# Patient Record
Sex: Female | Born: 1945
Health system: Southern US, Community
[De-identification: ages and names within clinical notes are randomized; demographics above are authoritative.]

## PROBLEM LIST (undated history)

## (undated) ENCOUNTER — Inpatient Hospital Stay: Discharge status: Absent without leave | Payer: Self-pay

## (undated) DIAGNOSIS — I10 Essential (primary) hypertension: Secondary | ICD-10-CM

## (undated) DIAGNOSIS — E7800 Pure hypercholesterolemia, unspecified: Secondary | ICD-10-CM

## (undated) DIAGNOSIS — N189 Chronic kidney disease, unspecified: Secondary | ICD-10-CM

## (undated) DIAGNOSIS — M810 Age-related osteoporosis without current pathological fracture: Secondary | ICD-10-CM

## (undated) DIAGNOSIS — J449 Chronic obstructive pulmonary disease, unspecified: Secondary | ICD-10-CM

## (undated) HISTORY — PX: ABDOMINAL HYSTERECTOMY: SHX81

## (undated) HISTORY — PX: BREAST BIOPSY: SHX20

## (undated) HISTORY — PX: BUNIONECTOMY: SHX129

## (undated) HISTORY — PX: OTHER SURGICAL HISTORY: SHX169

## (undated) HISTORY — PX: MULTIPLE TOOTH EXTRACTIONS: SHX2053

---

## 1998-09-14 ENCOUNTER — Ambulatory Visit (HOSPITAL_COMMUNITY): Admission: RE | Admit: 1998-09-14 | Discharge: 1998-09-14 | Payer: Self-pay | Admitting: Family Medicine

## 1998-09-17 ENCOUNTER — Ambulatory Visit (HOSPITAL_COMMUNITY): Admission: RE | Admit: 1998-09-17 | Discharge: 1998-09-17 | Payer: Self-pay | Admitting: Family Medicine

## 2000-10-02 ENCOUNTER — Other Ambulatory Visit: Admission: RE | Admit: 2000-10-02 | Discharge: 2000-10-02 | Payer: Self-pay | Admitting: Obstetrics and Gynecology

## 2002-03-25 ENCOUNTER — Other Ambulatory Visit: Admission: RE | Admit: 2002-03-25 | Discharge: 2002-03-25 | Payer: Self-pay | Admitting: Obstetrics and Gynecology

## 2004-06-28 ENCOUNTER — Other Ambulatory Visit: Admission: RE | Admit: 2004-06-28 | Discharge: 2004-06-28 | Payer: Self-pay | Admitting: Obstetrics and Gynecology

## 2004-07-05 ENCOUNTER — Emergency Department (HOSPITAL_COMMUNITY): Admission: EM | Admit: 2004-07-05 | Discharge: 2004-07-05 | Payer: Self-pay | Admitting: Emergency Medicine

## 2005-01-11 ENCOUNTER — Ambulatory Visit (HOSPITAL_COMMUNITY): Admission: RE | Admit: 2005-01-11 | Discharge: 2005-01-11 | Payer: Self-pay | Admitting: Gastroenterology

## 2005-08-31 ENCOUNTER — Other Ambulatory Visit: Admission: RE | Admit: 2005-08-31 | Discharge: 2005-08-31 | Payer: Self-pay | Admitting: Obstetrics and Gynecology

## 2008-09-18 ENCOUNTER — Encounter: Admission: RE | Admit: 2008-09-18 | Discharge: 2008-09-18 | Payer: Self-pay | Admitting: Internal Medicine

## 2011-05-06 NOTE — Op Note (Signed)
NAME:  Katie Jackson, Katie Jackson NO.:  [id number]   MEDICAL RECORD NO.:  [id number]          PATIENT TYPE:  AMB   LOCATION:  ENDO                         FACILITY:  [facility]   PHYSICIAN:  Danise Edge, M.D.   DATE OF BIRTH:  1946-09-19   DATE OF PROCEDURE:  01/11/2005  DATE OF DISCHARGE:                                 OPERATIVE REPORT   PROCEDURE INDICATION:  Ms. Nikaela Coyne is a 65 year old female born 10-11-1946. Ms. Kopecky is scheduled to undergo her first screening colonoscopy  with polypectomy to prevent colon cancer.   MEDICATIONS ALLERGIES:  None.   CHRONIC MEDICATIONS:  Calcium with vitamin D.   PAST MEDICAL HISTORY:  1.  Hypercholesterolemia.  2.  Hypertension.  3.  Osteopenia.  4.  Hysterectomy with bilateral salpingo-oophorectomy.  5.  Appendectomy   FAMILY HISTORY:  Father died prostate cancer age 67. Mother died pancreatic  cancer age 41. Three sisters have thyroid problems. One sister has  hypertension.   HABITS:  Ms. Hantz does not smoke cigarettes and consumes alcohol in  moderation   ENDOSCOPIST:  Danise Edge, M.D.   PREMEDICATION:  Versed 5 mg, Demerol 50 mg.   PROCEDURE:  After obtaining informed consent, Ms. Rebert was placed in the  left lateral decubitus position. I administered intravenous Demerol and  intravenous Versed to achieve conscious sedation for the procedure. The  patient's blood pressure, oxygen saturation and cardiac rhythm were  monitored throughout the procedure and documented in the medical record.   Anal inspection and digital rectal exam were normal. The Olympus adjustable  pediatric colonoscope was introduced into the rectum and advanced to the  cecum. Colonic preparation exam today was excellent.   Rectum:  Normal.   Sigmoid colon and descending colon:  Normal.   Splenic flexure:  Normal.   Transverse colon: Normal.   Hepatic flexure: Normal.   Descending colon:  Normal.   Cecum and ileocecal valve:   Normal.   ASSESSMENT:  Normal screening proctocolonoscopy to the cecum. No endoscopic  evidence for the presence of colorectal neoplasia.      MJ/MEDQ  D:  01/11/2005  T:  01/11/2005  Job:  [id number]   cc:   Georgann Housekeeper, MD  [address]  [address]  [place] [address]  Fax: [phone]

## 2015-06-25 ENCOUNTER — Other Ambulatory Visit: Payer: Self-pay | Admitting: Internal Medicine

## 2015-06-25 DIAGNOSIS — Z1231 Encounter for screening mammogram for malignant neoplasm of breast: Secondary | ICD-10-CM

## 2015-07-27 ENCOUNTER — Ambulatory Visit
Admission: RE | Admit: 2015-07-27 | Discharge: 2015-07-27 | Disposition: A | Discharge status: Home / Self Care | Payer: Medicare Other | Source: Ambulatory Visit | Attending: Internal Medicine | Admitting: Internal Medicine

## 2015-07-27 DIAGNOSIS — Z1231 Encounter for screening mammogram for malignant neoplasm of breast: Secondary | ICD-10-CM

## 2015-07-29 ENCOUNTER — Other Ambulatory Visit: Payer: Self-pay | Admitting: Internal Medicine

## 2015-07-29 DIAGNOSIS — R928 Other abnormal and inconclusive findings on diagnostic imaging of breast: Secondary | ICD-10-CM

## 2015-08-12 ENCOUNTER — Other Ambulatory Visit: Payer: Self-pay | Admitting: Gastroenterology

## 2015-08-14 ENCOUNTER — Ambulatory Visit
Admission: RE | Admit: 2015-08-14 | Discharge: 2015-08-14 | Disposition: A | Discharge status: Home / Self Care | Payer: Medicare Other | Source: Ambulatory Visit | Attending: Internal Medicine | Admitting: Internal Medicine

## 2015-08-14 ENCOUNTER — Other Ambulatory Visit: Payer: Self-pay | Admitting: Internal Medicine

## 2015-08-14 DIAGNOSIS — R928 Other abnormal and inconclusive findings on diagnostic imaging of breast: Secondary | ICD-10-CM

## 2015-08-19 ENCOUNTER — Other Ambulatory Visit: Payer: Self-pay | Admitting: Internal Medicine

## 2015-08-19 DIAGNOSIS — R928 Other abnormal and inconclusive findings on diagnostic imaging of breast: Secondary | ICD-10-CM

## 2015-08-20 ENCOUNTER — Ambulatory Visit
Admission: RE | Admit: 2015-08-20 | Discharge: 2015-08-20 | Disposition: A | Discharge status: Home / Self Care | Payer: Medicare Other | Source: Ambulatory Visit | Attending: Internal Medicine | Admitting: Internal Medicine

## 2015-08-20 DIAGNOSIS — R928 Other abnormal and inconclusive findings on diagnostic imaging of breast: Secondary | ICD-10-CM

## 2015-10-13 ENCOUNTER — Ambulatory Visit (HOSPITAL_COMMUNITY): Admission: RE | Admit: 2015-10-13 | Payer: Medicare Other | Source: Ambulatory Visit | Admitting: Gastroenterology

## 2015-10-13 ENCOUNTER — Encounter (HOSPITAL_COMMUNITY): Admission: RE | Payer: Self-pay | Source: Ambulatory Visit

## 2015-10-13 SURGERY — COLONOSCOPY WITH PROPOFOL
Anesthesia: Monitor Anesthesia Care

## 2016-03-08 ENCOUNTER — Other Ambulatory Visit: Payer: Self-pay | Admitting: Internal Medicine

## 2016-03-08 DIAGNOSIS — N63 Unspecified lump in unspecified breast: Secondary | ICD-10-CM

## 2016-03-14 ENCOUNTER — Ambulatory Visit
Admission: RE | Admit: 2016-03-14 | Discharge: 2016-03-14 | Disposition: A | Discharge status: Home / Self Care | Payer: Medicare Other | Source: Ambulatory Visit | Attending: Internal Medicine | Admitting: Internal Medicine

## 2016-03-14 DIAGNOSIS — N63 Unspecified lump in unspecified breast: Secondary | ICD-10-CM

## 2016-05-18 ENCOUNTER — Other Ambulatory Visit: Payer: Self-pay | Admitting: Gastroenterology

## 2016-06-23 ENCOUNTER — Other Ambulatory Visit: Payer: Self-pay | Admitting: Nurse Practitioner

## 2016-06-23 ENCOUNTER — Ambulatory Visit
Admission: RE | Admit: 2016-06-23 | Discharge: 2016-06-23 | Disposition: A | Discharge status: Home / Self Care | Payer: Medicare Other | Source: Ambulatory Visit | Attending: Nurse Practitioner | Admitting: Nurse Practitioner

## 2016-06-23 DIAGNOSIS — M25561 Pain in right knee: Secondary | ICD-10-CM

## 2016-08-02 ENCOUNTER — Encounter (HOSPITAL_COMMUNITY): Admission: RE | Payer: Self-pay | Source: Ambulatory Visit

## 2016-08-02 ENCOUNTER — Ambulatory Visit (HOSPITAL_COMMUNITY): Admission: RE | Admit: 2016-08-02 | Payer: Medicare Other | Source: Ambulatory Visit | Admitting: Gastroenterology

## 2016-08-02 SURGERY — COLONOSCOPY WITH PROPOFOL
Anesthesia: Monitor Anesthesia Care

## 2016-08-30 ENCOUNTER — Other Ambulatory Visit: Payer: Self-pay | Admitting: Internal Medicine

## 2016-08-30 ENCOUNTER — Ambulatory Visit
Admission: RE | Admit: 2016-08-30 | Discharge: 2016-08-30 | Disposition: A | Discharge status: Home / Self Care | Payer: Medicare Other | Source: Ambulatory Visit | Attending: Internal Medicine | Admitting: Internal Medicine

## 2016-08-30 DIAGNOSIS — M545 Low back pain: Secondary | ICD-10-CM

## 2017-11-20 DIAGNOSIS — N390 Urinary tract infection, site not specified: Secondary | ICD-10-CM | POA: Diagnosis not present

## 2018-03-14 DIAGNOSIS — H2513 Age-related nuclear cataract, bilateral: Secondary | ICD-10-CM | POA: Diagnosis not present

## 2018-03-14 DIAGNOSIS — B029 Zoster without complications: Secondary | ICD-10-CM | POA: Diagnosis not present

## 2018-03-26 DIAGNOSIS — H2513 Age-related nuclear cataract, bilateral: Secondary | ICD-10-CM | POA: Diagnosis not present

## 2018-03-26 DIAGNOSIS — H5703 Miosis: Secondary | ICD-10-CM | POA: Diagnosis not present

## 2018-04-02 DIAGNOSIS — H2511 Age-related nuclear cataract, right eye: Secondary | ICD-10-CM | POA: Diagnosis not present

## 2018-04-12 DIAGNOSIS — H2512 Age-related nuclear cataract, left eye: Secondary | ICD-10-CM | POA: Diagnosis not present

## 2018-04-16 DIAGNOSIS — H2512 Age-related nuclear cataract, left eye: Secondary | ICD-10-CM | POA: Diagnosis not present

## 2018-05-07 DIAGNOSIS — F0390 Unspecified dementia without behavioral disturbance: Secondary | ICD-10-CM | POA: Diagnosis not present

## 2018-05-07 DIAGNOSIS — I10 Essential (primary) hypertension: Secondary | ICD-10-CM | POA: Diagnosis not present

## 2018-05-07 DIAGNOSIS — E782 Mixed hyperlipidemia: Secondary | ICD-10-CM | POA: Diagnosis not present

## 2018-05-07 DIAGNOSIS — N183 Chronic kidney disease, stage 3 (moderate): Secondary | ICD-10-CM | POA: Diagnosis not present

## 2018-05-08 ENCOUNTER — Other Ambulatory Visit: Payer: Self-pay | Admitting: Internal Medicine

## 2018-05-08 DIAGNOSIS — F0390 Unspecified dementia without behavioral disturbance: Secondary | ICD-10-CM

## 2018-05-10 ENCOUNTER — Ambulatory Visit
Admission: RE | Admit: 2018-05-10 | Discharge: 2018-05-10 | Disposition: A | Discharge status: Home / Self Care | Payer: Medicare Other | Source: Ambulatory Visit | Attending: Internal Medicine | Admitting: Internal Medicine

## 2018-05-10 DIAGNOSIS — F0390 Unspecified dementia without behavioral disturbance: Secondary | ICD-10-CM

## 2018-05-10 DIAGNOSIS — R413 Other amnesia: Secondary | ICD-10-CM | POA: Diagnosis not present

## 2018-05-22 DIAGNOSIS — N390 Urinary tract infection, site not specified: Secondary | ICD-10-CM | POA: Diagnosis not present

## 2018-05-22 DIAGNOSIS — R413 Other amnesia: Secondary | ICD-10-CM | POA: Diagnosis not present

## 2018-05-23 ENCOUNTER — Encounter: Payer: Self-pay | Admitting: Neurology

## 2018-06-10 ENCOUNTER — Other Ambulatory Visit: Payer: Self-pay

## 2018-06-10 ENCOUNTER — Encounter (HOSPITAL_COMMUNITY): Payer: Self-pay | Admitting: Emergency Medicine

## 2018-06-10 ENCOUNTER — Emergency Department (HOSPITAL_COMMUNITY)
Admission: EM | Admit: 2018-06-10 | Discharge: 2018-06-10 | Disposition: A | Discharge status: Home / Self Care | Payer: Medicare Other | Attending: Emergency Medicine | Admitting: Emergency Medicine

## 2018-06-10 DIAGNOSIS — Z7982 Long term (current) use of aspirin: Secondary | ICD-10-CM | POA: Insufficient documentation

## 2018-06-10 DIAGNOSIS — I10 Essential (primary) hypertension: Secondary | ICD-10-CM | POA: Diagnosis not present

## 2018-06-10 DIAGNOSIS — R21 Rash and other nonspecific skin eruption: Secondary | ICD-10-CM | POA: Diagnosis not present

## 2018-06-10 DIAGNOSIS — Z87891 Personal history of nicotine dependence: Secondary | ICD-10-CM | POA: Insufficient documentation

## 2018-06-10 DIAGNOSIS — Z79899 Other long term (current) drug therapy: Secondary | ICD-10-CM | POA: Diagnosis not present

## 2018-06-10 HISTORY — DX: Essential (primary) hypertension: I10

## 2018-06-10 HISTORY — DX: Pure hypercholesterolemia, unspecified: E78.00

## 2018-06-10 MED ORDER — PREDNISONE 10 MG (21) PO TBPK: ORAL_TABLET | Freq: Every day | ORAL | 0 refills | Status: DC

## 2018-06-10 NOTE — ED Provider Notes (Signed)
[facility] EMERGENCY DEPARTMENT Provider Note   CSN: [id number] Arrival date & time: 06/10/18  1510     History   Chief Complaint Chief Complaint  Patient presents with  . Rash    HPI Katie Jackson is a 72 y.o. female.  72yo female presents with rash to the right side face, trunk, bilateral arms and 3rd finger, onset yesterday. Patient states the areas appear as small fluid filled bumps that open/drain and dry up. Reports pain and itching associated with the rash.  Patient has not tried putting anything on her rash or taking anything for it.  Denies any changes to her medications or detergents.  Patient lives in an apartment and does not have pets, denies exposure to plants.  No other complaints or concerns.  Patient's family member is here to assist her with her history, reports patient has dementia and may not be certain on all the details of her presentation today.     Past Medical History:  Diagnosis Date  . High cholesterol   . Hypertension     There are no active problems to display for this patient.   Past Surgical History:  Procedure Laterality Date  . cataract Bilateral      OB History   None      Home Medications    Prior to Admission medications   Medication Sig Start Date End Date Taking? Authorizing Provider  aspirin 325 MG tablet Take 325 mg by mouth daily as needed for headache.    [provider]  Calcium Carb-Cholecalciferol (CALCIUM 600/VITAMIN D3 PO) Take 1 tablet by mouth daily.    [provider]  lisinopril (PRINIVIL,ZESTRIL) 20 MG tablet Take 20 mg by mouth every morning.    [provider]  lovastatin (MEVACOR) 20 MG tablet Take 20 mg by mouth at bedtime.    [provider]  predniSONE (STERAPRED UNI-PAK 21 TAB) 10 MG (21) TBPK tablet Take by mouth daily. Take 6 tabs by mouth daily  for 2 days, then 5 tabs for 2 days, then 4 tabs for 2 days, then 3 tabs for 2 days, 2 tabs for 2 days,  then 1 tab by mouth daily for 2 days 06/10/18   Tacy Learn, PA-C    Family History No family history on file.  Social History Social History   Tobacco Use  . Smoking status: Former Research scientist (life sciences)  . Smokeless tobacco: Never Used  Substance Use Topics  . Alcohol use: Not Currently  . Drug use: Never     Allergies   Patient has no known allergies.   Review of Systems Review of Systems  Constitutional: Negative for fever.  Eyes: Negative for pain.  Respiratory: Negative for shortness of breath.   Cardiovascular: Negative for chest pain.  Gastrointestinal: Negative for abdominal pain and vomiting.  Musculoskeletal: Negative for arthralgias, joint swelling and myalgias.  Skin: Positive for rash. Negative for wound.  Allergic/Immunologic: Negative for immunocompromised state.  Neurological: Negative for dizziness and weakness.  Hematological: Negative for adenopathy. Does not bruise/bleed easily.  All other systems reviewed and are negative.    Physical Exam Updated Vital Signs BP (!) 144/69 (BP Location: Right Arm)   Pulse 63   Temp 98.3 F (36.8 C) (Oral)   Resp 16   Ht 5\' 3"  (1.6 m)   Wt 54.9 kg (121 lb)   SpO2 98%   BMI 21.43 kg/m   Physical Exam  Constitutional: She is oriented to person, place, and  time. She appears well-developed and well-nourished. No distress.  HENT:  Head: Normocephalic and atraumatic.  Right Ear: External ear normal.  Left Ear: External ear normal.  Mouth/Throat: Oropharynx is clear and moist.  Eyes: Conjunctivae are normal.  Neck: Neck supple.  Cardiovascular: Normal rate, regular rhythm, normal heart sounds and intact distal pulses.  No murmur heard. Pulmonary/Chest: Effort normal and breath sounds normal. No respiratory distress.  Musculoskeletal: She exhibits no edema or tenderness.  Neurological: She is alert and oriented to person, place, and time.  Skin: Skin is warm and dry. Rash noted. She is not diaphoretic.  Maculopapular  rash to right side of face.  There are a few papules noted to her trunk.  Vesicles to right forearm and a cluster.  Vesicles noted to left third finger.  Excoriated lesions noted to left forearm.  Psychiatric: She has a normal mood and affect. Her behavior is normal.  Nursing note and vitals reviewed.    ED Treatments / Results  Labs (all labs ordered are listed, but only abnormal results are displayed) Labs Reviewed - No data to display  EKG None  Radiology No results found.  Procedures Procedures (including critical care time)  Medications Ordered in ED Medications - No data to display   Initial Impression / Assessment and Plan / ED Course  I have reviewed the triage vital signs and the nursing notes.  Pertinent labs & imaging results that were available during my care of the patient were reviewed by me and considered in my medical decision making (see chart for details).  Clinical Course as of Jun 10 1644  Sun Jun 11, 6143  5744 72 year old female presents with complaint of rash onset yesterday.  Patient associates itching and pain with the rash.  No identifiable causes to her rash.  On exam she has a maculopapular rash with occasional vesicles.  Rash does not appear consistent with shingles due to distribution.  Consider contact dermatitis.  Patient will be discharged home with prednisone Dosepak.  Recommend that she recheck with her PCP or dermatology this week.  She is Artie planning to see dermatology for a concerning skin lesion on her chest.   [LM]    Clinical Course User Index [LM] Tacy Learn, PA-C    Final Clinical Impressions(s) / ED Diagnoses   Final diagnoses:  Rash    ED Discharge Orders        Ordered    predniSONE (STERAPRED UNI-PAK 21 TAB) 10 MG (21) TBPK tablet  Daily     06/10/18 1615       Tacy Learn, PA-C 06/10/18 1645    Davonna Belling, MD 06/12/18 1450

## 2018-06-10 NOTE — ED Triage Notes (Signed)
C/o rash to R side of face, neck, and bilateral arms since waking up around 2pm.  Swelling to R side of face.  States she had a rash 1 month ago that looked different and took antibiotic and it resolved.

## 2018-06-10 NOTE — Discharge Instructions (Addendum)
Take prednisone as prescribed and complete the full course.  Follow-up with your primary care doctor this week for recheck.  Schedule an appointment with your dermatologist as discussed.  Return to the ER if symptoms worsen or progress.

## 2018-06-13 DIAGNOSIS — R238 Other skin changes: Secondary | ICD-10-CM | POA: Diagnosis not present

## 2018-06-13 DIAGNOSIS — L989 Disorder of the skin and subcutaneous tissue, unspecified: Secondary | ICD-10-CM | POA: Diagnosis not present

## 2018-06-13 DIAGNOSIS — R21 Rash and other nonspecific skin eruption: Secondary | ICD-10-CM | POA: Diagnosis not present

## 2018-06-18 DIAGNOSIS — D225 Melanocytic nevi of trunk: Secondary | ICD-10-CM | POA: Diagnosis not present

## 2018-06-18 DIAGNOSIS — L818 Other specified disorders of pigmentation: Secondary | ICD-10-CM | POA: Diagnosis not present

## 2018-06-18 DIAGNOSIS — B0089 Other herpesviral infection: Secondary | ICD-10-CM | POA: Diagnosis not present

## 2018-06-18 DIAGNOSIS — C44529 Squamous cell carcinoma of skin of other part of trunk: Secondary | ICD-10-CM | POA: Diagnosis not present

## 2018-06-18 DIAGNOSIS — L820 Inflamed seborrheic keratosis: Secondary | ICD-10-CM | POA: Diagnosis not present

## 2018-07-16 DIAGNOSIS — Z08 Encounter for follow-up examination after completed treatment for malignant neoplasm: Secondary | ICD-10-CM | POA: Diagnosis not present

## 2018-07-16 DIAGNOSIS — Z85828 Personal history of other malignant neoplasm of skin: Secondary | ICD-10-CM | POA: Diagnosis not present

## 2018-07-16 DIAGNOSIS — L821 Other seborrheic keratosis: Secondary | ICD-10-CM | POA: Diagnosis not present

## 2018-08-02 ENCOUNTER — Encounter: Payer: Self-pay | Admitting: Neurology

## 2018-08-02 ENCOUNTER — Ambulatory Visit (INDEPENDENT_AMBULATORY_CARE_PROVIDER_SITE_OTHER): Payer: Medicare HMO | Admitting: Neurology

## 2018-08-02 ENCOUNTER — Other Ambulatory Visit: Payer: Self-pay

## 2018-08-02 VITALS — BP 98/56 | HR 79 | Ht 62.0 in | Wt 114.0 lb

## 2018-08-02 DIAGNOSIS — G301 Alzheimer's disease with late onset: Secondary | ICD-10-CM | POA: Diagnosis not present

## 2018-08-02 DIAGNOSIS — F0281 Dementia in other diseases classified elsewhere with behavioral disturbance: Secondary | ICD-10-CM | POA: Diagnosis not present

## 2018-08-02 DIAGNOSIS — F0280 Dementia in other diseases classified elsewhere without behavioral disturbance: Secondary | ICD-10-CM

## 2018-08-02 DIAGNOSIS — F02818 Dementia in other diseases classified elsewhere, unspecified severity, with other behavioral disturbance: Secondary | ICD-10-CM

## 2018-08-02 HISTORY — DX: Dementia in other diseases classified elsewhere, unspecified severity, without behavioral disturbance, psychotic disturbance, mood disturbance, and anxiety: F02.80

## 2018-08-02 MED ORDER — ESCITALOPRAM OXALATE 10 MG PO TABS: 10.0000 mg | ORAL_TABLET | Freq: Every day | ORAL | 11 refills | Status: DC

## 2018-08-02 MED ORDER — DONEPEZIL HCL 10 MG PO TABS: 10.0000 mg | ORAL_TABLET | Freq: Every day | ORAL | 11 refills | Status: DC

## 2018-08-02 NOTE — Progress Notes (Signed)
NEUROLOGY CONSULTATION NOTE  Katie Jackson MRN: [id number] DOB: 1946-09-25  Referring provider: Dr. Wenda Jackson Primary care provider: Dr. Wenda Jackson  Reason for consult:  dementia  Dear Dr Katie Jackson:  Thank you for your kind referral of Katie Jackson for consultation of the above symptoms. Although her history is well known to you, please allow me to reiterate it for the purpose of our medical record. The patient was accompanied to the clinic by her daughter and sister who also provide collateral information. Records and images were personally reviewed where available.  HISTORY OF PRESENT ILLNESS: This is a 72 year old right-handed woman with a history of hypertension, hyperlipidemia, presenting for evaluation of dementia. She thinks her memory is "pretty well." Her daughter started noticing memory changes a couple of years ago, worse the past year. She lives alone in a senior citizen apartment complex. Her daughter reports difficulties with medications. She was having recurrent UTIs, she said she was taking them, but when she had another UTI in June 2019, her daughter found a full bottle of antibiotic from December 2018. Her daughter started fixing her pillbox but she still does not take it right. Her daughter reports that she has been a victim of several scams, she has joined [facility] and does not know what the policy is for. She joined [employer], her daughter discontinued the membership, then she joined again. She was forgetting to pay her bills and then overpaying 2 months ago. She has mail everywhere. Her daughter has been trying to help her for the past 2-3 months but she would fight her daughter. Her daughter is now POA. She does not cook and goes out twice a day to buy food. She continues to drive and denies getting lost driving. She drives for her neighbors and denies any car accidents. 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 She states she is not doing this anymore. She is independent with dressing and bathing but she kept having a rash on her face, which they think is due to not bathing and washing her hair. She hangs dirty clothes back in the closet. She has been putting on her eyeliner thickly on her lower lid for the past 2 years, which her family reports is a change. Her daughter has also noticed increased irritability, she can get agitated/aggressive quickly, which is not typical for her. No paranoia or hallucinations. No family history of dementia. She denies any history of significant head injuries or alcohol use. She is taking Donepezil 5mg  daily without side effects. She denies any headaches, dizziness, diplopia, dysarthria, dysphagia, neck/back pain, focal numbness/tingling/weakness, bowel/bladder dysfunction. No anosmia, tremors, no falls.   She had a head CT without contrast done 04/2018 which did not show any acute changes. There was mild diffuse atrophy and bilateral hippocampal atrophy noted.   PAST MEDICAL HISTORY: Past Medical History:  Diagnosis Date  . High cholesterol   . Hypertension     PAST SURGICAL HISTORY: Past Surgical History:  Procedure Laterality Date  . cataract Bilateral     MEDICATIONS: Current Outpatient Medications on File Prior to Visit  Medication Sig Dispense Refill  . Calcium Carb-Cholecalciferol (CALCIUM 600/VITAMIN D3 PO) Take 1 tablet by mouth daily.    [name] donepezil (ARICEPT) 5 MG tablet Take 5 mg by mouth daily.    [name] lisinopril (PRINIVIL,ZESTRIL) 20 MG tablet  Take 20 mg by mouth every morning.    . lovastatin (MEVACOR) 20 MG tablet Take 20 mg by mouth at bedtime.    . valACYclovir (VALTREX) 1000 MG tablet Take 1,000 mg by mouth as needed (as needed for facial breakout).     No current facility-administered medications on file prior to visit.     ALLERGIES: No Known Allergies  FAMILY HISTORY: No family history on  file.  SOCIAL HISTORY: Social History   Socioeconomic History  . Marital status: Single    Spouse name: Not on file  . Number of children: Not on file  . Years of education: Not on file  . Highest education level: Not on file  Occupational History  . Not on file  Social Needs  . Financial resource strain: Not on file  . Food insecurity:    Worry: Not on file    Inability: Not on file  . Transportation needs:    Medical: Not on file    Non-medical: Not on file  Tobacco Use  . Smoking status: Former Research scientist (life sciences)  . Smokeless tobacco: Never Used  Substance and Sexual Activity  . Alcohol use: Not Currently  . Drug use: Never  . Sexual activity: Not on file  Lifestyle  . Physical activity:    Days per week: Not on file    Minutes per session: Not on file  . Stress: Not on file  Relationships  . Social connections:    Talks on phone: Not on file    Gets together: Not on file    Attends religious service: Not on file    Active member of club or organization: Not on file    Attends meetings of clubs or organizations: Not on file    Relationship status: Not on file  . Intimate partner violence:    Fear of current or ex partner: Not on file    Emotionally abused: Not on file    Physically abused: Not on file    Forced sexual activity: Not on file  Other Topics Concern  . 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     REVIEW OF SYSTEMS: Constitutional: No fevers, chills, or sweats, no generalized fatigue, change in appetite Eyes: No visual changes, double vision, eye pain Ear, nose and throat: No hearing loss, ear pain, nasal congestion, sore throat Cardiovascular: No chest pain, palpitations Respiratory:  No shortness of breath at rest or with exertion, wheezes GastrointestinaI: No nausea, vomiting, diarrhea, abdominal pain, fecal  incontinence Genitourinary:  No dysuria, urinary retention or frequency Musculoskeletal:  No neck pain, back pain Integumentary: No rash, pruritus, skin lesions Neurological: as above Psychiatric: No depression, insomnia, anxiety Endocrine: No palpitations, fatigue, diaphoresis, mood swings, change in appetite, change in weight, increased thirst Hematologic/Lymphatic:  No anemia, purpura, petechiae. Allergic/Immunologic: no itchy/runny eyes, nasal congestion, recent allergic reactions, rashes  PHYSICAL EXAM: Vitals:   08/02/18 1038  BP: (!) 98/56  Pulse: 79  SpO2: 98%   General: No acute distress Head:  Normocephalic/atraumatic Eyes: Fundoscopic exam shows bilateral sharp discs, no vessel changes, exudates, or hemorrhages Neck: supple, no paraspinal tenderness, full range of motion Back: No paraspinal tenderness Heart: regular rate and rhythm Lungs: Clear to auscultation bilaterally. Vascular: No carotid bruits. Skin/Extremities: No rash, no edema Neurological Exam: Mental status: alert and oriented to person, place, and  time, no dysarthria or aphasia, Fund of knowledge is appropriate.  Recent and remote memory are impaired.  Attention and concentration are normal.    Able to name objects and repeat phrases.  Montreal Cognitive Assessment  08/02/2018  Visuospatial/ Executive (0/5) 5  Naming (0/3) 3  Attention: Read list of digits (0/2) 2  Attention: Read list of letters (0/1) 1  Attention: Serial 7 subtraction starting at 100 (0/3) 3  Language: Repeat phrase (0/2) 2  Language : Fluency (0/1) 0  Abstraction (0/2) 2  Delayed Recall (0/5) 3  Orientation (0/6) 6  Total 27   Cranial nerves: CN I: not tested CN II: pupils equal, round and reactive to light, visual fields intact, fundi unremarkable. CN III, IV, VI:  full range of motion, no nystagmus, no ptosis CN V: facial sensation intact CN VII: upper and lower face symmetric CN VIII: hearing intact to finger rub CN IX, X:  gag intact, uvula midline CN XI: sternocleidomastoid and trapezius muscles intact CN XII: tongue midline Bulk & Tone: normal, no fasciculations. Motor: 5/5 throughout with no pronator drift. Sensation: intact to light touch, cold, pin, vibration and joint position sense.  No extinction to double simultaneous stimulation.  Romberg test negative Deep Tendon Reflexes: +2 throughout, no ankle clonus Plantar responses: downgoing bilaterally Cerebellar: no incoordination on finger to nose, heel to shin. No dysdiadochokinesia Gait: narrow-based and steady, able to tandem walk adequately. Tremor: none  IMPRESSION: This is a 72 year old right-handed woman with a history of hypertension, hyperlipidemia, presenting for evaluation of worsening memory. She did quite well with MOCA testing, 27/30, however history suggestive of mild Alzheimer's disease. Brain imaging shows bilateral hippocampal atrophy. We discussed the diagnosis and increasing Donepezil to 10mg  daily. We also discussed behavioral changes that occur with dementia, she is agreeable to starting Lexapro 10mg  daily, side effects discussed. We had an extensive discussion about no further driving, which she was understandable unhappy about. She was encouraged to start looking into day programs and having a home aide to help with medications and hygiene/meals. We discussed the importance of control of vascular risk factors, physical exercise, and brain stimulation exercises for brain health. Follow-up in 6 months, they know to call for any changes.   Thank you for allowing me to participate in the care of this patient. Please do not hesitate to call for any questions or concerns.   Ellouise Newer, M.D.  CC: Dr. Lysle Jackson

## 2018-08-02 NOTE — Patient Instructions (Addendum)
1. Increase Donepezil to 10mg  daily 2. Start Lexapro 10mg  daily 3. We will get ou connected with [employer] through the [employer] to help with local resources for home aide and adult day programs 4. No further driving 5. Follow-up in 6 months, call for any changes  FALL PRECAUTIONS: Be cautious when walking. Scan the area for obstacles that may increase the risk of trips and falls. When getting up in the mornings, sit up at the edge of the bed for a few minutes before getting out of bed. Consider elevating the bed at the head end to avoid drop of blood pressure when getting up. Walk always in a well-lit room (use night lights in the walls). Avoid area rugs or power cords from appliances in the middle of the walkways. Use a walker or a cane if necessary and consider physical therapy for balance exercise. Get your eyesight checked regularly.  FINANCIAL OVERSIGHT: Supervision, especially oversight when making financial decisions or transactions is also recommended.  HOME SAFETY: Consider the safety of the kitchen when operating appliances like stoves, microwave oven, and blender. Consider having supervision and share cooking responsibilities until no longer able to participate in those. Accidents with firearms and other hazards in the house should be identified and addressed as well.  ABILITY TO BE LEFT ALONE: If patient is unable to contact 911 operator, consider using [employer], or when the need is there, arrange for someone to stay with patients. Smoking is a fire hazard, consider supervision or cessation. Risk of wandering should be assessed by caregiver and if detected at any point, supervision and safe proof recommendations should be instituted.  MEDICATION SUPERVISION: Inability to self-administer medication needs to be constantly addressed. Implement a mechanism to ensure safe administration of the medications.  RECOMMENDATIONS FOR ALL PATIENTS WITH MEMORY PROBLEMS: 1. Continue  to exercise (Recommend 30 minutes of walking everyday, or 3 hours every week) 2. Increase social interactions - continue going to [place] and enjoy social gatherings with friends and family 3. Eat healthy, avoid fried foods and eat more fruits and vegetables 4. Maintain adequate blood pressure, blood sugar, and blood cholesterol level. Reducing the risk of stroke and cardiovascular disease also helps promoting better memory. 5. Avoid stressful situations. Live a simple life and avoid aggravations. Organize your time and prepare for the next day in anticipation. 6. Sleep well, avoid any interruptions of sleep and avoid any distractions in the bedroom that may interfere with adequate sleep quality 7. Avoid sugar, avoid sweets as there is a strong link between excessive sugar intake, diabetes, and cognitive impairment The Mediterranean diet has been shown to help patients reduce the risk of progressive memory disorders and reduces cardiovascular risk. This includes eating fish, eat fruits and green leafy vegetables, nuts like almonds and hazelnuts, walnuts, and also use olive oil. Avoid fast foods and fried foods as much as possible. Avoid sweets and sugar as sugar use has been linked to worsening of memory function.  There is always a concern of gradual progression of memory problems. If this is the case, then we may need to adjust level of care according to patient needs. Support, both to the patient and caregiver, should then be put into place.

## 2018-08-09 ENCOUNTER — Encounter: Payer: Self-pay | Admitting: Neurology

## 2018-11-05 ENCOUNTER — Emergency Department (HOSPITAL_COMMUNITY): Payer: Medicare Other

## 2018-11-05 ENCOUNTER — Other Ambulatory Visit: Payer: Self-pay

## 2018-11-05 ENCOUNTER — Encounter (HOSPITAL_COMMUNITY): Payer: Self-pay | Admitting: *Deleted

## 2018-11-05 ENCOUNTER — Emergency Department (HOSPITAL_COMMUNITY)
Admission: EM | Admit: 2018-11-05 | Discharge: 2018-11-05 | Disposition: A | Discharge status: Home / Self Care | Payer: Medicare Other | Attending: Emergency Medicine | Admitting: Emergency Medicine

## 2018-11-05 DIAGNOSIS — Z79899 Other long term (current) drug therapy: Secondary | ICD-10-CM | POA: Diagnosis not present

## 2018-11-05 DIAGNOSIS — Y998 Other external cause status: Secondary | ICD-10-CM | POA: Diagnosis not present

## 2018-11-05 DIAGNOSIS — Y92009 Unspecified place in unspecified non-institutional (private) residence as the place of occurrence of the external cause: Secondary | ICD-10-CM

## 2018-11-05 DIAGNOSIS — F0390 Unspecified dementia without behavioral disturbance: Secondary | ICD-10-CM | POA: Diagnosis not present

## 2018-11-05 DIAGNOSIS — Z87891 Personal history of nicotine dependence: Secondary | ICD-10-CM | POA: Insufficient documentation

## 2018-11-05 DIAGNOSIS — W19XXXA Unspecified fall, initial encounter: Secondary | ICD-10-CM | POA: Insufficient documentation

## 2018-11-05 DIAGNOSIS — Y92019 Unspecified place in single-family (private) house as the place of occurrence of the external cause: Secondary | ICD-10-CM | POA: Insufficient documentation

## 2018-11-05 DIAGNOSIS — R42 Dizziness and giddiness: Secondary | ICD-10-CM | POA: Insufficient documentation

## 2018-11-05 DIAGNOSIS — Y9389 Activity, other specified: Secondary | ICD-10-CM | POA: Diagnosis not present

## 2018-11-05 DIAGNOSIS — M25561 Pain in right knee: Secondary | ICD-10-CM | POA: Insufficient documentation

## 2018-11-05 DIAGNOSIS — S0990XA Unspecified injury of head, initial encounter: Secondary | ICD-10-CM | POA: Diagnosis present

## 2018-11-05 DIAGNOSIS — I10 Essential (primary) hypertension: Secondary | ICD-10-CM | POA: Diagnosis not present

## 2018-11-05 DIAGNOSIS — N390 Urinary tract infection, site not specified: Secondary | ICD-10-CM | POA: Diagnosis not present

## 2018-11-05 LAB — CBC
HEMATOCRIT: 42.9 % (ref 36.0–46.0)
Hemoglobin: 13.4 g/dL (ref 12.0–15.0)
MCH: 27.9 pg (ref 26.0–34.0)
MCHC: 31.2 g/dL (ref 30.0–36.0)
MCV: 89.4 fL (ref 80.0–100.0)
NRBC: 0 % (ref 0.0–0.2)
Platelets: 184 10*3/uL (ref 150–400)
RBC: 4.8 MIL/uL (ref 3.87–5.11)
RDW: 12.1 % (ref 11.5–15.5)
WBC: 5.2 10*3/uL (ref 4.0–10.5)

## 2018-11-05 LAB — DIFFERENTIAL
Abs Immature Granulocytes: 0.01 10*3/uL (ref 0.00–0.07)
Basophils Absolute: 0.1 10*3/uL (ref 0.0–0.1)
Basophils Relative: 1 %
Eosinophils Absolute: 0 10*3/uL (ref 0.0–0.5)
Eosinophils Relative: 1 %
Immature Granulocytes: 0 %
Lymphocytes Relative: 32 %
Lymphs Abs: 1.7 10*3/uL (ref 0.7–4.0)
Monocytes Absolute: 0.4 10*3/uL (ref 0.1–1.0)
Monocytes Relative: 7 %
Neutro Abs: 3.1 10*3/uL (ref 1.7–7.7)
Neutrophils Relative %: 59 %

## 2018-11-05 LAB — URINALYSIS, ROUTINE W REFLEX MICROSCOPIC
Bilirubin Urine: NEGATIVE
Glucose, UA: NEGATIVE mg/dL
Ketones, ur: 20 mg/dL — AB
Nitrite: POSITIVE — AB
Protein, ur: NEGATIVE mg/dL
Specific Gravity, Urine: 1.014 (ref 1.005–1.030)
pH: 6 (ref 5.0–8.0)

## 2018-11-05 LAB — COMPREHENSIVE METABOLIC PANEL
ALT: 11 U/L (ref 0–44)
AST: 21 U/L (ref 15–41)
Albumin: 4.2 g/dL (ref 3.5–5.0)
Alkaline Phosphatase: 43 U/L (ref 38–126)
Anion gap: 8 (ref 5–15)
BUN: 11 mg/dL (ref 8–23)
CO2: 27 mmol/L (ref 22–32)
Calcium: 9.6 mg/dL (ref 8.9–10.3)
Chloride: 103 mmol/L (ref 98–111)
Creatinine, Ser: 1.07 mg/dL — ABNORMAL HIGH (ref 0.44–1.00)
GFR calc Af Amer: 58 mL/min — ABNORMAL LOW (ref >60)
GFR calc non Af Amer: 50 mL/min — ABNORMAL LOW (ref >60)
Glucose, Bld: 93 mg/dL (ref 70–99)
Potassium: 3.8 mmol/L (ref 3.5–5.1)
Sodium: 138 mmol/L (ref 135–145)
Total Bilirubin: 0.8 mg/dL (ref 0.3–1.2)
Total Protein: 6.9 g/dL (ref 6.5–8.1)

## 2018-11-05 LAB — I-STAT TROPONIN, ED: Troponin i, poc: 0.01 ng/mL (ref 0.00–0.08)

## 2018-11-05 MED ORDER — CEPHALEXIN 250 MG PO CAPS: 250.0000 mg | ORAL_CAPSULE | Freq: Four times a day (QID) | ORAL | 0 refills | Status: DC

## 2018-11-05 MED ORDER — CEPHALEXIN 250 MG PO CAPS
500.0000 mg | ORAL_CAPSULE | Freq: Once | ORAL | Status: AC
  Administered 2018-11-05: 500 mg via ORAL
  Filled 2018-11-05: qty 2

## 2018-11-05 NOTE — ED Notes (Signed)
Pt given turkey sandwich and water

## 2018-11-05 NOTE — ED Notes (Signed)
Pt transported to CT ?

## 2018-11-05 NOTE — ED Notes (Signed)
Pt attempting to void on bedpan.

## 2018-11-05 NOTE — Discharge Instructions (Signed)
Your fall was possibly caused by urinary tract infection.  We have begun treatment for urinary tract infection that should.  Pick up the prescription tomorrow that was sent to your pharmacy.  Start taking it in the morning.  Make sure that you are eating 3 meals a day and drinking plenty of fluids to help make you stronger.  Return here if needed for problems.

## 2018-11-05 NOTE — ED Notes (Signed)
Discharge instructions and prescription discussed with Pt. Pt verbalized understanding. Pt stable and ambulatory.    

## 2018-11-05 NOTE — ED Notes (Signed)
While ambulating Pt from wheelchair to bed (distance of 2.43ft) Pt experienced weakness and needed to be ambulated with assistance and support to bed.

## 2018-11-05 NOTE — ED Notes (Signed)
Pt was able to use bedpan to void; based on smell and appearance of urine, a urine culture was also collected and sent down to Main Lab.

## 2018-11-05 NOTE — ED Provider Notes (Signed)
[facility] EMERGENCY DEPARTMENT Provider Note   CSN: [id number] Arrival date & time: 11/05/18  1441     History   Chief Complaint Chief Complaint  Patient presents with  . Dizziness  . Fall    HPI Katie Jackson is a 72 y.o. female.  HPI   Elderly patient with severe dementia presents for evaluation of injuries from fall.  She reportedly hit her head.  She also injured her right knee.  Patient describes a dizzy feeling which preceded the fall and continued afterwards.  She denies neck pain or back pain.  There was no associated diaphoresis, nausea, vomiting, focal weakness or paresthesia.  She has had some mild dysuria recently, but no urinary frequency, or hematuria.  She is here with family members to assist with the history.  There are no other known modifying factors.  Past Medical History:  Diagnosis Date  . High cholesterol   . Hypertension     There are no active problems to display for this patient.   Past Surgical History:  Procedure Laterality Date  . cataract Bilateral      OB History   None      Home Medications    Prior to Admission medications   Medication Sig Start Date End Date Taking? Authorizing Provider  Calcium Carb-Cholecalciferol (CALCIUM 600/VITAMIN D3 PO) Take 1 tablet by mouth 2 (two) times daily with a meal.    Yes [provider]  donepezil (ARICEPT) 10 MG tablet Take 1 tablet (10 mg total) by mouth at bedtime. 08/02/18  Yes Cameron Sprang, MD  escitalopram (LEXAPRO) 10 MG tablet Take 1 tablet (10 mg total) by mouth at bedtime. 08/02/18  Yes Cameron Sprang, MD  ibuprofen (ADVIL,MOTRIN) 200 MG tablet Take 200 mg by mouth every 6 (six) hours as needed (for pain or headaches).   Yes [provider]  lisinopril (PRINIVIL,ZESTRIL) 20 MG tablet Take 20 mg by mouth every morning.   Yes [provider]  lovastatin (MEVACOR) 20 MG tablet Take 20 mg by mouth at bedtime.   Yes [provider]    valACYclovir (VALTREX) 1000 MG tablet Take 1,000 mg by mouth as needed (as needed for facial breakouts).    Yes [provider]    Family History History reviewed. No pertinent family history.  Social History Social History   Tobacco Use  . Smoking status: Former Research scientist (life sciences)  . Smokeless tobacco: Never Used  Substance Use Topics  . Alcohol use: Not Currently  . Drug use: Never     Allergies   Penicillins   Review of Systems Review of Systems  All other systems reviewed and are negative.    Physical Exam Updated Vital Signs BP (!) 166/75   Pulse 69   Temp 97.8 F (36.6 C) (Oral)   Resp (!) 21   SpO2 98%   Physical Exam  Constitutional: She appears well-developed. No distress.  Frail, elderly  HENT:  Head: Normocephalic and atraumatic.  No contusions, abrasions or lacerations of the face or scalp.  Eyes: Pupils are equal, round, and reactive to light. Conjunctivae and EOM are normal.  Neck: Normal range of motion and phonation normal. Neck supple.  Cardiovascular: Normal rate and regular rhythm.  Pulmonary/Chest: Effort normal and breath sounds normal. She exhibits no tenderness.  Abdominal: Soft. She exhibits no distension. There is no tenderness. There is no guarding.  Musculoskeletal: Normal range of motion. She exhibits no edema.  Neurological: She is alert. No  cranial nerve deficit. She exhibits normal muscle tone. Coordination normal.  No dysarthria or aphasia.  Skin: Skin is warm and dry.  Psychiatric: She has a normal mood and affect. Her behavior is normal.  Nursing note and vitals reviewed.    ED Treatments / Results  Labs (all labs ordered are listed, but only abnormal results are displayed) Labs Reviewed  COMPREHENSIVE METABOLIC PANEL - Abnormal; Notable for the following components:      Result Value   Creatinine, Ser 1.07 (*)    GFR calc non Af Amer 50 (*)    GFR calc Af Amer 58 (*)    All other components within normal limits   URINALYSIS, ROUTINE W REFLEX MICROSCOPIC - Abnormal; Notable for the following components:   APPearance CLOUDY (*)    Hgb urine dipstick MODERATE (*)    Ketones, ur 20 (*)    Nitrite POSITIVE (*)    Leukocytes, UA MODERATE (*)    Bacteria, UA MANY (*)    All other components within normal limits  URINE CULTURE  CBC  DIFFERENTIAL  PROTIME-INR  APTT  I-STAT TROPONIN, ED    EKG EKG Interpretation  Date/Time:  Monday November 05 2018 15:15:13 EST Ventricular Rate:  68 PR Interval:  138 QRS Duration: 72 QT Interval:  408 QTC Calculation: 433 R Axis:   80 Text Interpretation:  Normal sinus rhythm Nonspecific ST abnormality Abnormal ECG since last tracing no significant change Confirmed by Daleen Bo [phone]) on 11/05/2018 4:17:25 PM   Radiology Ct Head Wo Contrast  Result Date: 11/05/2018 CLINICAL DATA:  Dizziness and unsteady gait. EXAM: CT HEAD WITHOUT CONTRAST TECHNIQUE: Contiguous axial images were obtained from the base of the skull through the vertex without intravenous contrast. COMPARISON:  05/10/2018 FINDINGS: Brain: No evidence of acute infarction, hemorrhage, hydrocephalus, extra-axial collection or mass lesion/mass effect. Prominence of the sulci and ventricles compatible with brain atrophy. Vascular: No hyperdense vessel or unexpected calcification. Skull: Normal. Negative for fracture or focal lesion. Sinuses/Orbits: No acute finding. Other: None IMPRESSION: 1. No acute findings. 2. Similar appearance of brain atrophy compared with 05/10/2018. Electronically Signed   By: Kerby Moors M.D.   On: 11/05/2018 18:23    Procedures Procedures (including critical care time)  Medications Ordered in ED Medications  cephALEXin (KEFLEX) capsule 500 mg (has no administration in time range)     Initial Impression / Assessment and Plan / ED Course  I have reviewed the triage vital signs and the nursing notes.  Pertinent labs & imaging results that were available during  my care of the patient were reviewed by me and considered in my medical decision making (see chart for details).  Clinical Course as of Nov 05 2020  Mon Nov 05, 2018  1619 Evaluation begun, patient not yet in room.   [EW]  1726 Normal  Comprehensive metabolic panel(!) [EW]  3244 Normal  Differential [EW]  1726 Normal  I-stat troponin, ED [EW]  1727 Normal  CBC [EW]  2014 Normal except the urine with blood and ketones present.  Also nitrite positive.  On microscopic elevated WBCs and bacteria.  Urine culture ordered.  Urinalysis, Routine w reflex microscopic(!) [EW]    Clinical Course User Index [EW] Daleen Bo, MD     Patient Vitals for the past 24 hrs:  BP Temp Temp src Pulse Resp SpO2  11/05/18 2000 (!) 166/75 - - 69 (!) 21 98 %  11/05/18 1945 (!) 171/71 - - 71 18 97 %  11/05/18 1930 (!)  164/75 - - 64 19 98 %  11/05/18 1915 (!) 161/79 - - 71 17 98 %  11/05/18 1844 (!) 166/70 - - 65 18 98 %  11/05/18 1800 (!) 173/72 - - 66 16 98 %  11/05/18 1715 (!) 198/89 - - 71 15 100 %  11/05/18 1645 (!) 196/72 - - (!) 57 15 98 %  11/05/18 1507 (!) 160/71 97.8 F (36.6 C) Oral 70 18 100 %    8:15 PM Reevaluation with update and discussion. After initial assessment and treatment, an updated evaluation reveals patient has requested food, and it was offered.  He tolerated her food.  She has no further complaints.  Findings discussed and questions answered. Daleen Bo   Medical Decision Making: Fall without serious injury.  Patient with urinary tract infection likely contributing to the fall.  Doubt serious bacterial infection, metabolic instability, severe sepsis or impending vascular collapse.  CRITICAL CARE-no Performed by: Daleen Bo   Nursing Notes Reviewed/ Care Coordinated Applicable Imaging Reviewed Interpretation of Laboratory Data incorporated into ED treatment  The patient appears reasonably screened and/or stabilized for discharge and I doubt any other medical  condition or other [facility] requiring further screening, evaluation, or treatment in the ED at this time prior to discharge.  Plan: Home Medications-routine medications; Home Treatments-rest, fluids; return here if the recommended treatment, does not improve the symptoms; Recommended follow up-to be checkup 1 week and as needed.    Final Clinical Impressions(s) / ED Diagnoses   Final diagnoses:  Lower urinary tract infectious disease  Fall in home, initial encounter  Injury of head, initial encounter    ED Discharge Orders    None       Daleen Bo, MD 11/05/18 2024

## 2018-11-05 NOTE — ED Triage Notes (Addendum)
Pt reports having dizziness and unsteady gait today, is unsure of exact time of onset. Reports falling today around 2:00, hit her head and right knee. No loc, no thinners. Grips are equal, no arm drift, no facial droop noted. Family reports pt being altered, has hx of UTI, pt reports lower back pain.

## 2018-11-08 LAB — URINE CULTURE

## 2018-11-09 ENCOUNTER — Telehealth: Payer: Self-pay | Admitting: Emergency Medicine

## 2018-11-09 NOTE — Telephone Encounter (Signed)
Post ED Visit - Positive Culture Follow-up  Culture report reviewed by antimicrobial stewardship pharmacist:  []  Elenor Quinones, Pharm.D. []  Heide Guile, Pharm.D., BCPS AQ-ID []  Parks Neptune, Pharm.D., BCPS []  Alycia Rossetti, Pharm.D., BCPS []  [place], Pharm.D., BCPS, AAHIVP []  Legrand Como, Pharm.D., BCPS, AAHIVP [x]  Salome Arnt, PharmD, BCPS []  Johnnette Gourd, PharmD, BCPS []  Hughes Better, PharmD, BCPS []  Leeroy Cha, PharmD  Positive urine culture Treated with cephalexin, organism sensitive to the same and no further patient follow-up is required at this time.  Hazle Nordmann 11/09/2018, 11:48 AM

## 2018-11-20 ENCOUNTER — Other Ambulatory Visit: Payer: Self-pay | Admitting: Internal Medicine

## 2018-11-20 DIAGNOSIS — Z1231 Encounter for screening mammogram for malignant neoplasm of breast: Secondary | ICD-10-CM

## 2018-12-31 ENCOUNTER — Ambulatory Visit
Admission: RE | Admit: 2018-12-31 | Discharge: 2018-12-31 | Disposition: A | Discharge status: Home / Self Care | Payer: Medicare Other | Source: Ambulatory Visit | Attending: Internal Medicine | Admitting: Internal Medicine

## 2018-12-31 DIAGNOSIS — Z1231 Encounter for screening mammogram for malignant neoplasm of breast: Secondary | ICD-10-CM

## 2019-03-22 ENCOUNTER — Ambulatory Visit: Payer: Medicare HMO | Admitting: Neurology

## 2019-08-23 ENCOUNTER — Other Ambulatory Visit: Payer: Self-pay | Admitting: Neurology

## 2019-09-26 ENCOUNTER — Other Ambulatory Visit: Payer: Self-pay | Admitting: Neurology

## 2019-10-22 ENCOUNTER — Ambulatory Visit: Payer: Medicare Other | Admitting: Neurology

## 2019-12-09 ENCOUNTER — Other Ambulatory Visit: Payer: Self-pay | Admitting: Neurology

## 2019-12-17 ENCOUNTER — Other Ambulatory Visit: Payer: Self-pay | Admitting: Neurology

## 2020-02-25 ENCOUNTER — Inpatient Hospital Stay (HOSPITAL_COMMUNITY)
Admission: EM | Admit: 2020-02-25 | Discharge: 2020-03-03 | DRG: 682 | Disposition: A | Discharge status: Home Health | Payer: Medicare Other | Attending: Internal Medicine | Admitting: Internal Medicine

## 2020-02-25 ENCOUNTER — Emergency Department (HOSPITAL_COMMUNITY): Payer: Medicare Other

## 2020-02-25 ENCOUNTER — Other Ambulatory Visit: Payer: Self-pay

## 2020-02-25 ENCOUNTER — Encounter (HOSPITAL_COMMUNITY): Payer: Self-pay

## 2020-02-25 DIAGNOSIS — Z20822 Contact with and (suspected) exposure to covid-19: Secondary | ICD-10-CM | POA: Diagnosis present

## 2020-02-25 DIAGNOSIS — R64 Cachexia: Secondary | ICD-10-CM | POA: Diagnosis present

## 2020-02-25 DIAGNOSIS — N182 Chronic kidney disease, stage 2 (mild): Secondary | ICD-10-CM | POA: Diagnosis present

## 2020-02-25 DIAGNOSIS — G9341 Metabolic encephalopathy: Secondary | ICD-10-CM | POA: Diagnosis not present

## 2020-02-25 DIAGNOSIS — F0391 Unspecified dementia with behavioral disturbance: Secondary | ICD-10-CM | POA: Diagnosis present

## 2020-02-25 DIAGNOSIS — E860 Dehydration: Secondary | ICD-10-CM | POA: Diagnosis present

## 2020-02-25 DIAGNOSIS — G4710 Hypersomnia, unspecified: Secondary | ICD-10-CM | POA: Diagnosis not present

## 2020-02-25 DIAGNOSIS — E43 Unspecified severe protein-calorie malnutrition: Secondary | ICD-10-CM | POA: Diagnosis present

## 2020-02-25 DIAGNOSIS — R319 Hematuria, unspecified: Secondary | ICD-10-CM | POA: Diagnosis present

## 2020-02-25 DIAGNOSIS — F05 Delirium due to known physiological condition: Secondary | ICD-10-CM | POA: Diagnosis present

## 2020-02-25 DIAGNOSIS — E870 Hyperosmolality and hypernatremia: Secondary | ICD-10-CM

## 2020-02-25 DIAGNOSIS — Z87891 Personal history of nicotine dependence: Secondary | ICD-10-CM | POA: Diagnosis not present

## 2020-02-25 DIAGNOSIS — N179 Acute kidney failure, unspecified: Secondary | ICD-10-CM | POA: Diagnosis present

## 2020-02-25 DIAGNOSIS — F0151 Vascular dementia with behavioral disturbance: Secondary | ICD-10-CM | POA: Diagnosis not present

## 2020-02-25 DIAGNOSIS — R1310 Dysphagia, unspecified: Secondary | ICD-10-CM | POA: Diagnosis present

## 2020-02-25 DIAGNOSIS — L8990 Pressure ulcer of unspecified site, unspecified stage: Secondary | ICD-10-CM | POA: Insufficient documentation

## 2020-02-25 DIAGNOSIS — I129 Hypertensive chronic kidney disease with stage 1 through stage 4 chronic kidney disease, or unspecified chronic kidney disease: Secondary | ICD-10-CM | POA: Diagnosis present

## 2020-02-25 DIAGNOSIS — L89151 Pressure ulcer of sacral region, stage 1: Secondary | ICD-10-CM | POA: Diagnosis present

## 2020-02-25 DIAGNOSIS — Z880 Allergy status to penicillin: Secondary | ICD-10-CM | POA: Diagnosis not present

## 2020-02-25 DIAGNOSIS — F03918 Unspecified dementia, unspecified severity, with other behavioral disturbance: Secondary | ICD-10-CM

## 2020-02-25 DIAGNOSIS — G92 Toxic encephalopathy: Secondary | ICD-10-CM | POA: Diagnosis present

## 2020-02-25 DIAGNOSIS — E876 Hypokalemia: Secondary | ICD-10-CM | POA: Diagnosis present

## 2020-02-25 DIAGNOSIS — N390 Urinary tract infection, site not specified: Secondary | ICD-10-CM | POA: Diagnosis present

## 2020-02-25 DIAGNOSIS — R627 Adult failure to thrive: Secondary | ICD-10-CM | POA: Diagnosis present

## 2020-02-25 DIAGNOSIS — Z515 Encounter for palliative care: Secondary | ICD-10-CM | POA: Diagnosis not present

## 2020-02-25 DIAGNOSIS — R451 Restlessness and agitation: Secondary | ICD-10-CM | POA: Diagnosis not present

## 2020-02-25 DIAGNOSIS — E7800 Pure hypercholesterolemia, unspecified: Secondary | ICD-10-CM | POA: Diagnosis present

## 2020-02-25 DIAGNOSIS — Z79899 Other long term (current) drug therapy: Secondary | ICD-10-CM | POA: Diagnosis not present

## 2020-02-25 DIAGNOSIS — Z681 Body mass index (BMI) 19 or less, adult: Secondary | ICD-10-CM | POA: Diagnosis not present

## 2020-02-25 DIAGNOSIS — Z87440 Personal history of urinary (tract) infections: Secondary | ICD-10-CM

## 2020-02-25 DIAGNOSIS — T43595A Adverse effect of other antipsychotics and neuroleptics, initial encounter: Secondary | ICD-10-CM | POA: Diagnosis not present

## 2020-02-25 DIAGNOSIS — Z7189 Other specified counseling: Secondary | ICD-10-CM | POA: Diagnosis not present

## 2020-02-25 DIAGNOSIS — L89000 Pressure ulcer of unspecified elbow, unstageable: Secondary | ICD-10-CM | POA: Diagnosis not present

## 2020-02-25 LAB — CBC WITH DIFFERENTIAL/PLATELET
Abs Immature Granulocytes: 0.04 10*3/uL (ref 0.00–0.07)
Basophils Absolute: 0.1 10*3/uL (ref 0.0–0.1)
Basophils Relative: 1 %
Eosinophils Absolute: 0.1 10*3/uL (ref 0.0–0.5)
Eosinophils Relative: 1 %
HCT: 46.2 % — ABNORMAL HIGH (ref 36.0–46.0)
Hemoglobin: 13.9 g/dL (ref 12.0–15.0)
Immature Granulocytes: 0 %
Lymphocytes Relative: 25 %
Lymphs Abs: 2.3 10*3/uL (ref 0.7–4.0)
MCH: 28.5 pg (ref 26.0–34.0)
MCHC: 30.1 g/dL (ref 30.0–36.0)
MCV: 94.7 fL (ref 80.0–100.0)
Monocytes Absolute: 0.5 10*3/uL (ref 0.1–1.0)
Monocytes Relative: 6 %
Neutro Abs: 6.3 10*3/uL (ref 1.7–7.7)
Neutrophils Relative %: 67 %
Platelets: 156 10*3/uL (ref 150–400)
RBC: 4.88 MIL/uL (ref 3.87–5.11)
RDW: 14.8 % (ref 11.5–15.5)
WBC: 9.3 10*3/uL (ref 4.0–10.5)
nRBC: 0 % (ref 0.0–0.2)

## 2020-02-25 LAB — BASIC METABOLIC PANEL
Anion gap: 14 (ref 5–15)
BUN: 71 mg/dL — ABNORMAL HIGH (ref 8–23)
CO2: 26 mmol/L (ref 22–32)
Calcium: 9.5 mg/dL (ref 8.9–10.3)
Chloride: 121 mmol/L — ABNORMAL HIGH (ref 98–111)
Creatinine, Ser: 5.25 mg/dL — ABNORMAL HIGH (ref 0.44–1.00)
GFR calc Af Amer: 9 mL/min — ABNORMAL LOW (ref >60)
GFR calc non Af Amer: 7 mL/min — ABNORMAL LOW (ref >60)
Glucose, Bld: 95 mg/dL (ref 70–99)
Potassium: 3.2 mmol/L — ABNORMAL LOW (ref 3.5–5.1)
Sodium: 161 mmol/L (ref 135–145)

## 2020-02-25 LAB — URINALYSIS, ROUTINE W REFLEX MICROSCOPIC
Bilirubin Urine: NEGATIVE
Glucose, UA: NEGATIVE mg/dL
Hgb urine dipstick: NEGATIVE
Ketones, ur: 5 mg/dL — AB
Nitrite: NEGATIVE
Protein, ur: >=300 mg/dL — AB
Specific Gravity, Urine: 1.016 (ref 1.005–1.030)
WBC, UA: >50 WBC/hpf — ABNORMAL HIGH (ref 0–5)
pH: 8 (ref 5.0–8.0)

## 2020-02-25 LAB — COMPREHENSIVE METABOLIC PANEL
ALT: 13 U/L (ref 0–44)
AST: 22 U/L (ref 15–41)
Albumin: 3.5 g/dL (ref 3.5–5.0)
Alkaline Phosphatase: 43 U/L (ref 38–126)
Anion gap: 13 (ref 5–15)
BUN: 69 mg/dL — ABNORMAL HIGH (ref 8–23)
CO2: 23 mmol/L (ref 22–32)
Calcium: 9.2 mg/dL (ref 8.9–10.3)
Chloride: 122 mmol/L — ABNORMAL HIGH (ref 98–111)
Creatinine, Ser: 5.29 mg/dL — ABNORMAL HIGH (ref 0.44–1.00)
GFR calc Af Amer: 9 mL/min — ABNORMAL LOW (ref >60)
GFR calc non Af Amer: 7 mL/min — ABNORMAL LOW (ref >60)
Glucose, Bld: 86 mg/dL (ref 70–99)
Potassium: 4.5 mmol/L (ref 3.5–5.1)
Sodium: 158 mmol/L — ABNORMAL HIGH (ref 135–145)
Total Bilirubin: 1.5 mg/dL — ABNORMAL HIGH (ref 0.3–1.2)
Total Protein: 6.4 g/dL — ABNORMAL LOW (ref 6.5–8.1)

## 2020-02-25 LAB — CREATININE, URINE, RANDOM: Creatinine, Urine: 289.25 mg/dL

## 2020-02-25 LAB — SODIUM, URINE, RANDOM: Sodium, Ur: 90 mmol/L

## 2020-02-25 MED ORDER — LACTATED RINGERS IV SOLN: INTRAVENOUS | Status: DC

## 2020-02-25 MED ORDER — SODIUM CHLORIDE 0.9 % IV SOLN
1.0000 g | INTRAVENOUS | Status: DC
  Administered 2020-02-25 – 2020-02-27 (×3): 1 g via INTRAVENOUS
  Filled 2020-02-25: qty 10
  Filled 2020-02-25: qty 1
  Filled 2020-02-25: qty 10
  Filled 2020-02-25: qty 1

## 2020-02-25 NOTE — ED Notes (Signed)
Bladder scan reveals 24ml. Pt in and out cath'ed 30-50cc of urine out. Cath became clogged with sediment.

## 2020-02-25 NOTE — H&P (Signed)
History and Physical  Katie Jackson [id number] DOB: 29-Aug-1946 DOA: 02/25/2020   Referring physician: ER provider PCP: Wenda Low, MD  Outpatient Specialists:    Patient coming from: Home  Chief Complaint: Poor p.o. intake  HPI:  Patient is a 74 year old Caucasian female with past medical history significant for dementia, hypertension and hyperlipidemia.  Due to the dementing illness, patient is unable to give any significant history.  As per collateral information, patient has had poor p.o. intake in the last 2 weeks, more confused, feeling weak with reported difficulty swallowing.  On presentation to the hospital, patient was found to have a sodium of 161, potassium of 3.2, BUN of 71, serum creatinine of 5.25 (up from 1.07 on 11/05/2017).  UA revealed 5 ketone, moderate leukocyte esterase, greater than 300 protein, specific gravity of 1.016, few bacteria and greater than 50 WBC in the urine.  As mentioned above, patient is unable to give any history.  Hospitalist team has been asked to admit patient.   ED Course: On presentation to the hospital, vitals revealed temperature of 98, blood pressure of 94/49, heart rate of 87 bpm, respiratory rate of 15 and O2 sat of 98%.  ER provider has infused IV fluids, started initial work-up as well as IV antibiotics (Rocephin).  Hospitalist team has been asked to admit patient.  Pertinent labs:   EKG: Independently reviewed.   Imaging: independently reviewed.   Review of Systems:  Unobtainable.  Past Medical History:  Diagnosis Date  . High cholesterol   . Hypertension     Past Surgical History:  Procedure Laterality Date  . BREAST BIOPSY    . cataract Bilateral      reports that she has quit smoking. She has never used smokeless tobacco. She reports previous alcohol use. She reports that she does not use drugs.  Allergies  Allergen Reactions  . Penicillins Other (See Comments)    From childhood; reaction not recalled- Has  patient had a PCN reaction causing immediate rash, facial/tongue/throat swelling, SOB or lightheadedness with hypotension: Unk Has patient had a PCN reaction causing severe rash involving mucus membranes or skin necrosis: Unk Has patient had a PCN reaction that required hospitalization: Unk Has patient had a PCN reaction occurring within the last 10 years: No If all of the above answers are "NO", then may proceed with Cephalosporin use.     History reviewed. No pertinent family history.   Prior to Admission medications   Medication Sig Start Date End Date Taking? Authorizing Provider  Calcium Carb-Cholecalciferol (CALCIUM 600/VITAMIN D3 PO) Take 1 tablet by mouth 2 (two) times daily with a meal.     [provider]  cephALEXin (KEFLEX) 250 MG capsule Take 1 capsule (250 mg total) by mouth 4 (four) times daily. 11/05/18   Daleen Bo, MD  donepezil (ARICEPT) 10 MG tablet TAKE 1 TABLET BY MOUTH AT BEDTIME 09/27/19   Cameron Sprang, MD  escitalopram (LEXAPRO) 10 MG tablet TAKE 1 TABLET BY MOUTH AT BEDTIME 09/27/19   Cameron Sprang, MD  ibuprofen (ADVIL,MOTRIN) 200 MG tablet Take 200 mg by mouth every 6 (six) hours as needed (for pain or headaches).    [provider]  lisinopril (PRINIVIL,ZESTRIL) 20 MG tablet Take 20 mg by mouth every morning.    [provider]  lovastatin (MEVACOR) 20 MG tablet Take 20 mg by mouth at bedtime.    [provider]  valACYclovir (VALTREX) 1000 MG tablet Take 1,000 mg by mouth as needed (as  needed for facial breakouts).     [provider]    Physical Exam: Vitals:   02/25/20 1745 02/25/20 1830 02/25/20 1859 02/25/20 2000  BP: 115/61  (!) 104/46 (!) 94/49  Pulse:  94 84 87  Resp: 15 16 18 15   Temp:      TempSrc:      SpO2:  97% 98% 98%    Constitutional:  . Appears calm and comfortable.  Patient is cachectic.  Dry buccal mucosa. Eyes:  . No pallor. No jaundice.  ENMT:  . external ears, nose appear  normal Neck:  . Neck is supple. No JVD Respiratory:  . CTA bilaterally, no w/r/r.  . Respiratory effort normal. No retractions or accessory muscle use Cardiovascular:  . S1S2 . No LE extremity edema   Abdomen:  . Abdomen is soft and non tender. Organs are difficult to assess. Neurologic:  . Awake and alert. . Moves all limbs.  Wt Readings from Last 3 Encounters:  08/02/18 51.7 kg  06/10/18 54.9 kg    I have personally reviewed following labs and imaging studies  Labs on Admission:  CBC: Recent Labs  Lab 02/25/20 1558  WBC 9.3  NEUTROABS 6.3  HGB 13.9  HCT 46.2*  MCV 94.7  PLT [id number]   Basic Metabolic Panel: Recent Labs  Lab 02/25/20 1558 02/25/20 1714  NA 158* 161*  K 4.5 3.2*  CL 122* 121*  CO2 23 26  GLUCOSE 86 95  BUN 69* 71*  CREATININE 5.29* 5.25*  CALCIUM 9.2 9.5   Liver Function Tests: Recent Labs  Lab 02/25/20 1558  AST 22  ALT 13  ALKPHOS 43  BILITOT 1.5*  PROT 6.4*  ALBUMIN 3.5   No results for input(s): LIPASE, AMYLASE in the last 168 hours. No results for input(s): AMMONIA in the last 168 hours. Coagulation Profile: No results for input(s): INR, PROTIME in the last 168 hours. Cardiac Enzymes: No results for input(s): CKTOTAL, CKMB, CKMBINDEX, TROPONINI in the last 168 hours. BNP (last 3 results) No results for input(s): PROBNP in the last 8760 hours. HbA1C: No results for input(s): HGBA1C in the last 72 hours. CBG: No results for input(s): GLUCAP in the last 168 hours. Lipid Profile: No results for input(s): CHOL, HDL, LDLCALC, TRIG, CHOLHDL, LDLDIRECT in the last 72 hours. Thyroid Function Tests: No results for input(s): TSH, T4TOTAL, FREET4, T3FREE, THYROIDAB in the last 72 hours. Anemia Panel: No results for input(s): VITAMINB12, FOLATE, FERRITIN, TIBC, IRON, RETICCTPCT in the last 72 hours. Urine analysis:    Component Value Date/Time   COLORURINE YELLOW 02/25/2020 1714   APPEARANCEUR TURBID (A) 02/25/2020 1714   LABSPEC  1.016 02/25/2020 1714   PHURINE 8.0 02/25/2020 1714   GLUCOSEU NEGATIVE 02/25/2020 1714   HGBUR NEGATIVE 02/25/2020 1714   BILIRUBINUR NEGATIVE 02/25/2020 1714   KETONESUR 5 (A) 02/25/2020 1714   PROTEINUR >=300 (A) 02/25/2020 1714   NITRITE NEGATIVE 02/25/2020 1714   LEUKOCYTESUR MODERATE (A) 02/25/2020 1714   Sepsis Labs: @LABRCNTIP (procalcitonin:4,lacticidven:4) )No results found for this or any previous visit (from the past 240 hour(s)).    Radiological Exams on Admission: DG Chest 2 View  Result Date: 02/25/2020 CLINICAL DATA:  Weakness, dementia EXAM: CHEST - 2 VIEW COMPARISON:  09/18/2008 FINDINGS: Frontal and lateral views of the chest demonstrate an unremarkable cardiac silhouette. Lungs are hyperinflated without airspace disease, effusion, or pneumothorax. No acute bony abnormalities. IMPRESSION: 1. No acute intrathoracic process. 2. Emphysema. Electronically Signed   By: Diana Eves.D.  On: 02/25/2020 16:11    EKG: Independently reviewed.   Active Problems:   AKI (acute kidney injury) ([place])   Assessment/Plan Acute kidney injury:  -Suspect this is all prerenal. -Cannot rule out ATN. -Cautious hydration, considering hypernatremia (goal is sodium level drop of 0.5 mmol/L)  -Renal ultrasound -Avoid nephrotoxins -Dose all medications assuming GFR of less than 5 mils per minute -Low threshold to consult the nephrology team.  Encephalopathy:  -Likely combined toxic and metabolic. -Patient is dehydrated, with sodium of 161 mmol/L. -Patient may also have possible UTI. -Manage above conditions. -Continue to monitor patient's mental status closely.  Patient also has baseline dementia.  Dehydration: -Cautious volume repletion. -Monitor sodium level closely.  Hypernatremia: -Likely related to poor p.o. intake. -Manage as documented above.  Possible UTI: -Urine culture -Continue IV Rocephin already prescribed by the ER team. -Follow culture  results.  Dementia: -No reported behavioral problems.  To now -Manage expectantly.  Reported dysphagia: -Consult speech therapy. -Patient may need barium studies -Further management depend on hospital course  Failure to thrive: -Continue management as above. -Guarded prognosis -Low threshold to consult palliative care team.  DVT prophylaxis: Subcutaneous heparin Code Status: Full code Family Communication:  Disposition Plan: This will depend on hospital course Consults called: None Admission status: Inpatient  Time spent: 65 minutes  Dana Allan, MD  [facility] Pager #: [phone] 7PM-7AM contact night coverage as above  02/25/2020, 9:52 PM

## 2020-02-25 NOTE — ED Notes (Signed)
Upon going into pts room, Pt had removed all monitoring equipment, removed IV, removed gown and put clothing back on. Pt sitting on the end of the bed stating "Im going to [employer]" Pt assisted back in bed. Pts monitoring equipment placed back on. Pt cleaned. Pts daughter to pt bedside. Posey alarm placed on pt at this time. In and out cath performed after bladder scan.

## 2020-02-25 NOTE — ED Notes (Signed)
LR paused at this time in order To infuse abx.

## 2020-02-25 NOTE — ED Notes (Signed)
Purewick placed on pt. 

## 2020-02-25 NOTE — ED Provider Notes (Addendum)
[facility] DEPT Provider Note   CSN: [id number] Arrival date & time: 02/25/20  1514     History Chief Complaint  Patient presents with  . Weakness  . Failure To Thrive    Katie Jackson is a 74 y.o. female.  Presents to ER with chief complaint generalized weakness, decreased appetite.  Patient lives alone, family frequently checks on her.  She says that she has no new complaints today but says her daughter is concerned about her decreased appetite.  Additional history obtained from daughter, patient has dementia, lives alone, has not been eating or drinking regularly over the past couple weeks and today has not eaten or drink anything.  Concerned about possible UTI.  Patient denies any chest pain, difficulty breathing, abdominal pain, vomiting, dysuria, hematuria, fever.  HPI     Past Medical History:  Diagnosis Date  . High cholesterol   . Hypertension     Patient Active Problem List   Diagnosis Date Noted  . AKI (acute kidney injury) ([facility]) 02/25/2020    Past Surgical History:  Procedure Laterality Date  . BREAST BIOPSY    . cataract Bilateral      OB History   No obstetric history on file.     History reviewed. No pertinent family history.  Social History   Tobacco Use  . Smoking status: Former Research scientist (life sciences)  . Smokeless tobacco: Never Used  Substance Use Topics  . Alcohol use: Not Currently  . Drug use: Never    Home Medications Prior to Admission medications   Medication Sig Start Date End Date Taking? Authorizing Provider  Calcium Carb-Cholecalciferol (CALCIUM 600/VITAMIN D3 PO) Take 1 tablet by mouth 2 (two) times daily with a meal.    Yes [provider]  donepezil (ARICEPT) 10 MG tablet TAKE 1 TABLET BY MOUTH AT BEDTIME 09/27/19  Yes Cameron Sprang, MD  ibuprofen (ADVIL,MOTRIN) 200 MG tablet Take 200 mg by mouth every 6 (six) hours as needed (for pain or headaches).   Yes [provider]  lisinopril  (PRINIVIL,ZESTRIL) 20 MG tablet Take 20 mg by mouth every morning.   Yes [provider]  lovastatin (MEVACOR) 20 MG tablet Take 20 mg by mouth at bedtime.   Yes [provider]  cephALEXin (KEFLEX) 250 MG capsule Take 1 capsule (250 mg total) by mouth 4 (four) times daily. Patient not taking: Reported on 02/25/2020 11/05/18   Daleen Bo, MD  escitalopram (LEXAPRO) 10 MG tablet TAKE 1 TABLET BY MOUTH AT BEDTIME Patient not taking: Reported on 02/25/2020 09/27/19   Cameron Sprang, MD  valACYclovir (VALTREX) 1000 MG tablet Take 1,000 mg by mouth as needed (as needed for facial breakouts).     [provider]    Allergies    Penicillins  Review of Systems   Review of Systems  Constitutional: Negative for chills and fever.  HENT: Negative for ear pain and sore throat.   Eyes: Negative for pain and visual disturbance.  Respiratory: Negative for cough and shortness of breath.   Cardiovascular: Negative for chest pain and palpitations.  Gastrointestinal: Negative for abdominal pain and vomiting.  Genitourinary: Negative for dysuria and hematuria.  Musculoskeletal: Negative for arthralgias and back pain.  Skin: Negative for color change and rash.  Neurological: Negative for seizures and syncope.  All other systems reviewed and are negative.   Physical Exam Updated Vital Signs BP 92/75   Pulse 80   Temp 98 F (36.7 C) (Oral)   Resp  15   Wt 45.8 kg   SpO2 98%   BMI 18.47 kg/m   Physical Exam Vitals and nursing note reviewed.  Constitutional:      General: She is not in acute distress.    Appearance: She is well-developed.  HENT:     Head: Normocephalic and atraumatic.  Eyes:     Conjunctiva/sclera: Conjunctivae normal.  Cardiovascular:     Rate and Rhythm: Normal rate and regular rhythm.     Heart sounds: No murmur.  Pulmonary:     Effort: Pulmonary effort is normal. No respiratory distress.     Breath sounds: Normal breath sounds.  Abdominal:      Palpations: Abdomen is soft.     Tenderness: There is no abdominal tenderness.  Musculoskeletal:     Cervical back: Neck supple.  Skin:    General: Skin is warm and dry.  Neurological:     General: No focal deficit present.     Mental Status: She is alert and oriented to person, place, and time.     Comments: Pleasantly demented, answers most questions appropriately, oriented to person, place but not time  Psychiatric:        Mood and Affect: Mood normal.        Behavior: Behavior normal.     ED Results / Procedures / Treatments   Labs (all labs ordered are listed, but only abnormal results are displayed) Labs Reviewed  CBC WITH DIFFERENTIAL/PLATELET - Abnormal; Notable for the following components:      Result Value   HCT 46.2 (*)    All other components within normal limits  COMPREHENSIVE METABOLIC PANEL - Abnormal; Notable for the following components:   Sodium 158 (*)    Chloride 122 (*)    BUN 69 (*)    Creatinine, Ser 5.29 (*)    Total Protein 6.4 (*)    Total Bilirubin 1.5 (*)    GFR calc non Af Amer 7 (*)    GFR calc Af Amer 9 (*)    All other components within normal limits  URINALYSIS, ROUTINE W REFLEX MICROSCOPIC - Abnormal; Notable for the following components:   APPearance TURBID (*)    Ketones, ur 5 (*)    Protein, ur >=300 (*)    Leukocytes,Ua MODERATE (*)    WBC, UA >50 (*)    Bacteria, UA FEW (*)    All other components within normal limits  BASIC METABOLIC PANEL - Abnormal; Notable for the following components:   Sodium 161 (*)    Potassium 3.2 (*)    Chloride 121 (*)    BUN 71 (*)    Creatinine, Ser 5.25 (*)    GFR calc non Af Amer 7 (*)    GFR calc Af Amer 9 (*)    All other components within normal limits  SARS CORONAVIRUS 2 (TAT 6-24 HRS)  SODIUM, URINE, RANDOM  CREATININE, URINE, RANDOM  OSMOLALITY, URINE  OSMOLALITY  PROTEIN / CREATININE RATIO, URINE    EKG EKG Interpretation  Date/Time:  Tuesday February 25 2020 15:38:32  EST Ventricular Rate:  78 PR Interval:    QRS Duration: 108 QT Interval:  414 QTC Calculation: 472 R Axis:   71 Text Interpretation: Normal sinus rhythm Poor data quality No acute changes Confirmed by Madalyn Rob [phone]) on 02/25/2020 4:04:34 PM   Radiology DG Chest 2 View  Result Date: 02/25/2020 CLINICAL DATA:  Weakness, dementia EXAM: CHEST - 2 VIEW COMPARISON:  09/18/2008 FINDINGS: Frontal and lateral views  of the chest demonstrate an unremarkable cardiac silhouette. Lungs are hyperinflated without airspace disease, effusion, or pneumothorax. No acute bony abnormalities. IMPRESSION: 1. No acute intrathoracic process. 2. Emphysema. Electronically Signed   By: Randa Ngo M.D.   On: 02/25/2020 16:11    Procedures .Critical Care Performed by: Lucrezia Starch, MD Authorized by: Lucrezia Starch, MD   Critical care provider statement:    Critical care time (minutes):  45   Critical care was necessary to treat or prevent imminent or life-threatening deterioration of the following conditions:  Metabolic crisis   Critical care was time spent personally by me on the following activities:  Discussions with consultants, evaluation of patient's response to treatment, examination of patient, ordering and performing treatments and interventions, ordering and review of laboratory studies, ordering and review of radiographic studies, pulse oximetry, re-evaluation of patient's condition, obtaining history from patient or surrogate and review of old charts   (including critical care time)  Medications Ordered in ED Medications  lactated ringers infusion ( Intravenous Paused 02/25/20 1852)  cefTRIAXone (ROCEPHIN) 1 g in sodium chloride 0.9 % 100 mL IVPB (1 g Intravenous New Bag/Given (Non-Interop) 02/25/20 1850)    ED Course  I have reviewed the triage vital signs and the nursing notes.  Pertinent labs & imaging results that were available during my care of the patient were reviewed by me  and considered in my medical decision making (see chart for details).  Clinical Course as of Feb 25 25  Tue Feb 25, 2020  1540 Completed initial assessment, well appearing, no distress, no acute complaints   [RD]  1542 Attempted to call daughter and cell phone, no answer, left VM   [RD]  1545 D/w [name] - decreased PO intake, genearlized weakness, concern for possible uti, no other concerns   [RD]  1834 Rechecked - updated on results - will admit hosp   [RD]    Clinical Course User Index [RD] Lucrezia Starch, MD   MDM Rules/Calculators/A&P                      74 year old lady notable history for dementia presented to ER with decreased p.o. intake, generalized weakness.  On exam patient is well-appearing with normal vital signs.  Labs concerning for renal failure, hypernatremia.  Started gentle rehydration with lactated Ringer's, suspect dehydration likely etiology.  Consulted hospitalist for admission. Dr. Marthenia Rolling accepting.  Final Clinical Impression(s) / ED Diagnoses Final diagnoses:  AKI (acute kidney injury) ([place])  Hypernatremia  Urinary tract infection with hematuria, site unspecified    Rx / DC Orders ED Discharge Orders    None       Lucrezia Starch, MD 02/26/20 [id number]    Lucrezia Starch, MD 02/26/20 [phone]

## 2020-02-25 NOTE — ED Notes (Signed)
The patients daughter reports the paitent has been having difficulty swallowing recently which may contribute to the paitent not wanting to eat or drink. MD aware

## 2020-02-25 NOTE — ED Notes (Signed)
Date and time results received: 02/25/20 1823 (use smartphrase ".now" to insert current time)  Test: Sodium Critical Value: 161  Name of Provider Notified: Dr. Roslynn Amble  Orders Received? Or Actions Taken?: Orders Received - See Orders for details

## 2020-02-25 NOTE — ED Triage Notes (Addendum)
Pt arrives [facility] from home with complaints of weakness and poor PO intake over the last 2 weeks. Pt has advanced dementia. Pt still lives at home alone with family checking in on her periodically. Per family: Pt is not eating or drinking recently. Pt denies any other complaints. EMS administered 200cc of fluid

## 2020-02-26 ENCOUNTER — Inpatient Hospital Stay (HOSPITAL_COMMUNITY): Payer: Medicare Other

## 2020-02-26 ENCOUNTER — Other Ambulatory Visit: Payer: Self-pay

## 2020-02-26 DIAGNOSIS — E870 Hyperosmolality and hypernatremia: Secondary | ICD-10-CM

## 2020-02-26 DIAGNOSIS — L8990 Pressure ulcer of unspecified site, unspecified stage: Secondary | ICD-10-CM | POA: Insufficient documentation

## 2020-02-26 LAB — BASIC METABOLIC PANEL
Anion gap: 15 (ref 5–15)
Anion gap: 10 (ref 5–15)
Anion gap: 8 (ref 5–15)
Anion gap: 11 (ref 5–15)
BUN: 67 mg/dL — ABNORMAL HIGH (ref 8–23)
BUN: 65 mg/dL — ABNORMAL HIGH (ref 8–23)
BUN: 63 mg/dL — ABNORMAL HIGH (ref 8–23)
BUN: 53 mg/dL — ABNORMAL HIGH (ref 8–23)
CO2: 22 mmol/L (ref 22–32)
CO2: 23 mmol/L (ref 22–32)
CO2: 27 mmol/L (ref 22–32)
CO2: 25 mmol/L (ref 22–32)
Calcium: 9.2 mg/dL (ref 8.9–10.3)
Calcium: 8.9 mg/dL (ref 8.9–10.3)
Calcium: 8.8 mg/dL — ABNORMAL LOW (ref 8.9–10.3)
Calcium: 8.4 mg/dL — ABNORMAL LOW (ref 8.9–10.3)
Chloride: 123 mmol/L — ABNORMAL HIGH (ref 98–111)
Chloride: 125 mmol/L — ABNORMAL HIGH (ref 98–111)
Chloride: 122 mmol/L — ABNORMAL HIGH (ref 98–111)
Chloride: 121 mmol/L — ABNORMAL HIGH (ref 98–111)
Creatinine, Ser: 4.48 mg/dL — ABNORMAL HIGH (ref 0.44–1.00)
Creatinine, Ser: 4.33 mg/dL — ABNORMAL HIGH (ref 0.44–1.00)
Creatinine, Ser: 3.8 mg/dL — ABNORMAL HIGH (ref 0.44–1.00)
Creatinine, Ser: 3.46 mg/dL — ABNORMAL HIGH (ref 0.44–1.00)
GFR calc Af Amer: 10 mL/min — ABNORMAL LOW (ref >60)
GFR calc Af Amer: 11 mL/min — ABNORMAL LOW (ref >60)
GFR calc Af Amer: 13 mL/min — ABNORMAL LOW (ref >60)
GFR calc Af Amer: 14 mL/min — ABNORMAL LOW (ref >60)
GFR calc non Af Amer: 9 mL/min — ABNORMAL LOW (ref >60)
GFR calc non Af Amer: 9 mL/min — ABNORMAL LOW (ref >60)
GFR calc non Af Amer: 11 mL/min — ABNORMAL LOW (ref >60)
GFR calc non Af Amer: 12 mL/min — ABNORMAL LOW (ref >60)
Glucose, Bld: 84 mg/dL (ref 70–99)
Glucose, Bld: 118 mg/dL — ABNORMAL HIGH (ref 70–99)
Glucose, Bld: 131 mg/dL — ABNORMAL HIGH (ref 70–99)
Glucose, Bld: 131 mg/dL — ABNORMAL HIGH (ref 70–99)
Potassium: 3.6 mmol/L (ref 3.5–5.1)
Potassium: 3.5 mmol/L (ref 3.5–5.1)
Potassium: 3.1 mmol/L — ABNORMAL LOW (ref 3.5–5.1)
Potassium: 3.4 mmol/L — ABNORMAL LOW (ref 3.5–5.1)
Sodium: 160 mmol/L — ABNORMAL HIGH (ref 135–145)
Sodium: 158 mmol/L — ABNORMAL HIGH (ref 135–145)
Sodium: 157 mmol/L — ABNORMAL HIGH (ref 135–145)
Sodium: 157 mmol/L — ABNORMAL HIGH (ref 135–145)

## 2020-02-26 LAB — CBC
HCT: 43.9 % (ref 36.0–46.0)
HCT: 41.9 % (ref 36.0–46.0)
Hemoglobin: 13.2 g/dL (ref 12.0–15.0)
Hemoglobin: 12.8 g/dL (ref 12.0–15.0)
MCH: 28.4 pg (ref 26.0–34.0)
MCH: 29 pg (ref 26.0–34.0)
MCHC: 30.1 g/dL (ref 30.0–36.0)
MCHC: 30.5 g/dL (ref 30.0–36.0)
MCV: 94.4 fL (ref 80.0–100.0)
MCV: 94.8 fL (ref 80.0–100.0)
Platelets: 138 10*3/uL — ABNORMAL LOW (ref 150–400)
Platelets: 136 10*3/uL — ABNORMAL LOW (ref 150–400)
RBC: 4.65 MIL/uL (ref 3.87–5.11)
RBC: 4.42 MIL/uL (ref 3.87–5.11)
RDW: 15 % (ref 11.5–15.5)
RDW: 14.9 % (ref 11.5–15.5)
WBC: 7.9 10*3/uL (ref 4.0–10.5)
WBC: 7.9 10*3/uL (ref 4.0–10.5)
nRBC: 0 % (ref 0.0–0.2)
nRBC: 0 % (ref 0.0–0.2)

## 2020-02-26 LAB — OSMOLALITY, URINE: Osmolality, Ur: 624 mOsm/kg (ref 300–900)

## 2020-02-26 LAB — SARS CORONAVIRUS 2 (TAT 6-24 HRS): SARS Coronavirus 2: NEGATIVE

## 2020-02-26 LAB — PHOSPHORUS: Phosphorus: 3.7 mg/dL (ref 2.5–4.6)

## 2020-02-26 LAB — MAGNESIUM: Magnesium: 2.8 mg/dL — ABNORMAL HIGH (ref 1.7–2.4)

## 2020-02-26 MED ORDER — SODIUM CHLORIDE 0.9 % IV SOLN: INTRAVENOUS | Status: DC

## 2020-02-26 MED ORDER — HEPARIN SODIUM (PORCINE) 5000 UNIT/ML IJ SOLN
5000.0000 [IU] | Freq: Three times a day (TID) | INTRAMUSCULAR | Status: DC
  Administered 2020-02-26 – 2020-03-03 (×16): 5000 [IU] via SUBCUTANEOUS
  Filled 2020-02-26 (×15): qty 1

## 2020-02-26 MED ORDER — QUETIAPINE FUMARATE 25 MG PO TABS
25.0000 mg | ORAL_TABLET | Freq: Every day | ORAL | Status: DC
  Administered 2020-02-26: 25 mg via ORAL
  Filled 2020-02-26: qty 1

## 2020-02-26 MED ORDER — DONEPEZIL HCL 10 MG PO TABS
10.0000 mg | ORAL_TABLET | Freq: Every day | ORAL | Status: DC
  Administered 2020-02-26 – 2020-03-01 (×5): 10 mg via ORAL
  Filled 2020-02-26 (×5): qty 1

## 2020-02-26 MED ORDER — DEXTROSE-NACL 5-0.45 % IV SOLN: INTRAVENOUS | Status: DC

## 2020-02-26 MED ORDER — SODIUM CHLORIDE 0.9 % IV SOLN: 1.0000 g | INTRAVENOUS | Status: DC

## 2020-02-26 MED ORDER — LORAZEPAM 0.5 MG PO TABS
0.5000 mg | ORAL_TABLET | Freq: Three times a day (TID) | ORAL | Status: DC | PRN
  Administered 2020-02-26: 0.5 mg via ORAL
  Filled 2020-02-26: qty 1

## 2020-02-26 MED ORDER — POTASSIUM CHLORIDE CRYS ER 20 MEQ PO TBCR
40.0000 meq | EXTENDED_RELEASE_TABLET | Freq: Once | ORAL | Status: AC
  Administered 2020-02-26: 40 meq via ORAL
  Filled 2020-02-26: qty 2

## 2020-02-26 NOTE — ED Notes (Signed)
Daughter at bedside.  Pt pulled out IB from right wrist. New IV placed to left wrist. Coban and mittens applied.  Pt continues to pick at sore on bilateral arms.

## 2020-02-26 NOTE — ED Notes (Signed)
Attempted to place pt back on monitor, but pt removes it.  Pt placed back on fall alarm pad for safety of pt.  Asked pt if needed BR assistance while writer was at bedside but pt refused. Pt has call bell within reach.

## 2020-02-26 NOTE — Evaluation (Signed)
Clinical/Bedside Swallow Evaluation Patient Details  Name: Katie Jackson MRN: [id number] Date of Birth: [birth year]  Today's Date: 02/26/2020 Time: SLP Start Time (ACUTE ONLY): [id number] SLP Stop Time (ACUTE ONLY): 1702 SLP Time Calculation (min) (ACUTE ONLY): 12 min  Past Medical History:  Past Medical History:  Diagnosis Date  . High cholesterol   . Hypertension    Past Surgical History:  Past Surgical History:  Procedure Laterality Date  . BREAST BIOPSY    . cataract Bilateral    HPI:  74 year old female with history of dementia, hypertension, hyperlipidemia.  Per admission history, patient had poor p.o. intake in the last 2 weeks, more confused, feeling weak and reported difficulty swallowing. Dx AKI, hypernatremia with severe dehydration, acute encephalopathy   Assessment / Plan / Recommendation Clinical Impression  Pt presents with quite functional swallowing.  She is confused, but pleasant and interactive.  With dinner tray placed in front of her and meat cut, pt was able to feed herself regular solids and drink thin liquids with good functionality.  Mastication was slow, but not impaired.  There were no s/s of aspiration.  Oral inspection intermittently revealed no substantial oral residue.  Recommend continuing current HH diet, thin liquids; assist with tray set-up but allow pt to feed herself.  She will need encouragement to eat.  No SLP needs are identified - our service will sign off.  SLP Visit Diagnosis: Dysphagia, unspecified (R13.10)    Aspiration Risk  No limitations    Diet Recommendation   regular solids, thin liquids  Medication Administration: Whole meds with liquid    Other  Recommendations Oral Care Recommendations: Oral care BID   Follow up Recommendations None      Frequency and Duration            Prognosis        Swallow Study   General HPI: 74 year old female with history of dementia, hypertension, hyperlipidemia.  Per admission history,  patient had poor p.o. intake in the last 2 weeks, more confused, feeling weak and reported difficulty swallowing. Dx AKI, hypernatremia with severe dehydration, acute encephalopathy Type of Study: Bedside Swallow Evaluation Previous Swallow Assessment: none per records Diet Prior to this Study: Regular;Thin liquids Temperature Spikes Noted: No Respiratory Status: Room air History of Recent Intubation: No Behavior/Cognition: Alert;Confused Oral Cavity Assessment: Within Functional Limits Oral Care Completed by SLP: No Oral Cavity - Dentition: Dentures, top;Dentures, bottom Vision: Functional for self-feeding Self-Feeding Abilities: Able to feed self Patient Positioning: Upright in bed Baseline Vocal Quality: Normal Volitional Cough: Strong Volitional Swallow: Able to elicit    Oral/Motor/Sensory Function Overall Oral Motor/Sensory Function: Within functional limits   Ice Chips Ice chips: Within functional limits   Thin Liquid Thin Liquid: Within functional limits    Nectar Thick Nectar Thick Liquid: Not tested   Honey Thick Honey Thick Liquid: Not tested   Puree Puree: Within functional limits   Solid     Solid: Within functional limits      Juan Quam Laurice 02/26/2020,5:16 PM   Estill Bamberg L. Tivis Ringer, [place] Office number [phone] Pager [phone]

## 2020-02-26 NOTE — ED Notes (Addendum)
Patient repositioned in bed and given meal tray.  Bed alarm repositioned under patient.

## 2020-02-26 NOTE — ED Notes (Signed)
DAUGHTER AT BED SIDE PATIENT GIVEN LUNCH TRAY DAUGHTER TO HELP FEED

## 2020-02-26 NOTE — ED Notes (Signed)
Cardiac monitoring reapplied but pt removed.

## 2020-02-26 NOTE — Progress Notes (Signed)
[facility]                                                                              Patient Demographics  Katie Jackson, is a 74 y.o. female, DOB - December 31, 1945, [id number]  Admit date - 02/25/2020   Admitting Physician Bonnell Public, MD  Outpatient Primary MD for the patient is Wenda Low, MD  Outpatient specialists:   LOS - 1  days   Medical records reviewed and are as summarized below:    Chief Complaint  Patient presents with  . Weakness  . Failure To Thrive       Brief summary   Patient is a 74 year old female with history of dementia, hypertension, hyperlipidemia.  Per admission history, patient had poor p.o. intake in the last 2 weeks, more confused, feeling weak and reported difficulty swallowing. Patient was found to have sodium of 161, potassium 3.2, BUN 71, creatinine 5.25, up from 1.07 on 11/05/2017.  UA positive for UTI, ketones.  Assessment & Plan    Principal problem   AKI (acute kidney injury) ([place])  -Unclear baseline, creatinine was 1.07 in 11/05/2017, may have developed CKD in the interim -Renal ultrasound showed no obstruction or hydronephrosis. - Patient presented with creatinine of 5.25 on admission, improving, 3.8 -Continue IV fluid hydration -Avoid nephrotoxic medications  Acute hypernatremia with severe dehydration -Sodium 161 on admission, IV fluids changed to D5 half-normal saline -Sodium improving, 157  Acute encephalopathy on dementia with agitation -Likely worsened due to acute kidney injury, acute hyponatremia, UTI -Air cabin crew, restart Aricept -Start on low-dose of Seroquel at bedtime  Hypokalemia Replaced  UTI -Continue IV Rocephin, follow urine culture and sensitivities   Reported dysphagia: -SLP evaluation   Failure to thrive, underlying dementia, falls -Palliative consult for goals of care  Pressure injury Mid sacrum stage I Wound care per nursing   Severe protein calorie  malnutrition Estimated body mass index is 17.89 kg/m as calculated from the following:   Height as of this encounter: 5\' 3"  (1.6 m).   Weight as of this encounter: 45.8 kg.  Dietitian consult  Code Status: Full CODE STATUS DVT Prophylaxis: Heparin subcu Family Communication: Discussed all imaging results, lab results, explained to the patient   Disposition Plan: Patient from home, likely will need higher level of care.  Once acute issues are stabilized, will start PT tomorrow to assess functional status   Time Spent in minutes   35 minutes  Procedures:  None  Consultants:   Palliative medicine  Antimicrobials:   Anti-infectives (From admission, onward)   Start     Dose/Rate Route Frequency Ordered Stop   02/26/20 0421  cefTRIAXone (ROCEPHIN) 1 g in sodium chloride 0.9 % 100 mL IVPB  Status:  Discontinued     1 g 200 mL/hr over 30 Minutes Intravenous Every 24 hours 02/26/20 0421 02/26/20 0422   02/25/20 1900  cefTRIAXone (ROCEPHIN) 1 g in sodium chloride 0.9 % 100 mL IVPB     1 g 200 mL/hr over 30 Minutes Intravenous Every 24 hours 02/25/20 1830            Medications  Scheduled Meds: .  heparin  5,000 Units Subcutaneous Q8H   Continuous Infusions: . cefTRIAXone (ROCEPHIN)  IV Stopped (02/26/20 0252)  . dextrose 5 % and 0.45% NaCl 100 mL/hr at 02/26/20 1353   PRN Meds:.      Subjective:   Katie Jackson was seen and examined today.  Oriented to time and place, alert.  No fevers or chills.  No acute complaints of any pain nausea or vomiting.  Patient denies dizziness, chest pain, shortness of breath, abdominal pain, N/V/D/C, new weakness, numbess, tingling. No acute events overnight.    Objective:   Vitals:   02/26/20 1249 02/26/20 1251 02/26/20 1329 02/26/20 1348  BP:  108/68 (!) 126/59   Pulse: 84 79 84   Resp: 19 15 20    Temp:   98.4 F (36.9 C)   TempSrc:      SpO2: 98% 100% 100%   Weight:      Height:    5\' 3"  (1.6 m)    Intake/Output Summary  (Last 24 hours) at 02/26/2020 1620 Last data filed at 02/26/2020 1400 Gross per 24 hour  Intake 615.14 ml  Output --  Net 615.14 ml     Wt Readings from Last 3 Encounters:  02/25/20 45.8 kg  08/02/18 51.7 kg  06/10/18 54.9 kg     Exam  General: Alert and oriented x self and place, NAD  Cardiovascular: S1 S2 auscultated, no murmurs, RRR  Respiratory: Clear to auscultation bilaterally, no wheezing, rales or rhonchi  Gastrointestinal: Soft, nontender, nondistended, + bowel sounds  Ext: no pedal edema bilaterally  Neuro: Moving all 4 extremities  Musculoskeletal: No digital cyanosis, clubbing  Skin: Multiple wounds and abrasions on the extremities from falls  Psych: Dementia, still somewhat confused   Data Reviewed:  I have personally reviewed following labs and imaging studies  Micro Results Recent Results (from the past 240 hour(s))  SARS CORONAVIRUS 2 (TAT 6-24 HRS) Nasopharyngeal Nasopharyngeal Swab     Status: None   Collection Time: 02/25/20  5:14 PM   Specimen: Nasopharyngeal Swab  Result Value Ref Range Status   SARS Coronavirus 2 NEGATIVE NEGATIVE Final    Comment: (NOTE) SARS-CoV-2 target nucleic acids are NOT DETECTED. The SARS-CoV-2 RNA is generally detectable in upper and lower respiratory specimens during the acute phase of infection. Negative results do not preclude SARS-CoV-2 infection, do not rule out co-infections with other pathogens, and should not be used as the sole basis for treatment or other patient management decisions. Negative results must be combined with clinical observations, patient history, and epidemiological information. The expected result is Negative. Fact Sheet for Patients: SugarRoll.be Fact Sheet for Healthcare Providers: https://www.woods-mathews.com/ This test is not yet approved or cleared by the [place] FDA and  has been authorized for detection and/or diagnosis of  SARS-CoV-2 by FDA under an Emergency Use Authorization (EUA). This EUA will remain  in effect (meaning this test can be used) for the duration of the COVID-19 declaration under Section 56 4(b)(1) of the Act, 21 U.S.C. section 360bbb-3(b)(1), unless the authorization is terminated or revoked sooner. Performed at [facility], [place] [address] [place], [place] [address]     Radiology Reports DG Chest 2 View  Result Date: 02/25/2020 CLINICAL DATA:  Weakness, dementia EXAM: CHEST - 2 VIEW COMPARISON:  09/18/2008 FINDINGS: Frontal and lateral views of the chest demonstrate an unremarkable cardiac silhouette. Lungs are hyperinflated without airspace disease, effusion, or pneumothorax. No acute bony abnormalities. IMPRESSION: 1. No acute intrathoracic process. 2. Emphysema. Electronically  Signed   By: Randa Ngo M.D.   On: 02/25/2020 16:11   US RENAL  Result Date: 02/26/2020 CLINICAL DATA:  Acute kidney injury EXAM: RENAL / URINARY TRACT ULTRASOUND COMPLETE COMPARISON:  None. FINDINGS: Right Kidney: Renal measurements: 7.4 x 3.6 x 3.8 cm = volume: 52 mL. Generalized cortical thinning. No hydronephrosis or mass. Left Kidney: Renal measurements: 7.7 x 3.6 x 4.3 cm = volume: 61 mL. Generalized cortical thinning. No hydronephrosis or mass. Bladder: Appears normal for degree of bladder distention. IMPRESSION: 1. Symmetric renal atrophy. 2. No hydronephrosis or reversible finding. Electronically Signed   By: Monte Fantasia M.D.   On: 02/26/2020 04:59    Lab Data:  CBC: Recent Labs  Lab 02/25/20 1558 02/26/20 0250 02/26/20 1451  WBC 9.3 7.9 7.9  NEUTROABS 6.3  --   --   HGB 13.9 13.2 12.8  HCT 46.2* 43.9 41.9  MCV 94.7 94.4 94.8  PLT 156 138* [id number]*   Basic Metabolic Panel: Recent Labs  Lab 02/25/20 1558 02/25/20 1714 02/26/20 0250 02/26/20 0914 02/26/20 1451  NA 158* 161* 160* 158* 157*  K 4.5 3.2* 3.6 3.5 3.1*  CL 122* 121* 123* 125* 122*  CO2 23 26 22 23 27   GLUCOSE 86 95  84 118* 131*  BUN 69* 71* 67* 65* 63*  CREATININE 5.29* 5.25* 4.48* 4.33* 3.80*  CALCIUM 9.2 9.5 9.2 8.9 8.8*  MG  --   --  2.8*  --   --   PHOS  --   --  3.7  --   --    GFR: Estimated Creatinine Clearance: 9.2 mL/min (A) (by C-G formula based on SCr of 3.8 mg/dL (H)). Liver Function Tests: Recent Labs  Lab 02/25/20 1558  AST 22  ALT 13  ALKPHOS 43  BILITOT 1.5*  PROT 6.4*  ALBUMIN 3.5   No results for input(s): LIPASE, AMYLASE in the last 168 hours. No results for input(s): AMMONIA in the last 168 hours. Coagulation Profile: No results for input(s): INR, PROTIME in the last 168 hours. Cardiac Enzymes: No results for input(s): CKTOTAL, CKMB, CKMBINDEX, TROPONINI in the last 168 hours. BNP (last 3 results) No results for input(s): PROBNP in the last 8760 hours. HbA1C: No results for input(s): HGBA1C in the last 72 hours. CBG: No results for input(s): GLUCAP in the last 168 hours. Lipid Profile: No results for input(s): CHOL, HDL, LDLCALC, TRIG, CHOLHDL, LDLDIRECT in the last 72 hours. Thyroid Function Tests: No results for input(s): TSH, T4TOTAL, FREET4, T3FREE, THYROIDAB in the last 72 hours. Anemia Panel: No results for input(s): VITAMINB12, FOLATE, FERRITIN, TIBC, IRON, RETICCTPCT in the last 72 hours. Urine analysis:    Component Value Date/Time   COLORURINE YELLOW 02/25/2020 1714   APPEARANCEUR TURBID (A) 02/25/2020 1714   LABSPEC 1.016 02/25/2020 1714   PHURINE 8.0 02/25/2020 1714   GLUCOSEU NEGATIVE 02/25/2020 1714   HGBUR NEGATIVE 02/25/2020 1714   BILIRUBINUR NEGATIVE 02/25/2020 1714   KETONESUR 5 (A) 02/25/2020 1714   PROTEINUR >=300 (A) 02/25/2020 1714   NITRITE NEGATIVE 02/25/2020 1714   LEUKOCYTESUR MODERATE (A) 02/25/2020 1714     Katherleen Folkes M.D. [facility] 02/26/2020, 4:20 PM   Call night coverage person covering after 7pm

## 2020-02-26 NOTE — ED Notes (Signed)
Writer heard bed alarm ringing out and found pt getting out of bed. Pt stated she needed to go to the BR. Pt ambulated to the BR with x1 assist and back to room. Pt back in bed at this time with fall alarm under pt and turned on. Pt has call bell within reach.

## 2020-02-26 NOTE — ED Notes (Signed)
Entered patient room to find that she had removed IV and disconnected self from all monitors.

## 2020-02-26 NOTE — ED Notes (Signed)
Pure wick applied to patient. Patient encouraged to void.

## 2020-02-26 NOTE — ED Notes (Signed)
Per night shift RN patient continues to pull off EKG leads, BP, and pulse o2 monitor.

## 2020-02-26 NOTE — ED Notes (Signed)
Bed alarm went off. This RN checked on pt. Pt attempting to get out of bed. Pt redirected to bed. Patient given call bell and t.v turned on for distraction.   Patient verbalized understanding and demonstrated how to press the call bell for help.

## 2020-02-27 DIAGNOSIS — Z515 Encounter for palliative care: Secondary | ICD-10-CM

## 2020-02-27 DIAGNOSIS — N390 Urinary tract infection, site not specified: Secondary | ICD-10-CM

## 2020-02-27 DIAGNOSIS — N179 Acute kidney failure, unspecified: Principal | ICD-10-CM

## 2020-02-27 DIAGNOSIS — R319 Hematuria, unspecified: Secondary | ICD-10-CM

## 2020-02-27 DIAGNOSIS — Z7189 Other specified counseling: Secondary | ICD-10-CM

## 2020-02-27 DIAGNOSIS — L89000 Pressure ulcer of unspecified elbow, unstageable: Secondary | ICD-10-CM

## 2020-02-27 LAB — BASIC METABOLIC PANEL
Anion gap: 6 (ref 5–15)
Anion gap: 8 (ref 5–15)
Anion gap: 8 (ref 5–15)
BUN: 48 mg/dL — ABNORMAL HIGH (ref 8–23)
BUN: 43 mg/dL — ABNORMAL HIGH (ref 8–23)
BUN: 35 mg/dL — ABNORMAL HIGH (ref 8–23)
CO2: 23 mmol/L (ref 22–32)
CO2: 24 mmol/L (ref 22–32)
CO2: 23 mmol/L (ref 22–32)
Calcium: 8.3 mg/dL — ABNORMAL LOW (ref 8.9–10.3)
Calcium: 8.3 mg/dL — ABNORMAL LOW (ref 8.9–10.3)
Calcium: 7.9 mg/dL — ABNORMAL LOW (ref 8.9–10.3)
Chloride: 125 mmol/L — ABNORMAL HIGH (ref 98–111)
Chloride: 123 mmol/L — ABNORMAL HIGH (ref 98–111)
Chloride: 121 mmol/L — ABNORMAL HIGH (ref 98–111)
Creatinine, Ser: 3.02 mg/dL — ABNORMAL HIGH (ref 0.44–1.00)
Creatinine, Ser: 2.79 mg/dL — ABNORMAL HIGH (ref 0.44–1.00)
Creatinine, Ser: 2.54 mg/dL — ABNORMAL HIGH (ref 0.44–1.00)
GFR calc Af Amer: 17 mL/min — ABNORMAL LOW (ref >60)
GFR calc Af Amer: 18 mL/min — ABNORMAL LOW (ref >60)
GFR calc Af Amer: 21 mL/min — ABNORMAL LOW (ref >60)
GFR calc non Af Amer: 14 mL/min — ABNORMAL LOW (ref >60)
GFR calc non Af Amer: 16 mL/min — ABNORMAL LOW (ref >60)
GFR calc non Af Amer: 18 mL/min — ABNORMAL LOW (ref >60)
Glucose, Bld: 93 mg/dL (ref 70–99)
Glucose, Bld: 94 mg/dL (ref 70–99)
Glucose, Bld: 103 mg/dL — ABNORMAL HIGH (ref 70–99)
Potassium: 3.4 mmol/L — ABNORMAL LOW (ref 3.5–5.1)
Potassium: 3.6 mmol/L (ref 3.5–5.1)
Potassium: 3.5 mmol/L (ref 3.5–5.1)
Sodium: 154 mmol/L — ABNORMAL HIGH (ref 135–145)
Sodium: 155 mmol/L — ABNORMAL HIGH (ref 135–145)
Sodium: 152 mmol/L — ABNORMAL HIGH (ref 135–145)

## 2020-02-27 LAB — URINE CULTURE

## 2020-02-27 MED ORDER — QUETIAPINE FUMARATE 25 MG PO TABS
12.5000 mg | ORAL_TABLET | Freq: Every day | ORAL | Status: DC
  Administered 2020-02-27: 12.5 mg via ORAL
  Filled 2020-02-27: qty 1

## 2020-02-27 NOTE — Care Management Important Message (Signed)
Important Message  Patient Details IM Letter given to [place] Case Manager to present to the Patient Name: Katie Jackson MRN: [id number] Date of Birth: May 28, 1946   Medicare Important Message Given:  Yes     Kerin Salen 02/27/2020, 11:08 AM

## 2020-02-27 NOTE — Progress Notes (Signed)
PT Cancellation Note  Patient Details Name: Normandie Blanks MRN: [id number] DOB: 1946/01/05   Cancelled Treatment:    Reason Eval/Treat Not Completed: Patient's level of consciousness;Other (comment)(According to nursing personnel, she becomes agitated and combative when awake- that she was sleeping and they had no sitter for her. Asked that PT not see her today.) Rollen Sox, PT # [phone] CGV cell  Casandra Doffing 02/27/2020, 2:10 PM

## 2020-02-27 NOTE — Progress Notes (Signed)
Pt's daughter present at bedside wanting to know why patient has been asleep for the duration of the day without eating. Advised pt has been drowsy due to medication. Daughter has requested for sedatives to be discontinued. Pt's vitals have been stable, pt is alert & oriented now. MD notified.

## 2020-02-27 NOTE — Progress Notes (Signed)
[facility]                                                                              Patient Demographics  Katie Jackson, is a 74 y.o. female, DOB - Oct 01, 1946, [id number]  Admit date - 02/25/2020   Admitting Physician Katie Public, MD  Outpatient Primary MD for the patient is Katie Low, MD  Outpatient specialists:   LOS - 2  days   Medical records reviewed and are as summarized below:    Chief Complaint  Patient presents with  . Weakness  . Failure To Thrive       Brief summary   Patient is a 74 year old female with history of dementia, hypertension, hyperlipidemia.  Per admission history, patient had poor p.o. intake in the last 2 weeks, more confused, feeling weak and reported difficulty swallowing. Patient was found to have sodium of 161, potassium 3.2, BUN 71, creatinine 5.25, up from 1.07 on 11/05/2017.  UA positive for UTI, ketones.  Assessment & Plan    Principal problem   AKI (acute kidney injury) ([place])  -Unclear baseline, creatinine was 1.07 in 11/05/2017, may have developed CKD in the interim -Renal ultrasound showed no obstruction or hydronephrosis. - Patient presented with creatinine of 5.25 on admission, improving, currently 2.79 -Continue IV fluid hydration, sodium improving  Acute hypernatremia with severe dehydration -Sodium 161 on admission, continue D5 half-normal saline -Sodium improving, 155  Acute encephalopathy on dementia with agitation -Likely worsened due to acute kidney injury, acute hyponatremia, UTI -Air cabin crew, restart Aricept -Patient too sleepy today, decrease the Seroquel to 12.5 mg nightly -DC Ativan due to hypersomnolence  Hypokalemia Replaced  UTI -Continue IV Rocephin x3 days    Reported dysphagia: -SLP evaluation   Failure to thrive, underlying dementia, falls -Palliative medicine consulted for goals of care  Pressure injury Mid sacrum stage I Wound care per  nursing   Severe protein calorie malnutrition Estimated body mass index is 17.89 kg/m as calculated from the following:   Height as of this encounter: 5\' 3"  (1.6 m).   Weight as of this encounter: 45.8 kg.  Dietitian consult  Code Status: Full CODE STATUS DVT Prophylaxis: Heparin subcu Family Communication: No family at the bedside  Disposition Plan: Patient from home, likely will need higher level of care.  Once acute issues are stabilized, placed PT to assess functional status however patient is too sleepy   Time Spent in minutes   35 minutes  Procedures:  None  Consultants:   Palliative medicine  Antimicrobials:   Anti-infectives (From admission, onward)   Start     Dose/Rate Route Frequency Ordered Stop   02/26/20 0421  cefTRIAXone (ROCEPHIN) 1 g in sodium chloride 0.9 % 100 mL IVPB  Status:  Discontinued     1 g 200 mL/hr over 30 Minutes Intravenous Every 24 hours 02/26/20 0421 02/26/20 0422   02/25/20 1900  cefTRIAXone (ROCEPHIN) 1 g in sodium chloride 0.9 % 100 mL IVPB     1 g 200 mL/hr over 30 Minutes Intravenous Every 24 hours 02/25/20 1830           Medications  Scheduled  Meds: . donepezil  10 mg Oral QHS  . heparin  5,000 Units Subcutaneous Q8H  . QUEtiapine  12.5 mg Oral QHS   Continuous Infusions: . cefTRIAXone (ROCEPHIN)  IV Stopped (02/26/20 1831)  . dextrose 5 % and 0.45% NaCl 100 mL/hr at 02/27/20 0538   PRN Meds:.      Subjective:   Katie Jackson was seen and examined today.  Overnight was agitated, this morning too sleepy not following commands.  Unable to obtain review of system from the patient  Objective:   Vitals:   02/26/20 1329 02/26/20 1348 02/26/20 2012 02/27/20 0545  BP: (!) 126/59  112/66 127/63  Pulse: 84  78 80  Resp: 20  18 18   Temp: 98.4 F (36.9 C)  97.7 F (36.5 C) 98.3 F (36.8 C)  TempSrc:   Oral Oral  SpO2: 100%  100% 98%  Weight:      Height:  5\' 3"  (1.6 m)      Intake/Output Summary (Last 24 hours)  at 02/27/2020 1417 Last data filed at 02/27/2020 1403 Gross per 24 hour  Intake 1124.92 ml  Output --  Net 1124.92 ml     Wt Readings from Last 3 Encounters:  02/25/20 45.8 kg  08/02/18 51.7 kg  06/10/18 54.9 kg   Physical Exam  General: Somnolent, NAD  Cardiovascular: S1 S2 clear, RRR. No pedal edema b/l  Respiratory: CTAB, no wheezing, rales or rhonchi  Gastrointestinal: Soft, nontender, nondistended, NBS  Ext: no pedal edema bilaterally  Neuro: Unable to assess does not follow commands  Musculoskeletal: No cyanosis, clubbing  Skin: No rashes  Psych: Dementia, somnolent     Data Reviewed:  I have personally reviewed following labs and imaging studies  Micro Results Recent Results (from the past 240 hour(s))  SARS CORONAVIRUS 2 (TAT 6-24 HRS) Nasopharyngeal Nasopharyngeal Swab     Status: None   Collection Time: 02/25/20  5:14 PM   Specimen: Nasopharyngeal Swab  Result Value Ref Range Status   SARS Coronavirus 2 NEGATIVE NEGATIVE Final    Comment: (NOTE) SARS-CoV-2 target nucleic acids are NOT DETECTED. The SARS-CoV-2 RNA is generally detectable in upper and lower respiratory specimens during the acute phase of infection. Negative results do not preclude SARS-CoV-2 infection, do not rule out co-infections with other pathogens, and should not be used as the sole basis for treatment or other patient management decisions. Negative results must be combined with clinical observations, patient history, and epidemiological information. The expected result is Negative. Fact Sheet for Patients: SugarRoll.be Fact Sheet for Healthcare Providers: https://www.woods-mathews.com/ This test is not yet approved or cleared by the [place] FDA and  has been authorized for detection and/or diagnosis of SARS-CoV-2 by FDA under an Emergency Use Authorization (EUA). This EUA will remain  in effect (meaning this test can be used) for  the duration of the COVID-19 declaration under Section 56 4(b)(1) of the Act, 21 U.S.C. section 360bbb-3(b)(1), unless the authorization is terminated or revoked sooner. Performed at [facility], [place] [address], [place], [place] [address]   Urine culture     Status: Abnormal   Collection Time: 02/25/20  5:14 PM   Specimen: Urine, Clean Catch  Result Value Ref Range Status   Specimen Description   Final    URINE, CLEAN CATCH Performed at [facility], [place] [address], [place], [place] [address]    Special Requests   Final    NONE Performed at [facility], [place]  [address], [place], [place] [address]    Culture MULTIPLE SPECIES PRESENT, SUGGEST RECOLLECTION (A)  Final   Report Status 02/27/2020 FINAL  Final    Radiology Reports DG Chest 2 View  Result Date: 02/25/2020 CLINICAL DATA:  Weakness, dementia EXAM: CHEST - 2 VIEW COMPARISON:  09/18/2008 FINDINGS: Frontal and lateral views of the chest demonstrate an unremarkable cardiac silhouette. Lungs are hyperinflated without airspace disease, effusion, or pneumothorax. No acute bony abnormalities. IMPRESSION: 1. No acute intrathoracic process. 2. Emphysema. Electronically Signed   By: Randa Ngo M.D.   On: 02/25/2020 16:11   US RENAL  Result Date: 02/26/2020 CLINICAL DATA:  Acute kidney injury EXAM: RENAL / URINARY TRACT ULTRASOUND COMPLETE COMPARISON:  None. FINDINGS: Right Kidney: Renal measurements: 7.4 x 3.6 x 3.8 cm = volume: 52 mL. Generalized cortical thinning. No hydronephrosis or mass. Left Kidney: Renal measurements: 7.7 x 3.6 x 4.3 cm = volume: 61 mL. Generalized cortical thinning. No hydronephrosis or mass. Bladder: Appears normal for degree of bladder distention. IMPRESSION: 1. Symmetric renal atrophy. 2. No hydronephrosis or reversible finding. Electronically Signed   By: Monte Fantasia M.D.   On: 02/26/2020 04:59    Lab Data:  CBC: Recent Labs  Lab 02/25/20 1558  02/26/20 0250 02/26/20 1451  WBC 9.3 7.9 7.9  NEUTROABS 6.3  --   --   HGB 13.9 13.2 12.8  HCT 46.2* 43.9 41.9  MCV 94.7 94.4 94.8  PLT 156 138* [id number]*   Basic Metabolic Panel: Recent Labs  Lab 02/26/20 0250 02/26/20 0250 02/26/20 0914 02/26/20 1451 02/26/20 2054 02/27/20 0536 02/27/20 1318  NA 160*   < > 158* 157* 157* 154* 155*  K 3.6   < > 3.5 3.1* 3.4* 3.4* 3.6  CL 123*   < > 125* 122* 121* 125* 123*  CO2 22   < > 23 27 25 23 24   GLUCOSE 84   < > 118* 131* 131* 93 94  BUN 67*   < > 65* 63* 53* 48* 43*  CREATININE 4.48*   < > 4.33* 3.80* 3.46* 3.02* 2.79*  CALCIUM 9.2   < > 8.9 8.8* 8.4* 8.3* 8.3*  MG 2.8*  --   --   --   --   --   --   PHOS 3.7  --   --   --   --   --   --    < > = values in this interval not displayed.   GFR: Estimated Creatinine Clearance: 12.6 mL/min (A) (by C-G formula based on SCr of 2.79 mg/dL (H)). Liver Function Tests: Recent Labs  Lab 02/25/20 1558  AST 22  ALT 13  ALKPHOS 43  BILITOT 1.5*  PROT 6.4*  ALBUMIN 3.5   No results for input(s): LIPASE, AMYLASE in the last 168 hours. No results for input(s): AMMONIA in the last 168 hours. Coagulation Profile: No results for input(s): INR, PROTIME in the last 168 hours. Cardiac Enzymes: No results for input(s): CKTOTAL, CKMB, CKMBINDEX, TROPONINI in the last 168 hours. BNP (last 3 results) No results for input(s): PROBNP in the last 8760 hours. HbA1C: No results for input(s): HGBA1C in the last 72 hours. CBG: No results for input(s): GLUCAP in the last 168 hours. Lipid Profile: No results for input(s): CHOL, HDL, LDLCALC, TRIG, CHOLHDL, LDLDIRECT in the last 72 hours. Thyroid Function Tests: No results for input(s): TSH, T4TOTAL, FREET4, T3FREE, THYROIDAB in the last 72 hours. Anemia Panel: No results for input(s): VITAMINB12, FOLATE, FERRITIN, TIBC,  IRON, RETICCTPCT in the last 72 hours. Urine analysis:    Component Value Date/Time   COLORURINE YELLOW 02/25/2020 1714    APPEARANCEUR TURBID (A) 02/25/2020 1714   LABSPEC 1.016 02/25/2020 1714   PHURINE 8.0 02/25/2020 1714   GLUCOSEU NEGATIVE 02/25/2020 1714   HGBUR NEGATIVE 02/25/2020 1714   BILIRUBINUR NEGATIVE 02/25/2020 1714   KETONESUR 5 (A) 02/25/2020 1714   PROTEINUR >=300 (A) 02/25/2020 1714   NITRITE NEGATIVE 02/25/2020 1714   LEUKOCYTESUR MODERATE (A) 02/25/2020 1714     Hildegard Hlavac M.D. [facility] 02/27/2020, 2:17 PM   Call night coverage person covering after 7pm

## 2020-02-28 ENCOUNTER — Encounter (HOSPITAL_COMMUNITY): Payer: Self-pay | Admitting: Internal Medicine

## 2020-02-28 DIAGNOSIS — F03918 Unspecified dementia, unspecified severity, with other behavioral disturbance: Secondary | ICD-10-CM

## 2020-02-28 DIAGNOSIS — F0391 Unspecified dementia with behavioral disturbance: Secondary | ICD-10-CM

## 2020-02-28 LAB — BASIC METABOLIC PANEL
Anion gap: 7 (ref 5–15)
BUN: 28 mg/dL — ABNORMAL HIGH (ref 8–23)
CO2: 21 mmol/L — ABNORMAL LOW (ref 22–32)
Calcium: 7.7 mg/dL — ABNORMAL LOW (ref 8.9–10.3)
Chloride: 120 mmol/L — ABNORMAL HIGH (ref 98–111)
Creatinine, Ser: 2.38 mg/dL — ABNORMAL HIGH (ref 0.44–1.00)
GFR calc Af Amer: 22 mL/min — ABNORMAL LOW (ref >60)
GFR calc non Af Amer: 19 mL/min — ABNORMAL LOW (ref >60)
Glucose, Bld: 90 mg/dL (ref 70–99)
Potassium: 3.3 mmol/L — ABNORMAL LOW (ref 3.5–5.1)
Sodium: 148 mmol/L — ABNORMAL HIGH (ref 135–145)

## 2020-02-28 MED ORDER — DIPHENHYDRAMINE HCL 50 MG/ML IJ SOLN
12.5000 mg | Freq: Once | INTRAMUSCULAR | Status: AC
  Administered 2020-02-28: 12.5 mg via INTRAVENOUS
  Filled 2020-02-28: qty 1

## 2020-02-28 MED ORDER — SODIUM CHLORIDE 0.9% FLUSH
10.0000 mL | Freq: Two times a day (BID) | INTRAVENOUS | Status: DC
  Administered 2020-02-28: 10 mL

## 2020-02-28 MED ORDER — OLANZAPINE 2.5 MG PO TABS
2.5000 mg | ORAL_TABLET | Freq: Every day | ORAL | Status: DC
  Administered 2020-02-28: 2.5 mg via ORAL
  Filled 2020-02-28 (×2): qty 1

## 2020-02-28 MED ORDER — SODIUM CHLORIDE 0.9% FLUSH
10.0000 mL | INTRAVENOUS | Status: DC | PRN
  Administered 2020-02-29: 10 mL

## 2020-02-28 MED ORDER — POTASSIUM CHLORIDE CRYS ER 20 MEQ PO TBCR: 40.0000 meq | EXTENDED_RELEASE_TABLET | Freq: Once | ORAL | Status: DC

## 2020-02-28 MED ORDER — HALOPERIDOL LACTATE 5 MG/ML IJ SOLN
0.5000 mg | Freq: Four times a day (QID) | INTRAMUSCULAR | Status: DC | PRN
  Administered 2020-03-02 – 2020-03-03 (×3): 0.5 mg via INTRAVENOUS
  Filled 2020-02-28 (×3): qty 1

## 2020-02-28 NOTE — TOC Initial Note (Signed)
Transition of Care [place]) - Initial/Assessment Note    Patient Details  Name: Katie Jackson MRN: [id number] Date of Birth: August 02, 1946  [facility]) CM/SW Contact:    Trish Mage, LCSW Phone Number: 02/28/2020, 3:24 PM  Clinical Narrative:   CSW called daughter to get clarification on home situation. Patient lives alone in an apartment at [facility].  There is a Production assistant, radio, and emergency pull cords in the apartment.  Her daughter takes her food every AM and PM, and her granddaughter comes to check on her during the day most days.  Daughter is hoping for [facility] aide services, and she is also working with PCP to get her qualified for CAP services.  Patient has an appointment on the 17th, at which time daughter will deliver the paperwork for signature.  TOC will continue to follow during the course of hospitalization.               Expected Discharge Plan: [place] Barriers to Discharge: No Barriers Identified   Patient Goals and CMS Choice        Expected Discharge Plan and Services Expected Discharge Plan: [place]   Discharge Planning Services: CM Consult   Living arrangements for the past 2 months: Apartment                                      Prior Living Arrangements/Services Living arrangements for the past 2 months: Apartment Lives with:: Self Patient language and need for interpreter reviewed:: Yes Do you feel safe going back to the place where you live?: Yes      Need for Family Participation in Patient Care: Yes (Comment) Care giver support system in place?: Yes (comment)   Criminal Activity/Legal Involvement Pertinent to Current Situation/Hospitalization: No - Comment as needed  Activities of Daily Living Home Assistive Devices/Equipment: Built-in shower seat, Grab bars in shower ADL Screening (condition at time of admission) Patient's cognitive ability adequate to safely  complete daily activities?: No Is the patient deaf or have difficulty hearing?: No Does the patient have difficulty seeing, even when wearing glasses/contacts?: No Does the patient have difficulty concentrating, remembering, or making decisions?: Yes Patient able to express need for assistance with ADLs?: Yes Does the patient have difficulty dressing or bathing?: No Independently performs ADLs?: Yes (appropriate for developmental age) Does the patient have difficulty walking or climbing stairs?: No Weakness of Legs: Both Weakness of Arms/Hands: Both  Permission Sought/Granted Permission sought to share information with : Family Supports Permission granted to share information with : Yes, Verbal Permission Granted  Share Information with NAME: Ms Laurance Flatten     Permission granted to share info w Relationship: daughter     Emotional Assessment       Orientation: : Oriented to Self, Oriented to Place Alcohol / Substance Use: Not Applicable Psych Involvement: No (comment)  Admission diagnosis:  Hypernatremia [E87.0] AKI (acute kidney injury) ([place]) [N17.9] Urinary tract infection with hematuria, site unspecified [N39.0, R31.9] Patient Active Problem List   Diagnosis Date Noted  . Pressure injury of skin 02/26/2020  . AKI (acute kidney injury) ([place]) 02/25/2020   PCP:  Wenda Low, MD Pharmacy:   [facility] [place] [address] Phone: [phone] Fax: [phone]     Social Determinants  of Health (SDOH) Interventions    Readmission Risk Interventions No flowsheet data found.  

## 2020-02-28 NOTE — Progress Notes (Signed)
Pt confused, restless, and agitated. Pulling tele monitor off all night. Pulling off mittens and pulling at IV lines. Disoriented X 4. Pt states "I need to go in the kitchen". Seroquel ineffective. Patient also picking at skin and pulling off thin derma dressing and says she's itching. Benadryl given at 0300. Pt up out of bed unsteady , RN and tech having to assist to prevent fall. Pt has no other c/o. RN must redirect patient multiple times. Will continue to monitor.

## 2020-02-28 NOTE — Consult Note (Addendum)
Telepsych Consultation   Reason for Consult:  dementia with delirium, agitation, Seroquel ineffective (somnolence). Referring Physician: Mendel Corning, MD  Location of Patient: [facility] Location of Provider: [facility]  Patient Identification: Katie Jackson MRN:  [id number] Principal Diagnosis: Dementia with behavioral disturbance [facility]) Diagnosis:  Principal Problem:   Dementia with behavioral disturbance ([place]) Active Problems:   AKI (acute kidney injury) ([place])   Pressure injury of skin   Total Time spent with patient: 30 minutes  Subjective:   Katie Jackson is a 74 y.o. female patient admitted to hospital after present with complaints that patient had significant history of dementia, poor PO intake last 2 weeks, worsened confusion, weakness, and reported difficulty swallowing.    HPI:  [name], 74 y.o., female patient seen via tele psych by this provider, consulted with Dr. Mallie Darting; and chart reviewed on 02/28/20.  On evaluation [name] reports she is not sure why she was brought to the hospital, but denies suicidal/self-harm/homicidal ideation, psychosis, and paranoia.  Patient denies prior psychiatric hospitalization or outpatient psychiatric services.  States her primary care physician prescribed her Seroquel and that she has been taking it for "little over a year or more."   Patient states that she has been sleeping without difficulty but not eating well.[name]  "When I eat I eat to much and when try to eat desert I can only eat half cause my stomach is stuffed."    During evaluation Kamaile Bissey is alert/oriented x to self and place.  She is calm/cooperative; and mood is congruent with affect.  She does not appear to be responding to internal/external stimuli or delusional thoughts.  Patient denies suicidal/self-harm/homicidal ideation, psychosis, and paranoia.  Patient was abel to give her correct age, DOB, [place],  [place].  Stated that the month was February and could not give what current year.  Reported that she lived a lone, had 3 children one boy and 2 girls.  Patient answered question appropriately.  If Seroquel is not working can discontinue and continue the Zyprexa 2.5 Q hs.  This patient does not meet criteria for psychiatric hospitalization.  Social work may need to look for placement at skilled nursing facility if patient is no longer able to care for herself at home        Past Psychiatric History: Denies prior psychiatric history  Risk to Self:  NO Risk to Others:  NO Prior Inpatient Therapy:  Denies Prior Outpatient Therapy:  Denies   Past Medical History:  Past Medical History:  Diagnosis Date  . High cholesterol   . Hypertension     Past Surgical History:  Procedure Laterality Date  . BREAST BIOPSY    . cataract Bilateral    Family History: History reviewed. No pertinent family history. Family Psychiatric  History: Unaware Social History:  Social History   Substance and Sexual Activity  Alcohol Use Not Currently     Social History   Substance and Sexual Activity  Drug Use Never    Social History   Socioeconomic History  . Marital status: Single    Spouse name: Not on file  . Number of children: Not on file  . Years of education: Not on file  . Highest education level: Not on file  Occupational History  . Not on file  Tobacco Use  . Smoking status: Former Research scientist (life sciences)  . Smokeless tobacco: Never Used  Substance and Sexual Activity  . Alcohol use: Not  Currently  . Drug use: Never  . Sexual activity: Not on file  Other Topics Concern  . 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 Difficulty of Paying Living Expenses:   Food Insecurity:   . Worried About Paediatric nurse in the Last Year:   . Arboriculturist in the Last Year:   Transportation Needs:   . Film/video editor (Medical):   [name] Lack of Transportation (Non-Medical):   Physical Activity:   . Days of Exercise per Week:   . Minutes of Exercise per Session:   Stress:   . Feeling of Stress :   Social Connections:   . Frequency of Communication with Friends and Family:   . Frequency of Social Gatherings with Friends and Family:   . Attends Religious Services:   . Active Member of Clubs or Organizations:   . Attends Archivist Meetings:   [name] Marital Status:    Additional Social History:    Allergies:   Allergies  Allergen Reactions  . Penicillins Other (See Comments)    From childhood; reaction not recalled- Has patient had a PCN reaction causing immediate rash, facial/tongue/throat swelling, SOB or lightheadedness with hypotension: Unk Has patient had a PCN reaction causing severe rash involving mucus membranes or skin necrosis: Unk Has patient had a PCN reaction that required hospitalization: Unk Has patient had a PCN reaction occurring within the last 10 years: No If all of the above answers are "NO", then may proceed with Cephalosporin use.     Labs:  Results for orders placed or performed during the hospital encounter of 02/25/20 (from the past 48 hour(s))  Basic metabolic panel     Status: Abnormal   Collection Time: 02/26/20  8:54 PM  Result Value Ref Range   Sodium 157 (H) 135 - 145 mmol/L   Potassium 3.4 (L) 3.5 - 5.1 mmol/L   Chloride 121 (H) 98 - 111 mmol/L   CO2 25 22 - 32 mmol/L   Glucose, Bld 131 (H) 70 - 99 mg/dL    Comment: Glucose reference range applies only to samples taken after fasting for at least 8 hours.   BUN 53 (H) 8 - 23 mg/dL   Creatinine, Ser 3.46 (H) 0.44 - 1.00 mg/dL   Calcium 8.4 (L) 8.9 - 10.3 mg/dL   GFR calc non Af Amer 12 (L) >60 mL/min   GFR calc Af Amer 14 (L) >60 mL/min   Anion gap 11 5 - 15    Comment: Performed at  [facility], [place] [address], [place], [place] [id number]  Basic metabolic panel     Status: Abnormal   Collection Time: 02/27/20  5:36 AM  Result Value Ref Range   Sodium 154 (H) 135 - 145 mmol/L   Potassium 3.4 (L) 3.5 - 5.1 mmol/L   Chloride 125 (H) 98 - 111 mmol/L   CO2 23 22 - 32 mmol/L   Glucose, Bld 93 70 - 99 mg/dL    Comment: Glucose reference range applies only to samples taken after fasting for at least 8 hours.   BUN 48 (H) 8 - 23 mg/dL   Creatinine, Ser 3.02 (H) 0.44 - 1.00 mg/dL   Calcium 8.3 (L) 8.9 -  10.3 mg/dL   GFR calc non Af Amer 14 (L) >60 mL/min   GFR calc Af Amer 17 (L) >60 mL/min   Anion gap 6 5 - 15    Comment: Performed at [facility], [place] [address] [place], [place] [id number]  Basic metabolic panel     Status: Abnormal   Collection Time: 02/27/20  1:18 PM  Result Value Ref Range   Sodium 155 (H) 135 - 145 mmol/L   Potassium 3.6 3.5 - 5.1 mmol/L   Chloride 123 (H) 98 - 111 mmol/L   CO2 24 22 - 32 mmol/L   Glucose, Bld 94 70 - 99 mg/dL    Comment: Glucose reference range applies only to samples taken after fasting for at least 8 hours.   BUN 43 (H) 8 - 23 mg/dL   Creatinine, Ser 2.79 (H) 0.44 - 1.00 mg/dL   Calcium 8.3 (L) 8.9 - 10.3 mg/dL   GFR calc non Af Amer 16 (L) >60 mL/min   GFR calc Af Amer 18 (L) >60 mL/min   Anion gap 8 5 - 15    Comment: Performed at [facility], [place] [address] [place], [place] [id number]  Basic metabolic panel     Status: Abnormal   Collection Time: 02/27/20  8:49 PM  Result Value Ref Range   Sodium 152 (H) 135 - 145 mmol/L   Potassium 3.5 3.5 - 5.1 mmol/L   Chloride 121 (H) 98 - 111 mmol/L   CO2 23 22 - 32 mmol/L   Glucose, Bld 103 (H) 70 - 99 mg/dL    Comment: Glucose reference range applies only to samples taken after fasting for at least 8 hours.   BUN 35 (H) 8 - 23 mg/dL   Creatinine, Ser 2.54 (H) 0.44 - 1.00 mg/dL   Calcium 7.9 (L) 8.9 - 10.3 mg/dL   GFR  calc non Af Amer 18 (L) >60 mL/min   GFR calc Af Amer 21 (L) >60 mL/min   Anion gap 8 5 - 15    Comment: Performed at [facility], [place] [address], [place], [place] [id number]  Basic metabolic panel     Status: Abnormal   Collection Time: 02/28/20  7:33 AM  Result Value Ref Range   Sodium 148 (H) 135 - 145 mmol/L   Potassium 3.3 (L) 3.5 - 5.1 mmol/L   Chloride 120 (H) 98 - 111 mmol/L   CO2 21 (L) 22 - 32 mmol/L   Glucose, Bld 90 70 - 99 mg/dL    Comment: Glucose reference range applies only to samples taken after fasting for at least 8 hours.   BUN 28 (H) 8 - 23 mg/dL   Creatinine, Ser 2.38 (H) 0.44 - 1.00 mg/dL   Calcium 7.7 (L) 8.9 - 10.3 mg/dL   GFR calc non Af Amer 19 (L) >60 mL/min   GFR calc Af Amer 22 (L) >60 mL/min   Anion gap 7 5 - 15    Comment: Performed at [facility], [place] [address], [place], [place] [address]    Medications:  Current Facility-Administered Medications  Medication Dose Route Frequency Provider Last Rate Last Admin  . dextrose 5 %-0.45 % sodium chloride infusion   Intravenous Continuous Rai, Ripudeep K, MD 100 mL/hr at 02/28/20 0208 New Bag at 02/28/20 0208  . donepezil (ARICEPT) tablet 10 mg  10 mg Oral QHS Rai, Ripudeep K, MD   10 mg at 02/27/20 2139  . haloperidol  lactate (HALDOL) injection 0.5 mg  0.5 mg Intravenous Q6H PRN Rai, Ripudeep K, MD      . heparin injection 5,000 Units  5,000 Units Subcutaneous Q8H Dana Allan I, MD   5,000 Units at 02/27/20 2140  . OLANZapine (ZYPREXA) tablet 2.5 mg  2.5 mg Oral QHS Rai, Ripudeep K, MD      . potassium chloride SA (KLOR-CON) CR tablet 40 mEq  40 mEq Oral Once Rai, Ripudeep K, MD      . sodium chloride flush (NS) 0.9 % injection 10-40 mL  10-40 mL Intracatheter Q12H Rai, Ripudeep K, MD      . sodium chloride flush (NS) 0.9 % injection 10-40 mL  10-40 mL Intracatheter PRN Rai, Ripudeep K, MD        Musculoskeletal: Strength & Muscle Tone: Unable to assess via  tele psych.  Patient elevated up in bed Gait & Station: Did not see patient ambulate Patient leans: N/A  Psychiatric Specialty Exam: Physical Exam  Vitals reviewed. Constitutional: She is oriented to person, place, and time. No distress.  Respiratory: Effort normal.  Neurological: She is alert and oriented to person, place, and time.    Review of Systems  Psychiatric/Behavioral: Negative for hallucinations, self-injury, sleep disturbance and suicidal ideas. Confusion: Some times. The patient is not nervous/anxious.     Blood pressure (!) 117/58, pulse 77, temperature 99.1 F (37.3 C), temperature source Oral, resp. rate 17, height 5\' 3"  (1.6 m), weight 45.8 kg, SpO2 100 %.Body mass index is 17.89 kg/m.  General Appearance: Casual  Eye Contact:  Good  Speech:  Clear and Coherent and Normal Rate  Volume:  Normal  Mood:  "Fine"  Affect:  Appropriate and Congruent  Thought Process:  Coherent, Goal Directed and Descriptions of Associations: Intact  Orientation:  Full (Time, Place, and Person)  Thought Content:  WDL  Suicidal Thoughts:  No  Homicidal Thoughts:  No  Memory:  Immediate;   Poor Recent;   Poor  Judgement:  Fair  Insight:  Present  Psychomotor Activity:  Decreased  Concentration:  Concentration: Fair and Attention Span: Fair  Recall:  Poor  Fund of Knowledge:  Fair  Language:  Good  Akathisia:  No  Handed:  Right  AIMS (if indicated):     Assets:  Communication Skills Desire for Improvement Housing Social Support  ADL's:  Intact  Cognition:  WNL  Sleep:        Treatment Plan Summary: Plan Psychiatric clear; continue current medication  Disposition:  Patient psychiatrically cleared.  Continue Zyprexa 2.5 mg.    No evidence of imminent risk to self or others at present.   Patient does not meet criteria for psychiatric inpatient admission. Supportive therapy provided about ongoing stressors. Discussed crisis plan, support from social network, calling 911,  coming to the Emergency Department, and calling Suicide Hotline.  This service was provided via telemedicine using a 2-way, interactive audio and video technology.  Names of all persons participating in this telemedicine service and their role in this encounter. Name: Earleen Newport Role: NP  Name: Dr. Mallie Darting Role: Psychiatrist   Name: Francine Graven. [place] Role: Patient  Name:  Role:     Earleen Newport, NP 02/28/2020 4:12 PM

## 2020-02-28 NOTE — Progress Notes (Signed)
PT Cancellation Note  Patient Details Name: Katie Jackson MRN: [id number] DOB: 12-07-1946   Cancelled Treatment:    Reason Eval/Treat Not Completed: Fatigue/lethargy limiting ability to participate. Therapist attempted to engage pt multiple times, responds appropriately to questions, but only able to maintain eyes opened for 3-5 seconds before closing again. Pt reports being "tired" and difficult to remain awake. Will check back later today if time allows.   Talbot Grumbling PT, DPT 02/28/20, 11:04 AM [phone]

## 2020-02-28 NOTE — Evaluation (Signed)
Physical Therapy Evaluation Patient Details Name: Katie Jackson MRN: [id number] DOB: May 28, 1946 Today's Date: 02/28/2020   History of Present Illness  74 year old female admitted with dementia, failure to thrive, HTN, hyperlipidemia, and dysphagia.  Clinical Impression  Pt admitted with above diagnosis. Pt impulsive with all mobility, responds well to verbal cues for safety. Pt strong and with fair balance using RW. Educated pt on importance of slowing down, gait with RW to maintain body positioned within RW frame, and safety with IV line. Will continue to assess gait with RW, SPC and no AD. Pt currently with functional limitations due to the deficits listed below (see PT Problem List). Pt will benefit from skilled PT to increase their independence and safety with mobility to allow discharge to the venue listed below.       Follow Up Recommendations Home health PT;Supervision - Intermittent;Supervision for mobility/OOB    Equipment Recommendations  Rolling walker with 5" wheels;Cane    Recommendations for Other Services       Precautions / Restrictions Precautions Precautions: Fall Precaution Comments: impulsive Restrictions Weight Bearing Restrictions: No      Mobility  Bed Mobility Overal bed mobility: Modified Independent    General bed mobility comments: impulsive, verbal cues for safety with IV line  Transfers Overall transfer level: Needs assistance   Transfers: Sit to/from Stand;Stand Pivot Transfers Sit to Stand: Supervision Stand pivot transfers: Supervision       General transfer comment: pt rises from EOB and bedside chair without physical assistance, but impulsive despite verbal cues and education for IV line safety  Ambulation/Gait Ambulation/Gait assistance: Supervision;Min guard Gait Distance (Feet): 100 Feet Assistive device: Rolling walker (2 wheeled) Gait Pattern/deviations: Step-through pattern Gait velocity: WFL   General Gait Details:  impulsive speed and direction changes, attempts to push RW out of way or ambulates outside AD, follow commands with lots of cues for attention to task and safety  Stairs            Wheelchair Mobility    Modified Rankin (Stroke Patients Only)       Balance Overall balance assessment: Needs assistance Sitting-balance support: Feet supported;No upper extremity supported Sitting balance-Leahy Scale: Good Sitting balance - Comments: seated EOB   Standing balance support: During functional activity;Bilateral upper extremity supported Standing balance-Leahy Scale: Fair Standing balance comment: fair with RW                        Pertinent Vitals/Pain Pain Assessment: No/denies pain    Home Living Family/patient expects to be discharged to:: Private residence Living Arrangements: Alone Available Help at Discharge: Family;Available 24 hours/day(daughter cheks on pt morning/night, granddaughter checks on pt during the day) Type of Home: House Home Access: Stairs to enter Entrance Stairs-Rails: Right;Left;Can reach both Entrance Stairs-Number of Steps: 4 Home Layout: One level Home Equipment: Bedside commode      Prior Function Level of Independence: Independent         Comments: Pt reports independent with ADLs, ambulation and transfers without AD at home. Pt reports family provides driving. Social worker spoke with pt's daughter, reports she checks on pt in morning and night, granddaughter checks on her during the day, and confirms pt lives alone.     Hand Dominance        Extremity/Trunk Assessment   Upper Extremity Assessment Upper Extremity Assessment: Overall WFL for tasks assessed    Lower Extremity Assessment Lower Extremity Assessment: Overall WFL for tasks assessed(BLE grossly  4/5)    Cervical / Trunk Assessment Cervical / Trunk Assessment: Normal  Communication   Communication: No difficulties  Cognition Arousal/Alertness:  Awake/alert Behavior During Therapy: WFL for tasks assessed/performed;Impulsive Overall Cognitive Status: Within Functional Limits for tasks assessed               General Comments      Exercises     Assessment/Plan    PT Assessment Patient needs continued PT services  PT Problem List Decreased activity tolerance;Decreased knowledge of use of DME;Decreased safety awareness;Decreased knowledge of precautions       PT Treatment Interventions DME instruction;Gait training;Stair training;Functional mobility training;Therapeutic activities;Therapeutic exercise;Balance training;Neuromuscular re-education;Patient/family education;Manual techniques    PT Goals (Current goals can be found in the Care Plan section)  Acute Rehab PT Goals Patient Stated Goal: go home PT Goal Formulation: With patient Time For Goal Achievement: 03/06/20 Potential to Achieve Goals: Good    Frequency Min 3X/week   Barriers to discharge        Co-evaluation               AM-PAC PT "6 Clicks" Mobility  Outcome Measure Help needed turning from your back to your side while in a flat bed without using bedrails?: None Help needed moving from lying on your back to sitting on the side of a flat bed without using bedrails?: None Help needed moving to and from a bed to a chair (including a wheelchair)?: A Little Help needed standing up from a chair using your arms (e.g., wheelchair or bedside chair)?: A Little Help needed to walk in hospital room?: A Little Help needed climbing 3-5 steps with a railing? : A Little 6 Click Score: 20    End of Session Equipment Utilized During Treatment: Gait belt Activity Tolerance: Patient tolerated treatment well Patient left: in bed;with call bell/phone within reach;with bed alarm set Nurse Communication: Mobility status PT Visit Diagnosis: Other abnormalities of gait and mobility (R26.89)    Time: [id number] PT Time Calculation (min) (ACUTE ONLY): 14  min   Charges:   PT Evaluation $PT Eval Low Complexity: 1 Low           Tori Brevyn Ring PT, DPT 02/28/20, 3:39 PM [phone]

## 2020-02-28 NOTE — Progress Notes (Addendum)
[facility]                                                                              Patient Demographics  Katie Jackson, is a 74 y.o. female, DOB - Mar 07, 1946, [id number]  Admit date - 02/25/2020   Admitting Physician Katie Public, MD  Outpatient Primary MD for the patient is Katie Low, MD  Outpatient specialists:   LOS - 3  days   Medical records reviewed and are as summarized below:    Chief Complaint  Patient presents with  . Weakness  . Failure To Thrive       Brief summary   Patient is a 74 year old female with history of dementia, hypertension, hyperlipidemia.  Per admission history, patient had poor p.o. intake in the last 2 weeks, more confused, feeling weak and reported difficulty swallowing. Patient was found to have sodium of 161, potassium 3.2, BUN 71, creatinine 5.25, up from 1.07 on 11/05/2017.  UA positive for UTI, ketones.  Assessment & Plan    Principal problem   AKI (acute kidney injury) ([place])  -Unclear baseline, creatinine was 1.07 in 11/05/2017, may have developed CKD in the interim -Renal ultrasound showed no obstruction or hydronephrosis. - Patient presented with creatinine of 5.25 on admission, -Currently improving, creatinine 2.38  Acute hypernatremia with severe dehydration -Sodium 161 on admission, continue D5 half-normal saline -Sodium improving, 148, continue IV fluid hydration  Acute encephalopathy on dementia with agitation -Likely worsened due to acute kidney injury, acute hyponatremia, UTI - Discussed in detail with the patient's daughter on the phone, at baseline, she sleeps during the day and is up at night.  Daughter is aware of patient's dementia and has been progressively declining, had quit eating in the last 2 3 weeks.  She also gets frequent UTIs. -Patient was very sleepy with the Seroquel 25 mg yesterday, hence the dose was decreased to 12.5 mg nightly.  This morning, she is much more alert and  oriented however did not sleep well last night. -Will DC Seroquel, placed on Zyprexa 2.5 mg nightly, requested psych consult for any other recommendations.   -Obtain PT OT evaluation, daughter requested home health aide, RN  Hypokalemia Replaced  UTI -Continue IV Rocephin for 3 days, completed   Reported dysphagia: -SLP evaluation   Failure to thrive, underlying dementia, falls -Palliative medicine consulted for goals of care  Pressure injury Mid sacrum stage I Wound care per nursing   Severe protein calorie malnutrition Estimated body mass index is 17.89 kg/m as calculated from the following:   Height as of this encounter: 5\' 3"  (1.6 m).   Weight as of this encounter: 45.8 kg.  Dietitian consult  Code Status: Full CODE STATUS DVT Prophylaxis: Heparin subcu Family Communication: Discussed in detail, updated imaging, lab results and current status with patient's daughter on the phone  Disposition Plan: Patient from home, lives alone in a senior living. She had quit eating for last 2 weeks which led to AKI, severe dehydration, hypernatremia. Still significant sundowning and agitation, likely worsening dementia, changing medications (Zyprexa) today to assess improvement in mental status, Na still 148, Cr 2.38 requiring IV  fluid hydration, precludes safe discharge to home alone (needs supervision).    Time Spent in minutes   35 minutes  Procedures:  None  Consultants:   Palliative medicine  Antimicrobials:   Anti-infectives (From admission, onward)   Start     Dose/Rate Route Frequency Ordered Stop   02/26/20 0421  cefTRIAXone (ROCEPHIN) 1 g in sodium chloride 0.9 % 100 mL IVPB  Status:  Discontinued     1 g 200 mL/hr over 30 Minutes Intravenous Every 24 hours 02/26/20 0421 02/26/20 0422   02/25/20 1900  cefTRIAXone (ROCEPHIN) 1 g in sodium chloride 0.9 % 100 mL IVPB  Status:  Discontinued     1 g 200 mL/hr over 30 Minutes Intravenous Every 24 hours 02/25/20  1830 02/28/20 0927         Medications  Scheduled Meds: . donepezil  10 mg Oral QHS  . heparin  5,000 Units Subcutaneous Q8H  . OLANZapine  2.5 mg Oral QHS  . potassium chloride  40 mEq Oral Once  . sodium chloride flush  10-40 mL Intracatheter Q12H   Continuous Infusions: . dextrose 5 % and 0.45% NaCl 100 mL/hr at 02/28/20 0208   PRN Meds:.      Subjective:   Katie Jackson was seen and examined today.  Much more alert and oriented today, knows she is in the hospital, oriented to self.  President "Tawni Pummel".  Denies any pain, no nausea vomiting abdominal pain, fevers or chills.  Did not sleep well overnight and was agitated.    Objective:   Vitals:   02/27/20 0545 02/27/20 1806 02/27/20 2126 02/28/20 0536  BP: 127/63 108/60 93/72 (!) 119/57  Pulse: 80 68 72 64  Resp: 18 13 16 20   Temp: 98.3 F (36.8 C) 98.6 F (37 C) 98.6 F (37 C) 98.8 F (37.1 C)  TempSrc: Oral Axillary Oral Oral  SpO2: 98% 99% 100% 99%  Weight:      Height:        Intake/Output Summary (Last 24 hours) at 02/28/2020 1213 Last data filed at 02/27/2020 1403 Gross per 24 hour  Intake 0 ml  Output --  Net 0 ml     Wt Readings from Last 3 Encounters:  02/25/20 45.8 kg  08/02/18 51.7 kg  06/10/18 54.9 kg   Physical Exam  General: Alert and oriented x 2, NAD  Cardiovascular: S1 S2 clear, RRR. No pedal edema b/l  Respiratory: CTAB, no wheezing, rales or rhonchi  Gastrointestinal: Soft, nontender, nondistended, NBS  Ext: no pedal edema bilaterally  Neuro: no new deficits  Musculoskeletal: No cyanosis, clubbing  Skin: Scabs on the extremities from scratching  Psych: Dementia     Data Reviewed:  I have personally reviewed following labs and imaging studies  Micro Results Recent Results (from the past 240 hour(s))  SARS CORONAVIRUS 2 (TAT 6-24 HRS) Nasopharyngeal Nasopharyngeal Swab     Status: None   Collection Time: 02/25/20  5:14 PM   Specimen: Nasopharyngeal Swab  Result  Value Ref Range Status   SARS Coronavirus 2 NEGATIVE NEGATIVE Final    Comment: (NOTE) SARS-CoV-2 target nucleic acids are NOT DETECTED. The SARS-CoV-2 RNA is generally detectable in upper and lower respiratory specimens during the acute phase of infection. Negative results do not preclude SARS-CoV-2 infection, do not rule out co-infections with other pathogens, and should not be used as the sole basis for treatment or other patient management decisions. Negative results must be combined with clinical observations, patient history, and epidemiological information.  The expected result is Negative. Fact Sheet for Patients: SugarRoll.be Fact Sheet for Healthcare Providers: https://www.woods-mathews.com/ This test is not yet approved or cleared by the [place] FDA and  has been authorized for detection and/or diagnosis of SARS-CoV-2 by FDA under an Emergency Use Authorization (EUA). This EUA will remain  in effect (meaning this test can be used) for the duration of the COVID-19 declaration under Section 56 4(b)(1) of the Act, 21 U.S.C. section 360bbb-3(b)(1), unless the authorization is terminated or revoked sooner. Performed at [facility], [place] [address], [place], [place] [address]   Urine culture     Status: Abnormal   Collection Time: 02/25/20  5:14 PM   Specimen: Urine, Clean Catch  Result Value Ref Range Status   Specimen Description   Final    URINE, CLEAN CATCH Performed at [facility], [place] [address] [place], [place] [address]    Special Requests   Final    NONE Performed at [facility], [place] [address], [place],  [address]    Culture MULTIPLE SPECIES PRESENT, SUGGEST RECOLLECTION (A)  Final   Report Status 02/27/2020 FINAL  Final    Radiology Reports DG Chest 2 View  Result Date: 02/25/2020 CLINICAL DATA:  Weakness, dementia EXAM: CHEST - 2 VIEW COMPARISON:  09/18/2008  FINDINGS: Frontal and lateral views of the chest demonstrate an unremarkable cardiac silhouette. Lungs are hyperinflated without airspace disease, effusion, or pneumothorax. No acute bony abnormalities. IMPRESSION: 1. No acute intrathoracic process. 2. Emphysema. Electronically Signed   By: Randa Ngo M.D.   On: 02/25/2020 16:11   US RENAL  Result Date: 02/26/2020 CLINICAL DATA:  Acute kidney injury EXAM: RENAL / URINARY TRACT ULTRASOUND COMPLETE COMPARISON:  None. FINDINGS: Right Kidney: Renal measurements: 7.4 x 3.6 x 3.8 cm = volume: 52 mL. Generalized cortical thinning. No hydronephrosis or mass. Left Kidney: Renal measurements: 7.7 x 3.6 x 4.3 cm = volume: 61 mL. Generalized cortical thinning. No hydronephrosis or mass. Bladder: Appears normal for degree of bladder distention. IMPRESSION: 1. Symmetric renal atrophy. 2. No hydronephrosis or reversible finding. Electronically Signed   By: Monte Fantasia M.D.   On: 02/26/2020 04:59    Lab Data:  CBC: Recent Labs  Lab 02/25/20 1558 02/26/20 0250 02/26/20 1451  WBC 9.3 7.9 7.9  NEUTROABS 6.3  --   --   HGB 13.9 13.2 12.8  HCT 46.2* 43.9 41.9  MCV 94.7 94.4 94.8  PLT 156 138* [id number]*   Basic Metabolic Panel: Recent Labs  Lab 02/26/20 0250 02/26/20 0914 02/26/20 2054 02/27/20 0536 02/27/20 1318 02/27/20 2049 02/28/20 0733  NA 160*   < > 157* 154* 155* 152* 148*  K 3.6   < > 3.4* 3.4* 3.6 3.5 3.3*  CL 123*   < > 121* 125* 123* 121* 120*  CO2 22   < > 25 23 24 23  21*  GLUCOSE 84   < > 131* 93 94 103* 90  BUN 67*   < > 53* 48* 43* 35* 28*  CREATININE 4.48*   < > 3.46* 3.02* 2.79* 2.54* 2.38*  CALCIUM 9.2   < > 8.4* 8.3* 8.3* 7.9* 7.7*  MG 2.8*  --   --   --   --   --   --   PHOS 3.7  --   --   --   --   --   --    < > = values in this interval not displayed.  GFR: Estimated Creatinine Clearance: 14.8 mL/min (A) (by C-G formula based on SCr of 2.38 mg/dL (H)). Liver Function Tests: Recent Labs  Lab 02/25/20 1558  AST 22   ALT 13  ALKPHOS 43  BILITOT 1.5*  PROT 6.4*  ALBUMIN 3.5   No results for input(s): LIPASE, AMYLASE in the last 168 hours. No results for input(s): AMMONIA in the last 168 hours. Coagulation Profile: No results for input(s): INR, PROTIME in the last 168 hours. Cardiac Enzymes: No results for input(s): CKTOTAL, CKMB, CKMBINDEX, TROPONINI in the last 168 hours. BNP (last 3 results) No results for input(s): PROBNP in the last 8760 hours. HbA1C: No results for input(s): HGBA1C in the last 72 hours. CBG: No results for input(s): GLUCAP in the last 168 hours. Lipid Profile: No results for input(s): CHOL, HDL, LDLCALC, TRIG, CHOLHDL, LDLDIRECT in the last 72 hours. Thyroid Function Tests: No results for input(s): TSH, T4TOTAL, FREET4, T3FREE, THYROIDAB in the last 72 hours. Anemia Panel: No results for input(s): VITAMINB12, FOLATE, FERRITIN, TIBC, IRON, RETICCTPCT in the last 72 hours. Urine analysis:    Component Value Date/Time   COLORURINE YELLOW 02/25/2020 1714   APPEARANCEUR TURBID (A) 02/25/2020 1714   LABSPEC 1.016 02/25/2020 1714   PHURINE 8.0 02/25/2020 1714   GLUCOSEU NEGATIVE 02/25/2020 1714   HGBUR NEGATIVE 02/25/2020 1714   BILIRUBINUR NEGATIVE 02/25/2020 1714   KETONESUR 5 (A) 02/25/2020 1714   PROTEINUR >=300 (A) 02/25/2020 1714   NITRITE NEGATIVE 02/25/2020 1714   LEUKOCYTESUR MODERATE (A) 02/25/2020 1714     Aviya Jarvie M.D. [facility] 02/28/2020, 12:13 PM   Call night coverage person covering after 7pm

## 2020-02-28 NOTE — Consult Note (Signed)
Palliative care consult note  Reason for consult: Goals of care in light of dementia with poor nutritional status with hypernatremia and acute kidney injury  Palliative care consult received.  Chart reviewed including personal his pertinent labs and imaging.  Discussed with bedside RN.  Briefly, Katie Jackson is a 74 year old female with past medical history of dementia, hypertension and hyperlipidemia.  She lives alone and was brought to the ED following decreased intake for the last 2 weeks, more confusion, and being weak.  Her daughters also reports concern about difficulty with swallowing.  In the ED, she was found to have sodium that was elevated as well as creatinine of 5.25.  UA was positive for UTI and ketones.  Palliative consulted for goals of care.  I saw and examined Katie Jackson today.  At time of my encounter, she was awake and alert.  Her RN reports that the first time she has been up all day.  She is pleasant and cooperative to examination, however, she is pleasantly confused.  When asked why she was brought to the hospital, she tells me that she needs to hold onto handrails of her house more and chew her food very carefully.    She reports that she has 5 children and her family is the most important thing to her.  She specifically requests that I call her daughter, Katie Jackson.  Katie Jackson reports that Katie Jackson is an independent woman and she ultimately wants her mother to return back to her own home at senior apartment complex.  She and other family check in on her multiple times per day and assist her with her daily needs.  She is open to idea of rehab if recommended by PT, but she views this another step toward returning home rather than needing long term placement.  Katie Jackson reports that family has not had any discussion regarding any limits of care.  Katie Jackson is FULL CODE/FULL SCOPE treatment.  Katie Jackson reports that this is something that family will need to discuss, but her focus at this point is on  doing anything possible to get her mother back to her baseline prior to her decline over the last 2 weeks.    We discussed unknown of her clinical course moving forward and natural trajectory of dementia.  Katie Jackson reports being appreciative of updates.  She works during the day and usually comes to visit in the early evening before visitation hours end.  - Full code/fullscope - Eventual goal of family is for patient to return to her own home.  They are agreeable to consideration for rehab prior if recommended. - Long term, she would benefit from continued palliative follow-up.  Will continue conversations regarding long term advance care planning while in the hospital, but she will benefit from outpatient palliative care following on discharge.  Time in: 1600 Time out: 1700 Total time: 60 minutes  Greater than 50%  of this time was spent counseling and coordinating care related to the above assessment and plan.  Micheline Rough, MD [place] Team [phone]

## 2020-02-29 LAB — BASIC METABOLIC PANEL
Anion gap: 5 (ref 5–15)
BUN: 19 mg/dL (ref 8–23)
CO2: 23 mmol/L (ref 22–32)
Calcium: 7.9 mg/dL — ABNORMAL LOW (ref 8.9–10.3)
Chloride: 120 mmol/L — ABNORMAL HIGH (ref 98–111)
Creatinine, Ser: 1.79 mg/dL — ABNORMAL HIGH (ref 0.44–1.00)
GFR calc Af Amer: 32 mL/min — ABNORMAL LOW (ref >60)
GFR calc non Af Amer: 27 mL/min — ABNORMAL LOW (ref >60)
Glucose, Bld: 100 mg/dL — ABNORMAL HIGH (ref 70–99)
Potassium: 2.8 mmol/L — ABNORMAL LOW (ref 3.5–5.1)
Sodium: 148 mmol/L — ABNORMAL HIGH (ref 135–145)

## 2020-02-29 LAB — CBC
HCT: 30.6 % — ABNORMAL LOW (ref 36.0–46.0)
Hemoglobin: 9.5 g/dL — ABNORMAL LOW (ref 12.0–15.0)
MCH: 29.2 pg (ref 26.0–34.0)
MCHC: 31 g/dL (ref 30.0–36.0)
MCV: 94.2 fL (ref 80.0–100.0)
Platelets: 97 10*3/uL — ABNORMAL LOW (ref 150–400)
RBC: 3.25 MIL/uL — ABNORMAL LOW (ref 3.87–5.11)
RDW: 14.3 % (ref 11.5–15.5)
WBC: 4.7 10*3/uL (ref 4.0–10.5)
nRBC: 0 % (ref 0.0–0.2)

## 2020-02-29 LAB — POTASSIUM: Potassium: 3.7 mmol/L (ref 3.5–5.1)

## 2020-02-29 LAB — MAGNESIUM: Magnesium: 2 mg/dL (ref 1.7–2.4)

## 2020-02-29 MED ORDER — POTASSIUM CHLORIDE CRYS ER 20 MEQ PO TBCR
40.0000 meq | EXTENDED_RELEASE_TABLET | ORAL | Status: AC
  Administered 2020-02-29 (×2): 40 meq via ORAL
  Filled 2020-02-29 (×2): qty 2

## 2020-02-29 MED ORDER — OLANZAPINE 2.5 MG PO TABS
2.5000 mg | ORAL_TABLET | Freq: Every day | ORAL | Status: DC
  Administered 2020-02-29 – 2020-03-02 (×3): 2.5 mg via ORAL
  Filled 2020-02-29 (×4): qty 1

## 2020-02-29 MED ORDER — POTASSIUM CL IN DEXTROSE 5% 20 MEQ/L IV SOLN
20.0000 meq | INTRAVENOUS | Status: DC
  Administered 2020-02-29 – 2020-03-01 (×2): 20 meq via INTRAVENOUS
  Filled 2020-02-29 (×3): qty 1000

## 2020-02-29 NOTE — Progress Notes (Signed)
[facility]                                                                              Patient Demographics  Katie Jackson, is a 74 y.o. female, DOB - 08/15/1946, [id number]  Admit date - 02/25/2020   Admitting Physician Bonnell Public, MD  Outpatient Primary MD for the patient is Wenda Low, MD  Outpatient specialists:   LOS - 4  days   Medical records reviewed and are as summarized below:    Chief Complaint  Patient presents with  . Weakness  . Failure To Thrive       Brief summary   Patient is a 74 year old female with history of dementia, hypertension, hyperlipidemia.  Per admission history, patient had poor p.o. intake in the last 2 weeks, more confused, feeling weak and reported difficulty swallowing. Patient was found to have sodium of 161, potassium 3.2, BUN 71, creatinine 5.25, up from 1.07 on 11/05/2017.  UA positive for UTI, ketones.  Assessment & Plan    Principal problem   AKI (acute kidney injury) ([place])  -Unclear baseline, creatinine was 1.07 in 11/05/2017, may have developed CKD in the interim -Renal ultrasound showed no obstruction or hydronephrosis. - Patient presented with creatinine of 5.25 on admission, -Creatinine currently improving, 1.79 today  Acute hypernatremia with severe dehydration -Sodium 161 on admission, plateaued at 148, -Change IV fluids to D5W with 20 of K -Follow sodium, renal panel  Hypokalemia -Magnesium 2.0, potassium 2.8, -Started on IV replacement and 40 mEq p.o. x1, will recheck potassium now  Acute encephalopathy on dementia with agitation -Likely worsened due to acute kidney injury, acute hyponatremia, UTI -Per daughter, at baseline, she sleeps during the day and is up at night.  Daughter is aware of patient's dementia and has been progressively declining, had quit eating in the last 2 3 weeks.  She also gets frequent UTIs. -Patient was hypersomnolent with Seroquel, hence was discontinued,  started on Zyprexa 2.5 mg qhs, again somnolent today in am.   -Discussed with RN, change Zyprexa 2.5 mg at 1900 daily rather than bed time.  -PT evaluation recommended home health PT, rolling walker, cane  daughter requested home health aide, RN   UTI -Continue IV Rocephin for 3 days, completed   Reported dysphagia: -SLP evaluation   Failure to thrive, underlying dementia, falls -Palliative medicine consulted for goals of care.  Full scope of treatment  Pressure injury Mid sacrum stage I Wound care per nursing   Severe protein calorie malnutrition Estimated body mass index is 17.89 kg/m as calculated from the following:   Height as of this encounter: 5\' 3"  (1.6 m).   Weight as of this encounter: 45.8 kg.  Dietitian consult  Code Status: Full CODE STATUS DVT Prophylaxis: Heparin subcu Family Communication: Discussed in detail, updated imaging, lab results and current status with patient's daughter on the phone  Disposition Plan: Patient from home, lives alone in a senior living. She had quit eating for last 2 weeks which led to AKI, severe dehydration, hypernatremia.  Sodium still elevated, on IV fluids, somnolent with Zyprexa precludes safe discharge to home alone (needs supervision).  Time Spent in minutes   35 minutes  Procedures:  None  Consultants:   Palliative medicine  Antimicrobials:   Anti-infectives (From admission, onward)   Start     Dose/Rate Route Frequency Ordered Stop   02/26/20 0421  cefTRIAXone (ROCEPHIN) 1 g in sodium chloride 0.9 % 100 mL IVPB  Status:  Discontinued     1 g 200 mL/hr over 30 Minutes Intravenous Every 24 hours 02/26/20 0421 02/26/20 0422   02/25/20 1900  cefTRIAXone (ROCEPHIN) 1 g in sodium chloride 0.9 % 100 mL IVPB  Status:  Discontinued     1 g 200 mL/hr over 30 Minutes Intravenous Every 24 hours 02/25/20 1830 02/28/20 0927         Medications  Scheduled Meds: . donepezil  10 mg Oral QHS  . heparin  5,000  Units Subcutaneous Q8H  . OLANZapine  2.5 mg Oral QHS  . potassium chloride  40 mEq Oral Q4H  . sodium chloride flush  10-40 mL Intracatheter Q12H   Continuous Infusions: . dextrose 5 % with KCl 20 mEq / L 20 mEq (02/29/20 0657)   PRN Meds:.      Subjective:   Katie Jackson was seen and examined today.  Again somnolent this morning.  Per RN, no agitation or acute issues overnight.  Unable to obtain review of system from the patient.  Objective:   Vitals:   02/28/20 0536 02/28/20 1500 02/28/20 2013 02/29/20 0604  BP: (!) 119/57 (!) 117/58 130/65 115/63  Pulse: 64 77 73 70  Resp: 20 17 17    Temp: 98.8 F (37.1 C) 99.1 F (37.3 C) 98.4 F (36.9 C) 98.3 F (36.8 C)  TempSrc: Oral Oral Oral   SpO2: 99% 100% 99% 97%  Weight:      Height:        Intake/Output Summary (Last 24 hours) at 02/29/2020 1302 Last data filed at 02/29/2020 0603 Gross per 24 hour  Intake 1443.47 ml  Output 125 ml  Net 1318.47 ml     Wt Readings from Last 3 Encounters:  02/25/20 45.8 kg  08/02/18 51.7 kg  06/10/18 54.9 kg   Physical Exam  General: Somnolent but arousable  Cardiovascular: S1 S2 clear, RRR. No pedal edema b/l  Respiratory: CTAB, no wheezing, rales or rhonchi  Gastrointestinal: Soft, nontender, nondistended, NBS  Ext: no pedal edema bilaterally  Neuro: Does not follow commands  Musculoskeletal: No cyanosis, clubbing  Skin: No rashes  Psych: Somnolent but arousable, dementia     Data Reviewed:  I have personally reviewed following labs and imaging studies  Micro Results Recent Results (from the past 240 hour(s))  SARS CORONAVIRUS 2 (TAT 6-24 HRS) Nasopharyngeal Nasopharyngeal Swab     Status: None   Collection Time: 02/25/20  5:14 PM   Specimen: Nasopharyngeal Swab  Result Value Ref Range Status   SARS Coronavirus 2 NEGATIVE NEGATIVE Final    Comment: (NOTE) SARS-CoV-2 target nucleic acids are NOT DETECTED. The SARS-CoV-2 RNA is generally detectable in upper  and lower respiratory specimens during the acute phase of infection. Negative results do not preclude SARS-CoV-2 infection, do not rule out co-infections with other pathogens, and should not be used as the sole basis for treatment or other patient management decisions. Negative results must be combined with clinical observations, patient history, and epidemiological information. The expected result is Negative. Fact Sheet for Patients: SugarRoll.be Fact Sheet for Healthcare Providers: https://www.woods-mathews.com/ This test is not yet approved or cleared by the [place] FDA and  has been authorized for detection and/or diagnosis of SARS-CoV-2 by FDA under an Emergency Use Authorization (EUA). This EUA will remain  in effect (meaning this test can be used) for the duration of the COVID-19 declaration under Section 56 4(b)(1) of the Act, 21 U.S.C. section 360bbb-3(b)(1), unless the authorization is terminated or revoked sooner. Performed at [facility], [place] [address] [place], [place] [address]   Urine culture     Status: Abnormal   Collection Time: 02/25/20  5:14 PM   Specimen: Urine, Clean Catch  Result Value Ref Range Status   Specimen Description   Final    URINE, CLEAN CATCH Performed at [facility], [place] [address], [place], [place] [address]    Special Requests   Final    NONE Performed at [facility], [place] [address] [place], [place] [address]    Culture MULTIPLE SPECIES PRESENT, SUGGEST RECOLLECTION (A)  Final   Report Status 02/27/2020 FINAL  Final    Radiology Reports DG Chest 2 View  Result Date: 02/25/2020 CLINICAL DATA:  Weakness, dementia EXAM: CHEST - 2 VIEW COMPARISON:  09/18/2008 FINDINGS: Frontal and lateral views of the chest demonstrate an unremarkable cardiac silhouette. Lungs are hyperinflated without airspace disease, effusion, or pneumothorax. No acute bony  abnormalities. IMPRESSION: 1. No acute intrathoracic process. 2. Emphysema. Electronically Signed   By: Randa Ngo M.D.   On: 02/25/2020 16:11   US RENAL  Result Date: 02/26/2020 CLINICAL DATA:  Acute kidney injury EXAM: RENAL / URINARY TRACT ULTRASOUND COMPLETE COMPARISON:  None. FINDINGS: Right Kidney: Renal measurements: 7.4 x 3.6 x 3.8 cm = volume: 52 mL. Generalized cortical thinning. No hydronephrosis or mass. Left Kidney: Renal measurements: 7.7 x 3.6 x 4.3 cm = volume: 61 mL. Generalized cortical thinning. No hydronephrosis or mass. Bladder: Appears normal for degree of bladder distention. IMPRESSION: 1. Symmetric renal atrophy. 2. No hydronephrosis or reversible finding. Electronically Signed   By: Monte Fantasia M.D.   On: 02/26/2020 04:59    Lab Data:  CBC: Recent Labs  Lab 02/25/20 1558 02/26/20 0250 02/26/20 1451 02/29/20 0338  WBC 9.3 7.9 7.9 4.7  NEUTROABS 6.3  --   --   --   HGB 13.9 13.2 12.8 9.5*  HCT 46.2* 43.9 41.9 30.6*  MCV 94.7 94.4 94.8 94.2  PLT 156 138* 136* 97*   Basic Metabolic Panel: Recent Labs  Lab 02/26/20 0250 02/26/20 0914 02/27/20 0536 02/27/20 1318 02/27/20 2049 02/28/20 0733 02/29/20 0338  NA 160*   < > 154* 155* 152* 148* 148*  K 3.6   < > 3.4* 3.6 3.5 3.3* 2.8*  CL 123*   < > 125* 123* 121* 120* 120*  CO2 22   < > 23 24 23  21* 23  GLUCOSE 84   < > 93 94 103* 90 100*  BUN 67*   < > 48* 43* 35* 28* 19  CREATININE 4.48*   < > 3.02* 2.79* 2.54* 2.38* 1.79*  CALCIUM 9.2   < > 8.3* 8.3* 7.9* 7.7* 7.9*  MG 2.8*  --   --   --   --   --  2.0  PHOS 3.7  --   --   --   --   --   --    < > = values in this interval not displayed.   GFR: Estimated Creatinine Clearance: 19.6 mL/min (A) (by C-G formula based on SCr of 1.79 mg/dL (H)). Liver Function Tests: Recent Labs  Lab 02/25/20 1558  AST 22  ALT 13  ALKPHOS 43  BILITOT 1.5*  PROT 6.4*  ALBUMIN 3.5   No results for input(s): LIPASE, AMYLASE in the last 168 hours. No results  for input(s): AMMONIA in the last 168 hours. Coagulation Profile: No results for input(s): INR, PROTIME in the last 168 hours. Cardiac Enzymes: No results for input(s): CKTOTAL, CKMB, CKMBINDEX, TROPONINI in the last 168 hours. BNP (last 3 results) No results for input(s): PROBNP in the last 8760 hours. HbA1C: No results for input(s): HGBA1C in the last 72 hours. CBG: No results for input(s): GLUCAP in the last 168 hours. Lipid Profile: No results for input(s): CHOL, HDL, LDLCALC, TRIG, CHOLHDL, LDLDIRECT in the last 72 hours. Thyroid Function Tests: No results for input(s): TSH, T4TOTAL, FREET4, T3FREE, THYROIDAB in the last 72 hours. Anemia Panel: No results for input(s): VITAMINB12, FOLATE, FERRITIN, TIBC, IRON, RETICCTPCT in the last 72 hours. Urine analysis:    Component Value Date/Time   COLORURINE YELLOW 02/25/2020 1714   APPEARANCEUR TURBID (A) 02/25/2020 1714   LABSPEC 1.016 02/25/2020 1714   PHURINE 8.0 02/25/2020 1714   GLUCOSEU NEGATIVE 02/25/2020 1714   HGBUR NEGATIVE 02/25/2020 1714   BILIRUBINUR NEGATIVE 02/25/2020 1714   KETONESUR 5 (A) 02/25/2020 1714   PROTEINUR >=300 (A) 02/25/2020 1714   NITRITE NEGATIVE 02/25/2020 1714   LEUKOCYTESUR MODERATE (A) 02/25/2020 1714     Louine Tenpenny M.D. [facility] 02/29/2020, 1:02 PM   Call night coverage person covering after 7pm

## 2020-02-29 NOTE — Progress Notes (Signed)
Palliative Care Progress Note  Brief check in with patient and daughter this evening.  Katie Jackson was awake and alert and talking to her family via Facetime.  Daughter invested in helping mother talk to family and denied questions for me this evening.  Katie Rough, MD [place] [facility] Team [phone]  NO CHARGE NOTE

## 2020-03-01 DIAGNOSIS — F0151 Vascular dementia with behavioral disturbance: Secondary | ICD-10-CM

## 2020-03-01 LAB — CBC
HCT: 31.8 % — ABNORMAL LOW (ref 36.0–46.0)
Hemoglobin: 9.7 g/dL — ABNORMAL LOW (ref 12.0–15.0)
MCH: 28.4 pg (ref 26.0–34.0)
MCHC: 30.5 g/dL (ref 30.0–36.0)
MCV: 93.3 fL (ref 80.0–100.0)
Platelets: 101 10*3/uL — ABNORMAL LOW (ref 150–400)
RBC: 3.41 MIL/uL — ABNORMAL LOW (ref 3.87–5.11)
RDW: 14.5 % (ref 11.5–15.5)
WBC: 4.9 10*3/uL (ref 4.0–10.5)
nRBC: 0 % (ref 0.0–0.2)

## 2020-03-01 LAB — BASIC METABOLIC PANEL
Anion gap: 5 (ref 5–15)
BUN: 12 mg/dL (ref 8–23)
CO2: 23 mmol/L (ref 22–32)
Calcium: 8 mg/dL — ABNORMAL LOW (ref 8.9–10.3)
Chloride: 116 mmol/L — ABNORMAL HIGH (ref 98–111)
Creatinine, Ser: 1.73 mg/dL — ABNORMAL HIGH (ref 0.44–1.00)
GFR calc Af Amer: 33 mL/min — ABNORMAL LOW (ref >60)
GFR calc non Af Amer: 28 mL/min — ABNORMAL LOW (ref >60)
Glucose, Bld: 100 mg/dL — ABNORMAL HIGH (ref 70–99)
Potassium: 4.2 mmol/L (ref 3.5–5.1)
Sodium: 144 mmol/L (ref 135–145)

## 2020-03-01 MED ORDER — POTASSIUM CL IN DEXTROSE 5% 20 MEQ/L IV SOLN
20.0000 meq | INTRAVENOUS | Status: DC
  Filled 2020-03-01: qty 1000

## 2020-03-01 MED ORDER — DEXTROSE-NACL 5-0.45 % IV SOLN: INTRAVENOUS | Status: AC

## 2020-03-01 NOTE — Progress Notes (Signed)
Palliative Care Progress Note  Reason for consult: Goals of care in light of dementia with poor nutritional status with hypernatremia and acute kidney injury  I saw and examined Katie Jackson today.  She is awake and alert and participates in conversation.  She still does not have good insight into her overall condition.  Currently reports that she is hungry and was wanting to know when lunch would be served.  No family present at the bedside.  Discussed what will be her wishes moving forward, and she reports that she is hopeful to be able to get back to her own home.  - Full code/fullscope - Eventual goal of family is for patient to return to her own home.   -Her mental status appears to be better today after transition of Zyprexa to 7 PM.  Would recommend continuation of this regimen. - Long term, she would benefit from continued palliative follow-up.    Recommend palliative care to follow as an outpatient when she discharges.  If she is followed by [facility], she would be a candidate for care connections program through [facility].  Total time: 15 minutes Greater than 50%  of this time was spent counseling and coordinating care related to the above assessment and plan.  Micheline Rough, MD [place] Team [phone]

## 2020-03-01 NOTE — Progress Notes (Signed)
[facility]                                                                              Patient Demographics  Katie Jackson, is a 74 y.o. female, DOB - 12-Aug-1946, [id number]  Admit date - 02/25/2020   Admitting Physician Bonnell Public, MD  Outpatient Primary MD for the patient is Wenda Low, MD  Outpatient specialists:   LOS - 5  days   Medical records reviewed and are as summarized below:    Chief Complaint  Patient presents with  . Weakness  . Failure To Thrive       Brief summary   Patient is a 74 year old female with history of dementia, hypertension, hyperlipidemia.  Per admission history, patient had poor p.o. intake in the last 2 weeks, more confused, feeling weak and reported difficulty swallowing. Patient was found to have sodium of 161, potassium 3.2, BUN 71, creatinine 5.25, up from 1.07 on 11/05/2017.  UA positive for UTI, ketones.  Assessment & Plan    Principal problem   AKI (acute kidney injury) ([place])  -Unclear baseline, creatinine was 1.07 in 11/05/2017, may have developed CKD in the interim.  Patient presented with a creatinine of 5.25 on admission. -Renal ultrasound showed no obstruction or hydronephrosis. -Creatinine improving, 1.73 today  Acute hypernatremia with severe dehydration -Sodium 161 on admission, plateaued at 148, -Sodium improving, 144 today  Hypokalemia -Resolved  Acute encephalopathy on dementia with agitation -Likely worsened due to acute kidney injury, acute hyponatremia, UTI -Per daughter, at baseline, she sleeps during the day and is up at night.  Daughter is aware of patient's dementia and has been progressively declining, had quit eating in the last 2 3 weeks.  She also gets frequent UTIs. -Patient was hypersomnolent with Seroquel, hence was discontinued.  She was started on Zyprexa 2.5 mg qhs, was somnolent next morning. -Per RN, patient slept very well with Zyprexa, timings changed to 7 PM daily  rather than at bedtime, much more alert and oriented today  UTI -Continue IV Rocephin for 3 days, completed   Reported dysphagia: SLP evaluation completed on 3/10, continue regular diet with thin liquids   Failure to thrive, underlying dementia, falls -Palliative medicine consulted for goals of care.   - Patient's daughter requested full scope of treatment, not sure if she is realistic of patient's underlying dementia and its progression.  She will discuss with the rest of the family regarding advance care planning, will benefit from outpatient palliative care.  Pressure injury Mid sacrum stage I Wound care per nursing   Severe protein calorie malnutrition Estimated body mass index is 17.89 kg/m as calculated from the following:   Height as of this encounter: 5\' 3"  (1.6 m).   Weight as of this encounter: 45.8 kg.  Dietitian consult  Code Status: Full CODE STATUS DVT Prophylaxis: Heparin subcu Family Communication: Discussed in detail, updated imaging, lab results and current status with patient's daughter on the phone on 3/13.  Called patient's daughter today, unable to make contact.  Disposition Plan: Patient from home, lives alone in a senior living. She had quit eating for last 2 weeks  which led to AKI, severe dehydration, hypernatremia.  Na, Cr improving, on IV fluids, zyprexa at 7pm working better than at the bedtime.  Hopefully, DC home with home health PT OT, RN, home health aide, SW. Patient needs supervision and assistance with ADLs.   Time Spent in minutes 25 minutes  Procedures:  None  Consultants:   Palliative medicine  Antimicrobials:   Anti-infectives (From admission, onward)   Start     Dose/Rate Route Frequency Ordered Stop   02/26/20 0421  cefTRIAXone (ROCEPHIN) 1 g in sodium chloride 0.9 % 100 mL IVPB  Status:  Discontinued     1 g 200 mL/hr over 30 Minutes Intravenous Every 24 hours 02/26/20 0421 02/26/20 0422   02/25/20 1900  cefTRIAXone  (ROCEPHIN) 1 g in sodium chloride 0.9 % 100 mL IVPB  Status:  Discontinued     1 g 200 mL/hr over 30 Minutes Intravenous Every 24 hours 02/25/20 1830 02/28/20 0927         Medications  Scheduled Meds: . donepezil  10 mg Oral QHS  . heparin  5,000 Units Subcutaneous Q8H  . OLANZapine  2.5 mg Oral QHS  . sodium chloride flush  10-40 mL Intracatheter Q12H   Continuous Infusions: . dextrose 5 % with KCl 20 mEq / L 20 mEq (03/01/20 0705)   PRN Meds:.      Subjective:   Katie Jackson was seen and examined today.  Much more alert and awake today.  Per RN Zyprexa at 7 PM worked better and patient slept good last night.  No acute issues this morning.  No fevers or chills.  No abdominal pain, shortness of breath.  Objective:   Vitals:   02/29/20 1347 02/29/20 2106 03/01/20 0517 03/01/20 1346  BP: (!) 133/53 (!) 106/59 135/67 (!) 101/48  Pulse: 63 99 87 70  Resp: 14 16 16 14   Temp: 98.5 F (36.9 C) 98.4 F (36.9 C) 98.2 F (36.8 C) 98.1 F (36.7 C)  TempSrc: Oral Oral Oral   SpO2: 100% 100% 98% 99%  Weight:      Height:        Intake/Output Summary (Last 24 hours) at 03/01/2020 1359 Last data filed at 03/01/2020 0830 Gross per 24 hour  Intake 1545 ml  Output --  Net 1545 ml     Wt Readings from Last 3 Encounters:  02/25/20 45.8 kg  08/02/18 51.7 kg  06/10/18 54.9 kg   Physical Exam  General: Alert and awake, oriented to self, NAD  Cardiovascular: S1 S2 clear, RRR. No pedal edema b/l  Respiratory: CTAB, no wheezing, rales or rhonchi  Gastrointestinal: Soft, nontender, nondistended, NBS  Ext: no pedal edema bilaterally  Neuro: Moving all 4 extremities  Musculoskeletal: No cyanosis, clubbing  Skin: No rashes  Psych: Alert and awake, oriented to self    Data Reviewed:  I have personally reviewed following labs and imaging studies  Micro Results Recent Results (from the past 240 hour(s))  SARS CORONAVIRUS 2 (TAT 6-24 HRS) Nasopharyngeal  Nasopharyngeal Swab     Status: None   Collection Time: 02/25/20  5:14 PM   Specimen: Nasopharyngeal Swab  Result Value Ref Range Status   SARS Coronavirus 2 NEGATIVE NEGATIVE Final    Comment: (NOTE) SARS-CoV-2 target nucleic acids are NOT DETECTED. The SARS-CoV-2 RNA is generally detectable in upper and lower respiratory specimens during the acute phase of infection. Negative results do not preclude SARS-CoV-2 infection, do not rule out co-infections with other pathogens, and should not  be used as the sole basis for treatment or other patient management decisions. Negative results must be combined with clinical observations, patient history, and epidemiological information. The expected result is Negative. Fact Sheet for Patients: SugarRoll.be Fact Sheet for Healthcare Providers: https://www.woods-mathews.com/ This test is not yet approved or cleared by the [place] FDA and  has been authorized for detection and/or diagnosis of SARS-CoV-2 by FDA under an Emergency Use Authorization (EUA). This EUA will remain  in effect (meaning this test can be used) for the duration of the COVID-19 declaration under Section 56 4(b)(1) of the Act, 21 U.S.C. section 360bbb-3(b)(1), unless the authorization is terminated or revoked sooner. Performed at [facility], [place] [address] [place], [place] [address]   Urine culture     Status: Abnormal   Collection Time: 02/25/20  5:14 PM   Specimen: Urine, Clean Catch  Result Value Ref Range Status   Specimen Description   Final    URINE, CLEAN CATCH Performed at [facility], [place] [address], [place], [place] [address]    Special Requests   Final    NONE Performed at [facility], [place] [address] [place], [place] [address]    Culture MULTIPLE SPECIES PRESENT, SUGGEST RECOLLECTION (A)  Final   Report Status 02/27/2020 FINAL  Final    Radiology Reports DG  Chest 2 View  Result Date: 02/25/2020 CLINICAL DATA:  Weakness, dementia EXAM: CHEST - 2 VIEW COMPARISON:  09/18/2008 FINDINGS: Frontal and lateral views of the chest demonstrate an unremarkable cardiac silhouette. Lungs are hyperinflated without airspace disease, effusion, or pneumothorax. No acute bony abnormalities. IMPRESSION: 1. No acute intrathoracic process. 2. Emphysema. Electronically Signed   By: Randa Ngo M.D.   On: 02/25/2020 16:11   US RENAL  Result Date: 02/26/2020 CLINICAL DATA:  Acute kidney injury EXAM: RENAL / URINARY TRACT ULTRASOUND COMPLETE COMPARISON:  None. FINDINGS: Right Kidney: Renal measurements: 7.4 x 3.6 x 3.8 cm = volume: 52 mL. Generalized cortical thinning. No hydronephrosis or mass. Left Kidney: Renal measurements: 7.7 x 3.6 x 4.3 cm = volume: 61 mL. Generalized cortical thinning. No hydronephrosis or mass. Bladder: Appears normal for degree of bladder distention. IMPRESSION: 1. Symmetric renal atrophy. 2. No hydronephrosis or reversible finding. Electronically Signed   By: Monte Fantasia M.D.   On: 02/26/2020 04:59    Lab Data:  CBC: Recent Labs  Lab 02/25/20 1558 02/26/20 0250 02/26/20 1451 02/29/20 0338 03/01/20 0555  WBC 9.3 7.9 7.9 4.7 4.9  NEUTROABS 6.3  --   --   --   --   HGB 13.9 13.2 12.8 9.5* 9.7*  HCT 46.2* 43.9 41.9 30.6* 31.8*  MCV 94.7 94.4 94.8 94.2 93.3  PLT 156 138* 136* 97* [id number]*   Basic Metabolic Panel: Recent Labs  Lab 02/26/20 0250 02/26/20 0914 02/27/20 1318 02/27/20 1318 02/27/20 2049 02/28/20 0733 02/29/20 0338 02/29/20 1324 03/01/20 0555  NA 160*   < > 155*  --  152* 148* 148*  --  144  K 3.6   < > 3.6   < > 3.5 3.3* 2.8* 3.7 4.2  CL 123*   < > 123*  --  121* 120* 120*  --  116*  CO2 22   < > 24  --  23 21* 23  --  23  GLUCOSE 84   < > 94  --  103* 90 100*  --  100*  BUN 67*   < > 43*  --  35* 28* 19  --  12  CREATININE 4.48*   < > 2.79*  --  2.54* 2.38* 1.79*  --  1.73*  CALCIUM 9.2   < > 8.3*  --  7.9* 7.7*  7.9*  --  8.0*  MG 2.8*  --   --   --   --   --  2.0  --   --   PHOS 3.7  --   --   --   --   --   --   --   --    < > = values in this interval not displayed.   GFR: Estimated Creatinine Clearance: 20.3 mL/min (A) (by C-G formula based on SCr of 1.73 mg/dL (H)). Liver Function Tests: Recent Labs  Lab 02/25/20 1558  AST 22  ALT 13  ALKPHOS 43  BILITOT 1.5*  PROT 6.4*  ALBUMIN 3.5   No results for input(s): LIPASE, AMYLASE in the last 168 hours. No results for input(s): AMMONIA in the last 168 hours. Coagulation Profile: No results for input(s): INR, PROTIME in the last 168 hours. Cardiac Enzymes: No results for input(s): CKTOTAL, CKMB, CKMBINDEX, TROPONINI in the last 168 hours. BNP (last 3 results) No results for input(s): PROBNP in the last 8760 hours. HbA1C: No results for input(s): HGBA1C in the last 72 hours. CBG: No results for input(s): GLUCAP in the last 168 hours. Lipid Profile: No results for input(s): CHOL, HDL, LDLCALC, TRIG, CHOLHDL, LDLDIRECT in the last 72 hours. Thyroid Function Tests: No results for input(s): TSH, T4TOTAL, FREET4, T3FREE, THYROIDAB in the last 72 hours. Anemia Panel: No results for input(s): VITAMINB12, FOLATE, FERRITIN, TIBC, IRON, RETICCTPCT in the last 72 hours. Urine analysis:    Component Value Date/Time   COLORURINE YELLOW 02/25/2020 1714   APPEARANCEUR TURBID (A) 02/25/2020 1714   LABSPEC 1.016 02/25/2020 1714   PHURINE 8.0 02/25/2020 1714   GLUCOSEU NEGATIVE 02/25/2020 1714   HGBUR NEGATIVE 02/25/2020 1714   BILIRUBINUR NEGATIVE 02/25/2020 1714   KETONESUR 5 (A) 02/25/2020 1714   PROTEINUR >=300 (A) 02/25/2020 1714   NITRITE NEGATIVE 02/25/2020 1714   LEUKOCYTESUR MODERATE (A) 02/25/2020 1714     Enrigue Hashimi M.D. [facility] 03/01/2020, 1:59 PM   Call night coverage person covering after 7pm

## 2020-03-02 LAB — BASIC METABOLIC PANEL
Anion gap: 5 (ref 5–15)
BUN: 11 mg/dL (ref 8–23)
CO2: 21 mmol/L — ABNORMAL LOW (ref 22–32)
Calcium: 8.1 mg/dL — ABNORMAL LOW (ref 8.9–10.3)
Chloride: 111 mmol/L (ref 98–111)
Creatinine, Ser: 1.49 mg/dL — ABNORMAL HIGH (ref 0.44–1.00)
GFR calc Af Amer: 39 mL/min — ABNORMAL LOW (ref >60)
GFR calc non Af Amer: 34 mL/min — ABNORMAL LOW (ref >60)
Glucose, Bld: 106 mg/dL — ABNORMAL HIGH (ref 70–99)
Potassium: 3.8 mmol/L (ref 3.5–5.1)
Sodium: 137 mmol/L (ref 135–145)

## 2020-03-02 LAB — CBC
HCT: 31.7 % — ABNORMAL LOW (ref 36.0–46.0)
Hemoglobin: 9.9 g/dL — ABNORMAL LOW (ref 12.0–15.0)
MCH: 28.4 pg (ref 26.0–34.0)
MCHC: 31.2 g/dL (ref 30.0–36.0)
MCV: 90.8 fL (ref 80.0–100.0)
Platelets: 113 10*3/uL — ABNORMAL LOW (ref 150–400)
RBC: 3.49 MIL/uL — ABNORMAL LOW (ref 3.87–5.11)
RDW: 14.3 % (ref 11.5–15.5)
WBC: 5.6 10*3/uL (ref 4.0–10.5)
nRBC: 0 % (ref 0.0–0.2)

## 2020-03-02 MED ORDER — HYDRALAZINE HCL 20 MG/ML IJ SOLN
10.0000 mg | Freq: Four times a day (QID) | INTRAMUSCULAR | Status: DC | PRN
  Administered 2020-03-02: 10 mg via INTRAVENOUS
  Filled 2020-03-02: qty 1

## 2020-03-02 MED ORDER — AMLODIPINE BESYLATE 10 MG PO TABS
10.0000 mg | ORAL_TABLET | Freq: Every day | ORAL | Status: DC
  Administered 2020-03-02 – 2020-03-03 (×2): 10 mg via ORAL
  Filled 2020-03-02 (×2): qty 1

## 2020-03-02 NOTE — Care Management Important Message (Signed)
Important Message  Patient Details IM Letter given to Nancy Marus RN Case Manager to present to the Patient Name: Katie Jackson MRN: [id number] Date of Birth: 1946-04-26   Medicare Important Message Given:  Yes     Kerin Salen 03/02/2020, 11:30 AM

## 2020-03-02 NOTE — Progress Notes (Addendum)
[facility]                                                                              Patient Demographics  Katie Jackson, is a 74 y.o. female, DOB - 11/25/1946, [id number]  Admit date - 02/25/2020   Admitting Physician Bonnell Public, MD  Outpatient Primary MD for the patient is Wenda Low, MD  Outpatient specialists:   LOS - 6  days   Medical records reviewed and are as summarized below:    Chief Complaint  Patient presents with  . Weakness  . Failure To Thrive       Brief summary   Patient is a 74 year old female with history of dementia, hypertension, hyperlipidemia.  Per admission history, patient had poor p.o. intake in the last 2 weeks, more confused, feeling weak and reported difficulty swallowing. Patient was found to have sodium of 161, potassium 3.2, BUN 71, creatinine 5.25, up from 1.07 on 11/05/2017.  UA positive for UTI, ketones.  Assessment & Plan    Principal problem   AKI (acute kidney injury) ([place])  -Unclear baseline, creatinine was 1.07 in 11/05/2017, may have developed CKD in the interim.  Patient presented with a creatinine of 5.25 on admission. -Renal ultrasound showed no obstruction or hydronephrosis. -Creatinine improving, 1.49 today, discontinued IV fluids, encourage p.o. diet.  Reassess metabolic panel off the fluids and if patient is eating better.  Acute hypernatremia with severe dehydration -Sodium 161 on admission, plateaued at 148, -Sodium improved, 137, DC IV fluids  Hypokalemia -Resolved  Acute encephalopathy on dementia with agitation -Likely worsened due to acute kidney injury, acute hyponatremia, UTI -Per daughter, at baseline, she sleeps during the day and is up at night.  Daughter is aware of patient's dementia and has been progressively declining, had quit eating in the last 2 3 weeks.  She also gets frequent UTIs. -Patient was hypersomnolent with Seroquel, hence was discontinued.  She was started on  Zyprexa 2.5 mg qhs, was somnolent next morning. -Patient has been sleeping better with Zyprexa timing change to 7 PM daily rather than bedtime.   UTI -Continue IV Rocephin for 3 days, completed antibiotics course   Reported dysphagia: SLP evaluation completed on 3/10, continue regular diet with thin liquids   Failure to thrive, underlying dementia, falls -Palliative medicine consulted for goals of care.   - Patient's daughter requested full scope of treatment, not sure if she is realistic of patient's underlying dementia and its progression.  She will discuss with the rest of the family regarding advance care planning, will benefit from outpatient palliative care.  Essential HTN: BP elevated. Placed on norvasc 10mg  daily, hydralazine IV PRN   Pressure injury Mid sacrum stage I Wound care per nursing   Severe protein calorie malnutrition Estimated body mass index is 17.89 kg/m as calculated from the following:   Height as of this encounter: 5\' 3"  (1.6 m).   Weight as of this encounter: 45.8 kg.  Dietitian consult  Code Status: Full CODE STATUS DVT Prophylaxis: Heparin subcu Family Communication: Discussed in detail, updated imaging, lab results and current status with patient's daughter  Disposition Plan: Patient  from home, lives alone in a senior living. She had quit eating for last 2 weeks which led to AKI, severe dehydration, hypernatremia.  Na, Cr improving, on IV fluids, zyprexa at 7pm working better than at the bedtime.  Hopefully, DC home with home health PT OT, RN, home health aide, SW. Patient needs supervision and assistance with ADLs.    Time Spent in minutes 25 minutes  Procedures:  None  Consultants:   Palliative medicine  Antimicrobials:   Anti-infectives (From admission, onward)   Start     Dose/Rate Route Frequency Ordered Stop   02/26/20 0421  cefTRIAXone (ROCEPHIN) 1 g in sodium chloride 0.9 % 100 mL IVPB  Status:  Discontinued     1 g 200 mL/hr  over 30 Minutes Intravenous Every 24 hours 02/26/20 0421 02/26/20 0422   02/25/20 1900  cefTRIAXone (ROCEPHIN) 1 g in sodium chloride 0.9 % 100 mL IVPB  Status:  Discontinued     1 g 200 mL/hr over 30 Minutes Intravenous Every 24 hours 02/25/20 1830 02/28/20 0927         Medications  Scheduled Meds: . donepezil  10 mg Oral QHS  . heparin  5,000 Units Subcutaneous Q8H  . OLANZapine  2.5 mg Oral QHS  . sodium chloride flush  10-40 mL Intracatheter Q12H   Continuous Infusions:  PRN Meds:.      Subjective:   Katie Jackson was seen and examined today.  Alert and awake, slept good.  No acute issues overnight.  Has telemetry sitter in the room.  No nausea vomiting abdominal pain or diarrhea.    Objective:   Vitals:   03/01/20 2009 03/02/20 0550 03/02/20 1424 03/02/20 1438  BP: (!) 142/70 (!) 159/77  (!) 178/70  Pulse: 72 98  75  Resp: 15 19  16   Temp: 98.8 F (37.1 C) 98.5 F (36.9 C)  98.1 F (36.7 C)  TempSrc: Oral Oral  Oral  SpO2: 99% 100% 100% 99%  Weight:      Height:        Intake/Output Summary (Last 24 hours) at 03/02/2020 1559 Last data filed at 03/02/2020 1520 Gross per 24 hour  Intake 1667.25 ml  Output --  Net 1667.25 ml     Wt Readings from Last 3 Encounters:  02/25/20 45.8 kg  08/02/18 51.7 kg  06/10/18 54.9 kg   Physical Exam  General: Alert and oriented x self and place, pleasant  Cardiovascular: S1 S2 clear, RRR. No pedal edema b/l  Respiratory: CTAB, no wheezing, rales or rhonchi  Gastrointestinal: Soft, nontender, nondistended, NBS  Ext: no pedal edema bilaterally  Neuro: no new deficits  Musculoskeletal: No cyanosis, clubbing  Skin: No rashes  Psych: oriented to self and place, has underlying dementia    Data Reviewed:  I have personally reviewed following labs and imaging studies  Micro Results Recent Results (from the past 240 hour(s))  SARS CORONAVIRUS 2 (TAT 6-24 HRS) Nasopharyngeal Nasopharyngeal Swab     Status:  None   Collection Time: 02/25/20  5:14 PM   Specimen: Nasopharyngeal Swab  Result Value Ref Range Status   SARS Coronavirus 2 NEGATIVE NEGATIVE Final    Comment: (NOTE) SARS-CoV-2 target nucleic acids are NOT DETECTED. The SARS-CoV-2 RNA is generally detectable in upper and lower respiratory specimens during the acute phase of infection. Negative results do not preclude SARS-CoV-2 infection, do not rule out co-infections with other pathogens, and should not be used as the sole basis for treatment or other patient  management decisions. Negative results must be combined with clinical observations, patient history, and epidemiological information. The expected result is Negative. Fact Sheet for Patients: SugarRoll.be Fact Sheet for Healthcare Providers: https://www.woods-mathews.com/ This test is not yet approved or cleared by the [place] FDA and  has been authorized for detection and/or diagnosis of SARS-CoV-2 by FDA under an Emergency Use Authorization (EUA). This EUA will remain  in effect (meaning this test can be used) for the duration of the COVID-19 declaration under Section 56 4(b)(1) of the Act, 21 U.S.C. section 360bbb-3(b)(1), unless the authorization is terminated or revoked sooner. Performed at [facility], [place] [address], [place], [place] [address]   Urine culture     Status: Abnormal   Collection Time: 02/25/20  5:14 PM   Specimen: Urine, Clean Catch  Result Value Ref Range Status   Specimen Description   Final    URINE, CLEAN CATCH Performed at [facility], [place] [address] [place], [place] [address]    Special Requests   Final    NONE Performed at [facility], [place] [address], [place], [place] [address]    Culture MULTIPLE SPECIES PRESENT, SUGGEST RECOLLECTION (A)  Final   Report Status 02/27/2020 FINAL  Final    Radiology Reports DG Chest 2 View  Result Date:  02/25/2020 CLINICAL DATA:  Weakness, dementia EXAM: CHEST - 2 VIEW COMPARISON:  09/18/2008 FINDINGS: Frontal and lateral views of the chest demonstrate an unremarkable cardiac silhouette. Lungs are hyperinflated without airspace disease, effusion, or pneumothorax. No acute bony abnormalities. IMPRESSION: 1. No acute intrathoracic process. 2. Emphysema. Electronically Signed   By: Randa Ngo M.D.   On: 02/25/2020 16:11   US RENAL  Result Date: 02/26/2020 CLINICAL DATA:  Acute kidney injury EXAM: RENAL / URINARY TRACT ULTRASOUND COMPLETE COMPARISON:  None. FINDINGS: Right Kidney: Renal measurements: 7.4 x 3.6 x 3.8 cm = volume: 52 mL. Generalized cortical thinning. No hydronephrosis or mass. Left Kidney: Renal measurements: 7.7 x 3.6 x 4.3 cm = volume: 61 mL. Generalized cortical thinning. No hydronephrosis or mass. Bladder: Appears normal for degree of bladder distention. IMPRESSION: 1. Symmetric renal atrophy. 2. No hydronephrosis or reversible finding. Electronically Signed   By: Monte Fantasia M.D.   On: 02/26/2020 04:59    Lab Data:  CBC: Recent Labs  Lab 02/25/20 1558 02/25/20 1558 02/26/20 0250 02/26/20 1451 02/29/20 0338 03/01/20 0555 03/01/20 2334  WBC 9.3   < > 7.9 7.9 4.7 4.9 5.6  NEUTROABS 6.3  --   --   --   --   --   --   HGB 13.9   < > 13.2 12.8 9.5* 9.7* 9.9*  HCT 46.2*   < > 43.9 41.9 30.6* 31.8* 31.7*  MCV 94.7   < > 94.4 94.8 94.2 93.3 90.8  PLT 156   < > 138* 136* 97* 101* 113*   < > = values in this interval not displayed.   Basic Metabolic Panel: Recent Labs  Lab 02/26/20 0250 02/26/20 0914 02/27/20 2049 02/27/20 2049 02/28/20 0733 02/29/20 0338 02/29/20 1324 03/01/20 0555 03/01/20 2334  NA 160*   < > 152*  --  148* 148*  --  144 137  K 3.6   < > 3.5   < > 3.3* 2.8* 3.7 4.2 3.8  CL 123*   < > 121*  --  120* 120*  --  116* 111  CO2 22   < > 23  --  21*  23  --  23 21*  GLUCOSE 84   < > 103*  --  90 100*  --  100* 106*  BUN 67*   < > 35*  --  28* 19   --  12 11  CREATININE 4.48*   < > 2.54*  --  2.38* 1.79*  --  1.73* 1.49*  CALCIUM 9.2   < > 7.9*  --  7.7* 7.9*  --  8.0* 8.1*  MG 2.8*  --   --   --   --  2.0  --   --   --   PHOS 3.7  --   --   --   --   --   --   --   --    < > = values in this interval not displayed.   GFR: Estimated Creatinine Clearance: 23.6 mL/min (A) (by C-G formula based on SCr of 1.49 mg/dL (H)). Liver Function Tests: Recent Labs  Lab 02/25/20 1558  AST 22  ALT 13  ALKPHOS 43  BILITOT 1.5*  PROT 6.4*  ALBUMIN 3.5   No results for input(s): LIPASE, AMYLASE in the last 168 hours. No results for input(s): AMMONIA in the last 168 hours. Coagulation Profile: No results for input(s): INR, PROTIME in the last 168 hours. Cardiac Enzymes: No results for input(s): CKTOTAL, CKMB, CKMBINDEX, TROPONINI in the last 168 hours. BNP (last 3 results) No results for input(s): PROBNP in the last 8760 hours. HbA1C: No results for input(s): HGBA1C in the last 72 hours. CBG: No results for input(s): GLUCAP in the last 168 hours. Lipid Profile: No results for input(s): CHOL, HDL, LDLCALC, TRIG, CHOLHDL, LDLDIRECT in the last 72 hours. Thyroid Function Tests: No results for input(s): TSH, T4TOTAL, FREET4, T3FREE, THYROIDAB in the last 72 hours. Anemia Panel: No results for input(s): VITAMINB12, FOLATE, FERRITIN, TIBC, IRON, RETICCTPCT in the last 72 hours. Urine analysis:    Component Value Date/Time   COLORURINE YELLOW 02/25/2020 1714   APPEARANCEUR TURBID (A) 02/25/2020 1714   LABSPEC 1.016 02/25/2020 1714   PHURINE 8.0 02/25/2020 1714   GLUCOSEU NEGATIVE 02/25/2020 1714   HGBUR NEGATIVE 02/25/2020 1714   BILIRUBINUR NEGATIVE 02/25/2020 1714   KETONESUR 5 (A) 02/25/2020 1714   PROTEINUR >=300 (A) 02/25/2020 1714   NITRITE NEGATIVE 02/25/2020 1714   LEUKOCYTESUR MODERATE (A) 02/25/2020 1714     Aleiya Rye M.D. [facility] 03/02/2020, 3:59 PM   Call night coverage person covering after 7pm

## 2020-03-02 NOTE — Progress Notes (Signed)
Physical Therapy Treatment Patient Details Name: Katie Jackson MRN: [id number] DOB: 09/22/1946 Today's Date: 03/02/2020    History of Present Illness 74 year old female admitted with dementia, failure to thrive, HTN, hyperlipidemia, and dysphagia.    PT Comments    Patient resting in bed when PT arrived. She is pleasant but impulsive and high risk for falls. Reduced insight into current medical situation -- She is very impulsive, and she kept climbing out of bed once PT left the room- does have bed alarm and IV is in left arm. She is Mod I, I for all bed mobility, min guard for sit to stand- is safest with direct, close supervision and RW.  She will require 24/7 supervision and cannot live alone. Reviewed ex format with A, AA and cues for LE and UEs- good return demo. Printed sheet was issued. She is generally weak and should benefit from PT while here in hospital as well as ongoing at discharge.   Follow Up Recommendations  Home health PT;Supervision - Intermittent;Supervision for mobility/OOB     Equipment Recommendations  Rolling walker with 5" wheels;Cane    Recommendations for Other Services       Precautions / Restrictions Precautions Precautions: Fall Precaution Comments: impulsive Restrictions Weight Bearing Restrictions: No    Mobility  Bed Mobility Overal bed mobility: Modified Independent             General bed mobility comments: impulsive, verbal cues for safety with IV line  Transfers Overall transfer level: Needs assistance   Transfers: Sit to/from Stand;Stand Pivot Transfers Sit to Stand: Min guard;Supervision Stand pivot transfers: Supervision;Min guard       General transfer comment: She was MOD I , I for all bed mobility  Ambulation/Gait Ambulation/Gait assistance: Supervision;Min guard Gait Distance (Feet): 20 Feet(just at bedside) Assistive device: 1 person hand held assist(Would need RW for safety once discharged.) Gait  Pattern/deviations: Step-through pattern Gait velocity: WFL   General Gait Details: Needs max cues for safety, is at risk for falls   Stairs             Wheelchair Mobility    Modified Rankin (Stroke Patients Only)       Balance Overall balance assessment: Needs assistance Sitting-balance support: Feet supported;No upper extremity supported   Sitting balance - Comments: seated EOB   Standing balance support: During functional activity;Bilateral upper extremity supported Standing balance-Leahy Scale: Fair Standing balance comment: Fair with HHA                            Cognition Arousal/Alertness: Awake/alert Behavior During Therapy: WFL for tasks assessed/performed;Impulsive Overall Cognitive Status: Within Functional Limits for tasks assessed                                 General Comments: She was pleasant and cooperative with PT. No adverse effects this session.      Exercises General Exercises - Lower Extremity Ankle Circles/Pumps: AROM;Supine;AAROM;Both;10 reps Quad Sets: AROM;Supine;AAROM;Both;10 reps Gluteal Sets: AROM;Supine;10 reps Short Arc Quad: AROM;AAROM;Supine;Both;10 reps Long Arc Quad: Both;10 reps Heel Slides: AROM;AAROM;Supine;Both;10 reps Hip ABduction/ADduction: AROM;AAROM;Supine;Both;10 reps Straight Leg Raises: Both;10 reps Hip Flexion/Marching: AROM;AAROM;Supine;Both;10 reps Other Exercises Other Exercises: Instructed in UE stretching flexion and abduction. Printed ex sheet was reviewed and issued to patient with encouragement to perform at least twice daily.    General Comments  Pertinent Vitals/Pain Pain Assessment: No/denies pain    Home Living                      Prior Function            PT Goals (current goals can now be found in the care plan section) Acute Rehab PT Goals Patient Stated Goal: go home PT Goal Formulation: With patient Time For Goal Achievement:  03/06/20 Potential to Achieve Goals: Good Progress towards PT goals: Progressing toward goals    Frequency    Min 3X/week      PT Plan Current plan remains appropriate    Co-evaluation              AM-PAC PT "6 Clicks" Mobility   Outcome Measure  Help needed turning from your back to your side while in a flat bed without using bedrails?: None Help needed moving from lying on your back to sitting on the side of a flat bed without using bedrails?: None Help needed moving to and from a bed to a chair (including a wheelchair)?: A Little Help needed standing up from a chair using your arms (e.g., wheelchair or bedside chair)?: A Little Help needed to walk in hospital room?: A Little Help needed climbing 3-5 steps with a railing? : A Lot 6 Click Score: 19    End of Session   Activity Tolerance: Patient tolerated treatment well Patient left: in bed;with call bell/phone within reach;with bed alarm set   PT Visit Diagnosis: Other abnormalities of gait and mobility (R26.89);Muscle weakness (generalized) (M62.81);Unsteadiness on feet (R26.81)     Time: 0150-0222 PT Time Calculation (min) (ACUTE ONLY): 32 min  Charges:  $Therapeutic Exercise: 8-22 mins $Therapeutic Activity: 8-22 mins                    Rollen Sox, PT # [phone] CGV cell    Casandra Doffing 03/02/2020, 2:36 PM

## 2020-03-03 DIAGNOSIS — G9341 Metabolic encephalopathy: Secondary | ICD-10-CM

## 2020-03-03 DIAGNOSIS — R627 Adult failure to thrive: Secondary | ICD-10-CM

## 2020-03-03 LAB — CBC WITH DIFFERENTIAL/PLATELET
Abs Immature Granulocytes: 0.09 10*3/uL — ABNORMAL HIGH (ref 0.00–0.07)
Basophils Absolute: 0 10*3/uL (ref 0.0–0.1)
Basophils Relative: 1 %
Eosinophils Absolute: 0.1 10*3/uL (ref 0.0–0.5)
Eosinophils Relative: 1 %
HCT: 30.5 % — ABNORMAL LOW (ref 36.0–46.0)
Hemoglobin: 9.6 g/dL — ABNORMAL LOW (ref 12.0–15.0)
Immature Granulocytes: 1 %
Lymphocytes Relative: 23 %
Lymphs Abs: 1.5 10*3/uL (ref 0.7–4.0)
MCH: 28.4 pg (ref 26.0–34.0)
MCHC: 31.5 g/dL (ref 30.0–36.0)
MCV: 90.2 fL (ref 80.0–100.0)
Monocytes Absolute: 0.4 10*3/uL (ref 0.1–1.0)
Monocytes Relative: 6 %
Neutro Abs: 4.4 10*3/uL (ref 1.7–7.7)
Neutrophils Relative %: 68 %
Platelets: 132 10*3/uL — ABNORMAL LOW (ref 150–400)
RBC: 3.38 MIL/uL — ABNORMAL LOW (ref 3.87–5.11)
RDW: 14.6 % (ref 11.5–15.5)
WBC: 6.6 10*3/uL (ref 4.0–10.5)
nRBC: 0 % (ref 0.0–0.2)

## 2020-03-03 LAB — COMPREHENSIVE METABOLIC PANEL
ALT: 13 U/L (ref 0–44)
AST: 19 U/L (ref 15–41)
Albumin: 2.6 g/dL — ABNORMAL LOW (ref 3.5–5.0)
Alkaline Phosphatase: 37 U/L — ABNORMAL LOW (ref 38–126)
Anion gap: 6 (ref 5–15)
BUN: 9 mg/dL (ref 8–23)
CO2: 22 mmol/L (ref 22–32)
Calcium: 8.4 mg/dL — ABNORMAL LOW (ref 8.9–10.3)
Chloride: 111 mmol/L (ref 98–111)
Creatinine, Ser: 1.42 mg/dL — ABNORMAL HIGH (ref 0.44–1.00)
GFR calc Af Amer: 42 mL/min — ABNORMAL LOW (ref >60)
GFR calc non Af Amer: 36 mL/min — ABNORMAL LOW (ref >60)
Glucose, Bld: 106 mg/dL — ABNORMAL HIGH (ref 70–99)
Potassium: 3.5 mmol/L (ref 3.5–5.1)
Sodium: 139 mmol/L (ref 135–145)
Total Bilirubin: 0.5 mg/dL (ref 0.3–1.2)
Total Protein: 5.1 g/dL — ABNORMAL LOW (ref 6.5–8.1)

## 2020-03-03 LAB — MAGNESIUM: Magnesium: 1.6 mg/dL — ABNORMAL LOW (ref 1.7–2.4)

## 2020-03-03 LAB — TSH: TSH: 4.846 u[IU]/mL — ABNORMAL HIGH (ref 0.350–4.500)

## 2020-03-03 MED ORDER — MAGNESIUM SULFATE 2 GM/50ML IV SOLN
2.0000 g | Freq: Once | INTRAVENOUS | Status: AC
  Administered 2020-03-03: 2 g via INTRAVENOUS
  Filled 2020-03-03: qty 50

## 2020-03-03 MED ORDER — AMLODIPINE BESYLATE 10 MG PO TABS: 10.0000 mg | ORAL_TABLET | Freq: Every day | ORAL | 0 refills | Status: DC

## 2020-03-03 NOTE — Progress Notes (Signed)
Pt made ready for discharge home with daughter, [name].  IV taken out, pt changed into clean clothes, belongings packed up, and discharge paperwork given to daughter.  Pt was stable and  in a good mood, very happy to go home.

## 2020-03-03 NOTE — TOC Transition Note (Addendum)
Transition of Care [facility]) - CM/SW Discharge Note   Patient Details  Name: Katie Jackson MRN: [id number] Date of Birth: May 04, 1946  [facility] CM/SW Contact:  Lennart Pall, LCSW Phone Number: 03/03/2020, 5:13 PM   Clinical Narrative:  Pt cleared for d/c home today per MD.  Spoke with pt and daughter and daughter reports family to be providing 24/7 supervision.  REviewed DME and [place] arrangements.  Daughter will be taking pt to her PCP tomorrow and to have PCS aide/ Medicaid forms completed at that time as well.       Final next level of care: [place] Barriers to Discharge: Barriers Resolved   Patient Goals and CMS Choice     Choice offered to / list presented to : Patient, Adult Children  Discharge Placement                       Discharge Plan and Services   Discharge Planning Services: CM Consult            DME Arranged: Gilford Rile rolling DME Agency: [employer] Date DME Agency Contacted: 03/03/20 Time DME Agency Contacted: 98 Representative spoke with at DME Agency: [place]: RN, PT, OT, Social Work [place] Date [place]: 03/03/20 Time [place]: 1500 Representative spoke with at [place]: Amy  Social Determinants of Health ([place]) Interventions     Readmission Risk Interventions Readmission Risk Prevention Plan 03/03/2020  Post Dischage Appt Complete  Medication Screening Complete  Transportation Screening Complete  Some recent data might be hidden

## 2020-03-03 NOTE — Progress Notes (Signed)
MD paged to contact pt's daughter and provide update per daughters request.

## 2020-03-03 NOTE — Discharge Summary (Signed)
Discharge Summary  Katie Jackson [id number] DOB: 02/22/1946  PCP: Wenda Low, MD  Admit date: 02/25/2020 Discharge date: 03/03/2020  Time spent: 15mins, more than 50% time spent on coordination of care.  Recommendations for Outpatient Follow-up:  1. F/u with PCP within a week  for hospital discharge follow up, repeat cbc/bmp at follow up 2. Maximize home health, family will  make arrangement for 24 x 7 supervision at home.  Discharge Diagnoses:  Active Hospital Problems   Diagnosis Date Noted  . Dementia with behavioral disturbance ([place]) 02/28/2020  . Pressure injury of skin 02/26/2020  . AKI (acute kidney injury) ([place]) 02/25/2020    Resolved Hospital Problems  No resolved problems to display.    Discharge Condition: stable  Diet recommendation: heart healthy  Filed Weights   02/25/20 2210  Weight: 45.8 kg    History of present illness: (Per admitting physician Dr. Marthenia Rolling) PCP: Wenda Low, MD  Outpatient Specialists:    Patient coming from: Home  Chief Complaint: Poor p.o. intake  HPI:  Patient is a 74 year old Caucasian female with past medical history significant for dementia, hypertension and hyperlipidemia.  Due to the dementing illness, patient is unable to give any significant history.  As per collateral information, patient has had poor p.o. intake in the last 2 weeks, more confused, feeling weak with reported difficulty swallowing.  On presentation to the hospital, patient was found to have a sodium of 161, potassium of 3.2, BUN of 71, serum creatinine of 5.25 (up from 1.07 on 11/05/2017).  UA revealed 5 ketone, moderate leukocyte esterase, greater than 300 protein, specific gravity of 1.016, few bacteria and greater than 50 WBC in the urine.  As mentioned above, patient is unable to give any history.  Hospitalist team has been asked to admit patient.   ED Course: On presentation to the hospital, vitals revealed temperature of 98, blood  pressure of 94/49, heart rate of 87 bpm, respiratory rate of 15 and O2 sat of 98%.  ER provider has infused IV fluids, started initial work-up as well as IV antibiotics (Rocephin).  Hospitalist team has been asked to admit patient.  Hospital Course:  Principal Problem:   Dementia with behavioral disturbance ([place]) Active Problems:   AKI (acute kidney injury) ([place])   Pressure injury of skin  AKI on CKD 2 -Unclear baseline, creatinine was 1.07 in 11/05/2017, may have developed CKD in the interim.  Patient presented with a creatinine of 5.25 on admission. -Renal ultrasound showed no obstruction or hydronephrosis. -Both dehydration and UTI contribute to AKI -She received hydration and IV antibiotics -Creatinine come down to 1.7 today -Oral intake improved, patient and family desires to bring patient home, patient to follow-up with PCP closely -Avoid NSAIDs, lisinopril discontinued  Acute hypernatremia with severe dehydration -Sodium 161 on admission,  -Sodium normalized after hydration -Oral intake has much improved, he ate 100% of lunch today -Courage oral intake, daughter states patient has insulin at home.  She also set up Meals on Wheels service for patient.  Hypokalemia -Resolved  Acute encephalopathy on dementia with agitation -Likely worsened due to acute kidney injury, acute hyponatremia, UTI -Resolved, she is AAOx3 today, she does has memory deficit - will require 24 x 7 supervision, family aware and will arrange  UTI -Continue IV Rocephin for 3 days, completed antibiotics course -With history of recurrent UTI, follow-up with PCP  Reported dysphagia: SLP evaluation completed on 3/10, continue regular diet with thin liquids  Failure to thrive, underlying dementia, falls -Palliative  medicine consulted for goals of care.   - Patient's daughter requested full scope of treatment -Maximize home health , daughter report family will arrange 24 x 7 supervision  -follow-up with  PCP  Essential HTN: BP elevated. Placed on norvasc 10mg  daily Discontinue lisinopril  Pressure injury, Mid sacrum stage I, present on admission Wound care per nursing Pressure Injury 02/26/20 Sacrum Mid Stage 1 -  Intact skin with non-blanchable redness of a localized area usually over a bony prominence. (Active)  02/26/20 1444  Location: Sacrum  Location Orientation: Mid  Staging: Stage 1 -  Intact skin with non-blanchable redness of a localized area usually over a bony prominence.  Wound Description (Comments):   Present on Admission: Yes    Severe protein calorie malnutrition Body mass index is 17.89 kg/m. Nutrition supplement, daughter will get Ensure Daughter will arrange Meals on Wheels     Procedures:  none  Consultations:  Palliative care  PT  Speech therapist  Case manager  Discharge Exam: BP (!) 126/52 (BP Location: Right Arm)   Pulse 86   Temp 98.9 F (37.2 C)   Resp 16   Ht 5\' 3"  (1.6 m)   Wt 45.8 kg   SpO2 98%   BMI 17.89 kg/m   General: NAD, pleasant, AAOx3, does has memory deficit, thin Cardiovascular: RRR Respiratory: Clear to auscultation bilaterally  Discharge Instructions You were cared for by a hospitalist during your hospital stay. If you have any questions about your discharge medications or the care you received while you were in the hospital after you are discharged, you can call the unit and asked to speak with the hospitalist on call if the hospitalist that took care of you is not available. Once you are discharged, your primary care physician will handle any further medical issues. Please note that NO REFILLS for any discharge medications will be authorized once you are discharged, as it is imperative that you return to your primary care physician (or establish a relationship with a primary care physician if you do not have one) for your aftercare needs so that they can reassess your need for medications and monitor your lab  values.  Discharge Instructions    Diet - low sodium heart healthy   Complete by: As directed    Increase activity slowly   Complete by: As directed      Allergies as of 03/03/2020      Reactions   Penicillins Other (See Comments)   From childhood; reaction not recalled- Has patient had a PCN reaction causing immediate rash, facial/tongue/throat swelling, SOB or lightheadedness with hypotension: Unk Has patient had a PCN reaction causing severe rash involving mucus membranes or skin necrosis: Unk Has patient had a PCN reaction that required hospitalization: Unk Has patient had a PCN reaction occurring within the last 10 years: No If all of the above answers are "NO", then may proceed with Cephalosporin use.      Medication List    STOP taking these medications   cephALEXin 250 MG capsule Commonly known as: KEFLEX   escitalopram 10 MG tablet Commonly known as: LEXAPRO   ibuprofen 200 MG tablet Commonly known as: ADVIL   lisinopril 20 MG tablet Commonly known as: ZESTRIL   Valtrex 1000 MG tablet Generic drug: valACYclovir     TAKE these medications   amLODipine 10 MG tablet Commonly known as: NORVASC Take 1 tablet (10 mg total) by mouth daily. Start taking on: March 04, 2020   CALCIUM 600/VITAMIN D3  PO Take 1 tablet by mouth 2 (two) times daily with a meal.   donepezil 10 MG tablet Commonly known as: ARICEPT TAKE 1 TABLET BY MOUTH AT BEDTIME   lovastatin 20 MG tablet Commonly known as: MEVACOR Take 20 mg by mouth at bedtime.            Durable Medical Equipment  (From admission, onward)         Start     Ordered   03/01/20 1408  For home use only DME Cane  Once     03/01/20 1407   03/01/20 1407  For home use only DME Walker rolling  Once    Question Answer Comment  Walker: With 5 Inch Wheels   Patient needs a walker to treat with the following condition Gait instability      03/01/20 1407         Allergies  Allergen Reactions  .  Penicillins Other (See Comments)    From childhood; reaction not recalled- Has patient had a PCN reaction causing immediate rash, facial/tongue/throat swelling, SOB or lightheadedness with hypotension: Unk Has patient had a PCN reaction causing severe rash involving mucus membranes or skin necrosis: Unk Has patient had a PCN reaction that required hospitalization: Unk Has patient had a PCN reaction occurring within the last 10 years: No If all of the above answers are "NO", then may proceed with Cephalosporin use.       The results of significant diagnostics from this hospitalization (including imaging, microbiology, ancillary and laboratory) are listed below for reference.    Significant Diagnostic Studies: DG Chest 2 View  Result Date: 02/25/2020 CLINICAL DATA:  Weakness, dementia EXAM: CHEST - 2 VIEW COMPARISON:  09/18/2008 FINDINGS: Frontal and lateral views of the chest demonstrate an unremarkable cardiac silhouette. Lungs are hyperinflated without airspace disease, effusion, or pneumothorax. No acute bony abnormalities. IMPRESSION: 1. No acute intrathoracic process. 2. Emphysema. Electronically Signed   By: Randa Ngo M.D.   On: 02/25/2020 16:11   US RENAL  Result Date: 02/26/2020 CLINICAL DATA:  Acute kidney injury EXAM: RENAL / URINARY TRACT ULTRASOUND COMPLETE COMPARISON:  None. FINDINGS: Right Kidney: Renal measurements: 7.4 x 3.6 x 3.8 cm = volume: 52 mL. Generalized cortical thinning. No hydronephrosis or mass. Left Kidney: Renal measurements: 7.7 x 3.6 x 4.3 cm = volume: 61 mL. Generalized cortical thinning. No hydronephrosis or mass. Bladder: Appears normal for degree of bladder distention. IMPRESSION: 1. Symmetric renal atrophy. 2. No hydronephrosis or reversible finding. Electronically Signed   By: Monte Fantasia M.D.   On: 02/26/2020 04:59    Microbiology: Recent Results (from the past 240 hour(s))  SARS CORONAVIRUS 2 (TAT 6-24 HRS) Nasopharyngeal Nasopharyngeal Swab      Status: None   Collection Time: 02/25/20  5:14 PM   Specimen: Nasopharyngeal Swab  Result Value Ref Range Status   SARS Coronavirus 2 NEGATIVE NEGATIVE Final    Comment: (NOTE) SARS-CoV-2 target nucleic acids are NOT DETECTED. The SARS-CoV-2 RNA is generally detectable in upper and lower respiratory specimens during the acute phase of infection. Negative results do not preclude SARS-CoV-2 infection, do not rule out co-infections with other pathogens, and should not be used as the sole basis for treatment or other patient management decisions. Negative results must be combined with clinical observations, patient history, and epidemiological information. The expected result is Negative. Fact Sheet for Patients: SugarRoll.be Fact Sheet for Healthcare Providers: https://www.woods-mathews.com/ This test is not yet approved or cleared by the [place] FDA  and  has been authorized for detection and/or diagnosis of SARS-CoV-2 by FDA under an Emergency Use Authorization (EUA). This EUA will remain  in effect (meaning this test can be used) for the duration of the COVID-19 declaration under Section 56 4(b)(1) of the Act, 21 U.S.C. section 360bbb-3(b)(1), unless the authorization is terminated or revoked sooner. Performed at [facility], [place] [address] [place], [place] [address]   Urine culture     Status: Abnormal   Collection Time: 02/25/20  5:14 PM   Specimen: Urine, Clean Catch  Result Value Ref Range Status   Specimen Description   Final    URINE, CLEAN CATCH Performed at [facility], [place] [address], [place], [place] [address]    Special Requests   Final    NONE Performed at [facility], [place] [address] [place], [place] [address]    Culture MULTIPLE SPECIES PRESENT, SUGGEST RECOLLECTION (A)  Final   Report Status 02/27/2020 FINAL  Final     Labs: Basic Metabolic Panel: Recent Labs   Lab 02/26/20 0250 02/26/20 0914 02/28/20 0733 02/28/20 0733 02/29/20 0338 02/29/20 1324 03/01/20 0555 03/01/20 2334 03/03/20 0355  NA 160*   < > 148*  --  148*  --  144 137 139  K 3.6   < > 3.3*   < > 2.8* 3.7 4.2 3.8 3.5  CL 123*   < > 120*  --  120*  --  116* 111 111  CO2 22   < > 21*  --  23  --  23 21* 22  GLUCOSE 84   < > 90  --  100*  --  100* 106* 106*  BUN 67*   < > 28*  --  19  --  12 11 9   CREATININE 4.48*   < > 2.38*  --  1.79*  --  1.73* 1.49* 1.42*  CALCIUM 9.2   < > 7.7*  --  7.9*  --  8.0* 8.1* 8.4*  MG 2.8*  --   --   --  2.0  --   --   --  1.6*  PHOS 3.7  --   --   --   --   --   --   --   --    < > = values in this interval not displayed.   Liver Function Tests: Recent Labs  Lab 02/25/20 1558 03/03/20 0355  AST 22 19  ALT 13 13  ALKPHOS 43 37*  BILITOT 1.5* 0.5  PROT 6.4* 5.1*  ALBUMIN 3.5 2.6*   No results for input(s): LIPASE, AMYLASE in the last 168 hours. No results for input(s): AMMONIA in the last 168 hours. CBC: Recent Labs  Lab 02/25/20 1558 02/26/20 0250 02/26/20 1451 02/29/20 0338 03/01/20 0555 03/01/20 2334 03/03/20 0355  WBC 9.3   < > 7.9 4.7 4.9 5.6 6.6  NEUTROABS 6.3  --   --   --   --   --  4.4  HGB 13.9   < > 12.8 9.5* 9.7* 9.9* 9.6*  HCT 46.2*   < > 41.9 30.6* 31.8* 31.7* 30.5*  MCV 94.7   < > 94.8 94.2 93.3 90.8 90.2  PLT 156   < > 136* 97* 101* 113* 132*   < > = values in this interval not displayed.   Cardiac Enzymes: No results for input(s): CKTOTAL, CKMB, CKMBINDEX, TROPONINI in the last 168 hours. BNP: BNP (last 3 results) No results  for input(s): BNP in the last 8760 hours.  ProBNP (last 3 results) No results for input(s): PROBNP in the last 8760 hours.  CBG: No results for input(s): GLUCAP in the last 168 hours.     Signed:  Florencia Reasons MD, PhD, FACP  [facility] 03/03/2020, 2:37 PM

## 2020-03-03 NOTE — Progress Notes (Signed)
Physical Therapy Treatment Patient Details Name: Katie Jackson MRN: [id number] DOB: 14-Jan-1946 Today's Date: 03/03/2020    History of Present Illness 74 year old female admitted with dementia, failure to thrive, HTN, hyperlipidemia, and dysphagia.    PT Comments    Patient resting in bed when PT arrived. Possible discharge later today. Patient did agree to ex format, but declined any OOB activity. Performed all ex as outlined on flow sheet- 2 sets of 10 reps each, with rest in between. Wanted to go back to sleep at end of session. Bed in low position, handrails up, bed alarm activated, covers in place- remote/call light in reach. BS table adjacent to bed in reach. Should continue to benefit from PT and ongoing when discharged - with at least intermittent supervision. She is at risk for falls, very impulsive.   Follow Up Recommendations  Home health PT;Supervision - Intermittent;Supervision for mobility/OOB     Equipment Recommendations  Rolling walker with 5" wheels;Cane    Recommendations for Other Services       Precautions / Restrictions Precautions Precautions: Fall Precaution Comments: impulsive Restrictions Weight Bearing Restrictions: No    Mobility  Bed Mobility Overal bed mobility: Modified Independent(Preferred to sleep, but did agree to ex format.)                Transfers                    Ambulation/Gait                 Stairs             Wheelchair Mobility    Modified Rankin (Stroke Patients Only)       Balance Overall balance assessment: Needs assistance Sitting-balance support: Feet supported;No upper extremity supported Sitting balance-Leahy Scale: Good Sitting balance - Comments: seated EOB- but refused any OOB activity, did agree to perform ex as instructed.                                    Cognition Arousal/Alertness: Awake/alert Behavior During Therapy: WFL for tasks  assessed/performed;Impulsive Overall Cognitive Status: Within Functional Limits for tasks assessed                                 General Comments: She was pleasant and cooperative with PT regarding agreeing to ex format. . No adverse effects this session.      Exercises General Exercises - Lower Extremity Ankle Circles/Pumps: AROM;AAROM;Both;10 reps;Supine Quad Sets: AROM;Supine;AAROM;Both;10 reps Gluteal Sets: AROM;Supine;10 reps Short Arc Quad: AROM;AAROM;Supine;Both;10 reps Long Arc Quad: Both;10 reps;Supine;AROM;AAROM Heel Slides: AROM;AAROM;Supine;Both;10 reps Hip ABduction/ADduction: AROM;AAROM;Supine;Both;10 reps Straight Leg Raises: Both;10 reps Hip Flexion/Marching: AROM;AAROM;Supine;Both;10 reps Other Exercises Other Exercises: Instructed in UE stretching flexion and abduction. Printed ex sheet was reviewed and issued to patient with encouragement to perform at least twice daily. Other Exercises: Performed 2 sets of 10 reps each- rest in between.    General Comments        Pertinent Vitals/Pain Pain Assessment: No/denies pain    Home Living Family/patient expects to be discharged to:: Private residence Living Arrangements: Alone Available Help at Discharge: Family;Available 24 hours/day Type of Home: House              Prior Function            PT Goals (  current goals can now be found in the care plan section) Acute Rehab PT Goals Patient Stated Goal: go home PT Goal Formulation: With patient Time For Goal Achievement: 03/06/20 Potential to Achieve Goals: Good Progress towards PT goals: Progressing toward goals    Frequency    Min 3X/week      PT Plan Current plan remains appropriate    Co-evaluation              AM-PAC PT "6 Clicks" Mobility   Outcome Measure  Help needed turning from your back to your side while in a flat bed without using bedrails?: None Help needed moving from lying on your back to sitting on the  side of a flat bed without using bedrails?: None Help needed moving to and from a bed to a chair (including a wheelchair)?: A Little Help needed standing up from a chair using your arms (e.g., wheelchair or bedside chair)?: A Little Help needed to walk in hospital room?: A Little Help needed climbing 3-5 steps with a railing? : A Lot 6 Click Score: 19    End of Session   Activity Tolerance: Patient tolerated treatment well Patient left: in bed;with call bell/phone within reach;with bed alarm set   PT Visit Diagnosis: Other abnormalities of gait and mobility (R26.89);Muscle weakness (generalized) (M62.81);Unsteadiness on feet (R26.81)     Time: 1400-1433 PT Time Calculation (min) (ACUTE ONLY): 33 min  Charges:  $Therapeutic Exercise: 23-37 mins                   Rollen Sox, PT # [phone] CGV cell   Casandra Doffing 03/03/2020, 3:25 PM

## 2020-03-17 ENCOUNTER — Other Ambulatory Visit: Payer: Self-pay | Admitting: Neurology

## 2020-03-20 ENCOUNTER — Other Ambulatory Visit: Payer: Self-pay | Admitting: Neurology

## 2020-07-07 ENCOUNTER — Encounter: Payer: Self-pay | Admitting: Neurology

## 2020-07-07 ENCOUNTER — Ambulatory Visit (INDEPENDENT_AMBULATORY_CARE_PROVIDER_SITE_OTHER): Payer: Medicare Other | Admitting: Neurology

## 2020-07-07 ENCOUNTER — Other Ambulatory Visit: Payer: Self-pay

## 2020-07-07 VITALS — BP 145/75 | HR 87 | Ht 63.0 in | Wt 97.4 lb

## 2020-07-07 DIAGNOSIS — F0281 Dementia in other diseases classified elsewhere with behavioral disturbance: Secondary | ICD-10-CM

## 2020-07-07 DIAGNOSIS — F02818 Dementia in other diseases classified elsewhere, unspecified severity, with other behavioral disturbance: Secondary | ICD-10-CM

## 2020-07-07 DIAGNOSIS — G301 Alzheimer's disease with late onset: Secondary | ICD-10-CM

## 2020-07-07 NOTE — Patient Instructions (Signed)
Good to see you. Continue all your medications. Call our office for any significant changes in behaviors. Continue 24/7 care. Follow-up in 6 months, call for any changes.   FALL PRECAUTIONS: Be cautious when walking. Scan the area for obstacles that may increase the risk of trips and falls. When getting up in the mornings, sit up at the edge of the bed for a few minutes before getting out of bed. Consider elevating the bed at the head end to avoid drop of blood pressure when getting up. Walk always in a well-lit room (use night lights in the walls). Avoid area rugs or power cords from appliances in the middle of the walkways. Use a walker or a cane if necessary and consider physical therapy for balance exercise. Get your eyesight checked regularly.  HOME SAFETY: Consider the safety of the kitchen when operating appliances like stoves, microwave oven, and blender. Consider having supervision and share cooking responsibilities until no longer able to participate in those. Accidents with firearms and other hazards in the house should be identified and addressed as well.  ABILITY TO BE LEFT ALONE: If patient is unable to contact 911 operator, consider using LifeLine, or when the need is there, arrange for someone to stay with patients. Smoking is a fire hazard, consider supervision or cessation. Risk of wandering should be assessed by caregiver and if detected at any point, supervision and safe proof recommendations should be instituted.  RECOMMENDATIONS FOR ALL PATIENTS WITH MEMORY PROBLEMS: 1. Continue to exercise (Recommend 30 minutes of walking everyday, or 3 hours every week) 2. Increase social interactions - continue going to [place] and enjoy social gatherings with friends and family 3. Eat healthy, avoid fried foods and eat more fruits and vegetables 4. Maintain adequate blood pressure, blood sugar, and blood cholesterol level. Reducing the risk of stroke and cardiovascular disease also helps  promoting better memory. 5. Avoid stressful situations. Live a simple life and avoid aggravations. Organize your time and prepare for the next day in anticipation. 6. Sleep well, avoid any interruptions of sleep and avoid any distractions in the bedroom that may interfere with adequate sleep quality 7. Avoid sugar, avoid sweets as there is a strong link between excessive sugar intake, diabetes, and cognitive impairment The Mediterranean diet has been shown to help patients reduce the risk of progressive memory disorders and reduces cardiovascular risk. This includes eating fish, eat fruits and green leafy vegetables, nuts like almonds and hazelnuts, walnuts, and also use olive oil. Avoid fast foods and fried foods as much as possible. Avoid sweets and sugar as sugar use has been linked to worsening of memory function.  There is always a concern of gradual progression of memory problems. If this is the case, then we may need to adjust level of care according to patient needs. Support, both to the patient and caregiver, should then be put into place.

## 2020-07-07 NOTE — Progress Notes (Signed)
NEUROLOGY FOLLOW UP OFFICE NOTE  Katie Jackson [id number] 1946-09-04  HISTORY OF PRESENT ILLNESS: I had the pleasure of seeing Keimani Jackson in follow-up in the neurology clinic on 07/07/2020.  The patient was last seen in 07/2018 for dementia and is again accompanied by her daughter who helps supplement the history today.  Records and images were personally reviewed where available.  MOCA score 27/30 in 07/2018, however symptoms suggestive of Alzheimer's disease. Head CT showed bilateral hippocampi atrophy. She has been on Donepezil 10mg  daily. Since her last visit, there has been continued decline. She was admitted to the hospital in March and deemed unable to live alone, so her granddaughter Katie Jackson moved in with her. She does not drive. She was not taking her medications so now [facility] administers medications. Her daughter manages finances. She puts on the same clothes, Katie Jackson has remind her to change. [name] helps her in and out of the shower. She has Meals on Wheels. She is having more behavioral changes. No paranoia or hallucinations. She constantly throws out food in the house, even if they don't need to go. She has started taking the trash out so they don't see what she throws out. She says she does not like the food so she throws them out, however she has thrown out [name]'s things too, such as her Gatorade. She does not sleep well, she would be opening the door at night looking for the newspaper, or fidgeting with the fridge. She was waking [name] up at 3-4AM to watch TV. Dr. Lysle Rubens prescribed an unrecalled sleep medication which knocked her out for several days, so they stopped it. Her daughter reports that she "became a nut" when she was given Xanax and Seroquel in the hospital for delirium. She denies any headaches, dizziness, focal numbness/tingling/weakness, no falls.    History on Initial Assessment 08/02/2018: This is a 74 year old right-handed woman with a history of  hypertension, hyperlipidemia, presenting for evaluation of dementia. She thinks her memory is "pretty well." Her daughter started noticing memory changes a couple of years ago, worse the past year. She lives alone in a senior citizen apartment complex. Her daughter reports difficulties with medications. She was having recurrent UTIs, she said she was taking them, but when she had another UTI in June 2019, her daughter found a full bottle of antibiotic from December 2018. Her daughter started fixing her pillbox but she still does not take it right. Her daughter reports that she has been a victim of several scams, she has joined [facility] and does not know what the policy is for. She joined [employer], her daughter discontinued the membership, then she joined again. She was forgetting to pay her bills and then overpaying 2 months ago. She has mail everywhere. Her daughter has been trying to help her for the past 2-3 months but she would fight her daughter. Her daughter is now POA. She does not cook and goes out twice a day to buy food. She continues to drive and denies getting lost driving. She drives for her neighbors and denies any car accidents. 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 She states she is not doing this anymore. She is independent with dressing and bathing but she kept having a rash on her face, which they think is due to not bathing and washing her hair. She hangs dirty  clothes back in the closet. She has been putting on her eyeliner thickly on her lower lid for the past 2 years, which her family reports is a change. Her daughter has also noticed increased irritability, she can get agitated/aggressive quickly, which is not typical for her. No paranoia or hallucinations. No family history of dementia. She denies any history of significant head injuries or alcohol use. She is taking Donepezil 5mg   daily without side effects. She denies any headaches, dizziness, diplopia, dysarthria, dysphagia, neck/back pain, focal numbness/tingling/weakness, bowel/bladder dysfunction. No anosmia, tremors, no falls.   She had a head CT without contrast done 04/2018 which did not show any acute changes. There was mild diffuse atrophy and bilateral hippocampal atrophy noted.   PAST MEDICAL HISTORY: Past Medical History:  Diagnosis Date  . High cholesterol   . Hypertension     MEDICATIONS: Current Outpatient Medications on File Prior to Visit  Medication Sig Dispense Refill  . amLODipine (NORVASC) 10 MG tablet Take 1 tablet (10 mg total) by mouth daily. 30 tablet 0  . Calcium Carb-Cholecalciferol (CALCIUM 600/VITAMIN D3 PO) Take 1 tablet by mouth 2 (two) times daily with a meal.     . donepezil (ARICEPT) 10 MG tablet TAKE 1 TABLET BY MOUTH AT BEDTIME 30 tablet 0  . lovastatin (MEVACOR) 20 MG tablet Take 20 mg by mouth at bedtime.     No current facility-administered medications on file prior to visit.    ALLERGIES: Allergies  Allergen Reactions  . Penicillins Other (See Comments)    From childhood; reaction not recalled- Has patient had a PCN reaction causing immediate rash, facial/tongue/throat swelling, SOB or lightheadedness with hypotension: Unk Has patient had a PCN reaction causing severe rash involving mucus membranes or skin necrosis: Unk Has patient had a PCN reaction that required hospitalization: Unk Has patient had a PCN reaction occurring within the last 10 years: No If all of the above answers are "NO", then may proceed with Cephalosporin use.     FAMILY HISTORY: History reviewed. No pertinent family history.  SOCIAL HISTORY: Social History   Socioeconomic History  . Marital status: Single    Spouse name: Not on file  . Number of children: Not on file  . Years of education: Not on file  . Highest education level: Not on file  Occupational History  . Not on file   Tobacco Use  . Smoking status: Former Research scientist (life sciences)  . Smokeless tobacco: Never Used  Vaping Use  . Vaping Use: Never used  Substance and Sexual Activity  . Alcohol use: Not Currently  . Drug use: Never  . Sexual activity: Not on file  Other Topics Concern  . 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 Difficulty of Paying Living Expenses:   Food Insecurity:   . Worried About Charity fundraiser in the Last Year:   . Arboriculturist in the Last Year:   Transportation Needs:   . Film/video editor (Medical):   [name] Lack of Transportation (Non-Medical):   Physical Activity:   . Days of Exercise per Week:   . Minutes of Exercise per Session:   Stress:   . Feeling of Stress :   Social Connections:   . Frequency of  Communication with Friends and Family:   . Frequency of Social Gatherings with Friends and Family:   . Attends Religious Services:   . Active Member of Clubs or Organizations:   . Attends Archivist Meetings:   [name] Marital Status:   Intimate Partner Violence:   . Fear of Current or Ex-Partner:   . Emotionally Abused:   [name] Physically Abused:   . Sexually Abused:     PHYSICAL EXAM: Vitals:   07/07/20 1440 07/07/20 1441  BP: (!) 151/72 (!) 145/75  Pulse: 87   SpO2: 98%    General: No acute distress Head:  Normocephalic/atraumatic Skin/Extremities: No rash, no edema Neurological Exam: alert and oriented to person, state. Year is 70. No aphasia or dysarthria. Fund of knowledge is reduced.  Recent and remote memory impaired.  Attention and concentration are reduced. SLUMS score 6/30.  [employer] Mental Exam 07/07/2020  Weekday Correct 0  Current year 0  What state are we in? 1  Amount spent 0  Amount left 0  # of Animals 2  5 objects recall 0  Number series 0  Hour  markers 1  Time correct 0  Placed X in triangle correctly 1  Largest Figure 1  Name of female 0  Date back to work 0  Type of work 0  State she lived in 0  Total score 6   Cranial nerves: Pupils equal, round, reactive to light.   Extraocular movements intact with no nystagmus. Visual fields full.. No facial asymmetry.Motor: Bulk and tone normal, muscle strength 5/5 throughout with no pronator drift.  Deep tendon reflexes 2+ throughout, toes downgoing.  Finger to nose testing intact.  Gait narrow-based and steady, able to tandem walk adequately.  Romberg negative.   IMPRESSION: This is a 74 yo RH woman with a history of hypertension, hyperlipidemia, with moderate Alzheimer's dementia with behavioral changes. SLUMS score today 6/30. She needs assistance with all ADLs. Main concern today is throwing away food constantly,as well as sleep difficulties. Her PCP had prescribed an unrecalled medication for sleep and behaviors which caused significant drowsiness, her daughter is hesitant to start another medication at this time. We discussed the option of adding on Memantine which can sometimes also help with behaviors associated with dementia. She would like to hold off for now. Continue Donepezil 10mg  daily. Continue 24/7 care. She does not drive. Follow-up in 6 months, they know to call for any changes.   Thank you for allowing me to participate in her care.  Please do not hesitate to call for any questions or concerns.   Ellouise Newer, M.D.   CC: Dr. Lysle Rubens

## 2021-01-21 DIAGNOSIS — U071 COVID-19: Secondary | ICD-10-CM | POA: Diagnosis not present

## 2021-01-23 ENCOUNTER — Telehealth: Payer: Self-pay | Admitting: Physician Assistant

## 2021-01-23 NOTE — Telephone Encounter (Signed)
Called to discuss with patient about COVID-19 symptoms and the use of one of the available treatments for those with mild to moderate Covid symptoms and at a high risk of hospitalization.  Pt appears to qualify for outpatient treatment due to co-morbid conditions and/or a member of an at-risk group in accordance with the FDA Emergency Use Authorization.    Symptom onset: 2/1- per referral info Vaccinated: no Booster? no Immunocompromised? no Qualifiers: age, htn, ckd  Unable to reach pt - left message with daughter to call hotline  Angelena Form

## 2021-02-09 ENCOUNTER — Other Ambulatory Visit: Payer: Self-pay

## 2021-02-09 ENCOUNTER — Ambulatory Visit (INDEPENDENT_AMBULATORY_CARE_PROVIDER_SITE_OTHER): Payer: Medicare Other | Admitting: Neurology

## 2021-02-09 ENCOUNTER — Encounter: Payer: Self-pay | Admitting: Neurology

## 2021-02-09 VITALS — BP 135/73 | HR 88 | Ht 63.0 in | Wt 120.8 lb

## 2021-02-09 DIAGNOSIS — G301 Alzheimer's disease with late onset: Secondary | ICD-10-CM | POA: Diagnosis not present

## 2021-02-09 DIAGNOSIS — F0281 Dementia in other diseases classified elsewhere with behavioral disturbance: Secondary | ICD-10-CM

## 2021-02-09 DIAGNOSIS — F02818 Dementia in other diseases classified elsewhere, unspecified severity, with other behavioral disturbance: Secondary | ICD-10-CM

## 2021-02-09 MED ORDER — MIRTAZAPINE 7.5 MG PO TABS: 7.5000 mg | ORAL_TABLET | Freq: Every day | ORAL | 11 refills | Status: DC

## 2021-02-09 MED ORDER — DONEPEZIL HCL 10 MG PO TABS: 10.0000 mg | ORAL_TABLET | Freq: Every day | ORAL | 3 refills | Status: DC

## 2021-02-09 NOTE — Progress Notes (Signed)
NEUROLOGY FOLLOW UP OFFICE NOTE  Katie Jackson [id number] 02-17-1946  HISTORY OF PRESENT ILLNESS: I had the pleasure of seeing [name] in follow-up in the neurology clinic on 02/09/2021.  The patient was last seen 7 months ago for Alzheimer's disease. She is again accompanied by her daughter [name] who helps supplement the history today. SLUMS score 6/30 in 06/2020. She is on Donepezil 10mg  daily.  Since her last visit, [name] reports she has not seen any difference with her memory. Her granddaughter [name] stays with her and manages medications, meals. [employer]. She continues to be restless during the day, moving things around. She picks at her skin with several abrasions in both arms. She needs assistance with bathing, [name] oversees bathing otherwise she may not wash as good. She is able to dress herself. She says she is sleeping fine, [name] reports she gets up a lot and even eats at night. She takes naps during the day. She is on a sweet kick, [name] bought a 12-pack box of ice cream sandwiches yesterday, they were all finished today. No hallucinations. She denies any headaches, dizziness, focal numbness/tingling/weakness, no falls.    History on Initial Assessment 08/02/2018: This is a 75 year old right-handed woman with a history of hypertension, hyperlipidemia, presenting for evaluation of dementia. She thinks her memory is "pretty well." Her daughter started noticing memory changes a couple of years ago, worse the past year. She lives alone in a senior citizen apartment complex. Her daughter reports difficulties with medications. She was having recurrent UTIs, she said she was taking them, but when she had another UTI in June 2019, her daughter found a full bottle of antibiotic from December 2018. Her daughter started fixing her pillbox but she still does not take it right. Her daughter reports that she has been a victim of several scams, she has joined [facility] and  does not know what the policy is for. She joined [employer], her daughter discontinued the membership, then she joined again. She was forgetting to pay her bills and then overpaying 2 months ago. She has mail everywhere. Her daughter has been trying to help her for the past 2-3 months but she would fight her daughter. Her daughter is now POA. She does not cook and goes out twice a day to buy food. She continues to drive and denies getting lost driving. She drives for her neighbors and denies any car accidents. 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 She states she is not doing this anymore. She is independent with dressing and bathing but she kept having a rash on her face, which they think is due to not bathing and washing her hair. She hangs dirty clothes back in the closet. She has been putting on her eyeliner thickly on her lower lid for the past 2 years, which her family reports is a change. Her daughter has also noticed increased irritability, she can get agitated/aggressive quickly, which is not typical for her. No paranoia or hallucinations. No family history of dementia. She denies any history of significant head injuries or alcohol use. She is taking Donepezil 5mg  daily without side effects. She denies any headaches, dizziness, diplopia, dysarthria, dysphagia, neck/back pain, focal numbness/tingling/weakness, bowel/bladder dysfunction. No anosmia, tremors, no falls.   She had a head CT without contrast done 04/2018 which did not show any acute  changes. There was mild diffuse atrophy and bilateral hippocampal atrophy noted.   PAST MEDICAL HISTORY: Past Medical History:  Diagnosis Date  . High cholesterol   . Hypertension     MEDICATIONS: Current Outpatient Medications on File Prior to Visit  Medication Sig Dispense Refill  . amLODipine (NORVASC) 10 MG tablet Take 1 tablet (10 mg total) by  mouth daily. 30 tablet 0  . Calcium Carb-Cholecalciferol (CALCIUM 600/VITAMIN D3 PO) Take 1 tablet by mouth 2 (two) times daily with a meal.     . donepezil (ARICEPT) 10 MG tablet TAKE 1 TABLET BY MOUTH AT BEDTIME 30 tablet 0  . lovastatin (MEVACOR) 20 MG tablet Take 20 mg by mouth at bedtime.     No current facility-administered medications on file prior to visit.    ALLERGIES: Allergies  Allergen Reactions  . Penicillins Other (See Comments)    From childhood; reaction not recalled- Has patient had a PCN reaction causing immediate rash, facial/tongue/throat swelling, SOB or lightheadedness with hypotension: Unk Has patient had a PCN reaction causing severe rash involving mucus membranes or skin necrosis: Unk Has patient had a PCN reaction that required hospitalization: Unk Has patient had a PCN reaction occurring within the last 10 years: No If all of the above answers are "NO", then may proceed with Cephalosporin use.     FAMILY HISTORY: No family history on file.  SOCIAL HISTORY: Social History   Socioeconomic History  . Marital status: Single    Spouse name: Not on file  . Number of children: Not on file  . Years of education: Not on file  . Highest education level: Not on file  Occupational History  . Not on file  Tobacco Use  . Smoking status: Former Research scientist (life sciences)  . Smokeless tobacco: Never Used  Vaping Use  . Vaping Use: Never used  Substance and Sexual Activity  . Alcohol use: Not Currently  . Drug use: Never  . Sexual activity: Not on file  Other Topics Concern  . 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 No aphasia or dysarthria. Fund of knowledge is reduced. Recent and remote memory are impaired, 0/3 delayed recall.Attention and concentration are reduced, 2/5 WORLD backwards.Able to name objects. Cranial nerves: Pupils equal, round. Extraocular movements intact with no nystagmus. Visual fields full.  No facial asymmetry.  Motor: Bulk and tone normal, muscle strength 5/5 throughout with no pronator drift.   Finger to nose testing intact.  Gait narrow-based and steady, able to tandem walk adequately.   IMPRESSION: This is a 75 yo RH woman with a history of hypertension, hyperlipidemia, with moderate Alzheimer's dementia with behavioral changes. SLUMS score 6/30 in July 2021. Memory overall stable, she is having more behavioral and sleep issues, daughter agreeable to starting low  dose mirtazapine 7.5mg  qhs, side effects discussed. If she becomes too drowsy, we can do 1/2 tab qhs. If no side effects and still having issues, can increase as tolerated. Continue Donepezil 10mg  daily. Continue close supervision, she does not drive. Follow-up in 6-8 months, they know to call for any changes.   Thank you for allowing me to participate in her care.  Please do not hesitate to call for any questions or concerns.   Ellouise Newer, M.D.   CC: Dr. Lysle Rubens

## 2021-02-09 NOTE — Patient Instructions (Signed)
1. Start mirtazapine 7.5mg : take 1 tablet every night. Call for an update in a month, if no side effects, we can increase to 15mg  every night  2. Continue Donepezil 10mg  daily  3. Continue close supervision  4. Follow-up in 6-8 months, call for any changes   FALL PRECAUTIONS: Be cautious when walking. Scan the area for obstacles that may increase the risk of trips and falls. When getting up in the mornings, sit up at the edge of the bed for a few minutes before getting out of bed. Consider elevating the bed at the head end to avoid drop of blood pressure when getting up. Walk always in a well-lit room (use night lights in the walls). Avoid area rugs or power cords from appliances in the middle of the walkways. Use a walker or a cane if necessary and consider physical therapy for balance exercise. Get your eyesight checked regularly.   HOME SAFETY: Consider the safety of the kitchen when operating appliances like stoves, microwave oven, and blender. Consider having supervision and share cooking responsibilities until no longer able to participate in those. Accidents with firearms and other hazards in the house should be identified and addressed as well.   ABILITY TO BE LEFT ALONE: If patient is unable to contact 911 operator, consider using LifeLine, or when the need is there, arrange for someone to stay with patients. Smoking is a fire hazard, consider supervision or cessation. Risk of wandering should be assessed by caregiver and if detected at any point, supervision and safe proof recommendations should be instituted.   RECOMMENDATIONS FOR ALL PATIENTS WITH MEMORY PROBLEMS: 1. Continue to exercise (Recommend 30 minutes of walking everyday, or 3 hours every week) 2. Increase social interactions - continue going to [facility] and enjoy social gatherings with friends and family 3. Eat healthy, avoid fried foods and eat more fruits and vegetables 4. Maintain adequate blood pressure, blood sugar, and  blood cholesterol level. Reducing the risk of stroke and cardiovascular disease also helps promoting better memory. 5. Avoid stressful situations. Live a simple life and avoid aggravations. Organize your time and prepare for the next day in anticipation. 6. Sleep well, avoid any interruptions of sleep and avoid any distractions in the bedroom that may interfere with adequate sleep quality 7. Avoid sugar, avoid sweets as there is a strong link between excessive sugar intake, diabetes, and cognitive impairment We discussed the Mediterranean diet, which has been shown to help patients reduce the risk of progressive memory disorders and reduces cardiovascular risk. This includes eating fish, eat fruits and green leafy vegetables, nuts like almonds and hazelnuts, walnuts, and also use olive oil. Avoid fast foods and fried foods as much as possible. Avoid sweets and sugar as sugar use has been linked to worsening of memory function.  There is always a concern of gradual progression of memory problems. If this is the case, then we may need to adjust level of care according to patient needs. Support, both to the patient and caregiver, should then be put into place.

## 2021-03-09 DIAGNOSIS — N183 Chronic kidney disease, stage 3 unspecified: Secondary | ICD-10-CM | POA: Diagnosis not present

## 2021-03-09 DIAGNOSIS — M8588 Other specified disorders of bone density and structure, other site: Secondary | ICD-10-CM | POA: Diagnosis not present

## 2021-03-09 DIAGNOSIS — I10 Essential (primary) hypertension: Secondary | ICD-10-CM | POA: Diagnosis not present

## 2021-03-09 DIAGNOSIS — R7303 Prediabetes: Secondary | ICD-10-CM | POA: Diagnosis not present

## 2021-03-09 DIAGNOSIS — N3020 Other chronic cystitis without hematuria: Secondary | ICD-10-CM | POA: Diagnosis not present

## 2021-03-09 DIAGNOSIS — E782 Mixed hyperlipidemia: Secondary | ICD-10-CM | POA: Diagnosis not present

## 2021-03-09 DIAGNOSIS — Z1389 Encounter for screening for other disorder: Secondary | ICD-10-CM | POA: Diagnosis not present

## 2021-03-09 DIAGNOSIS — Z0000 Encounter for general adult medical examination without abnormal findings: Secondary | ICD-10-CM | POA: Diagnosis not present

## 2021-03-09 DIAGNOSIS — Z1239 Encounter for other screening for malignant neoplasm of breast: Secondary | ICD-10-CM | POA: Diagnosis not present

## 2021-03-09 DIAGNOSIS — Z1159 Encounter for screening for other viral diseases: Secondary | ICD-10-CM | POA: Diagnosis not present

## 2021-03-10 ENCOUNTER — Other Ambulatory Visit: Payer: Self-pay | Admitting: Internal Medicine

## 2021-03-10 DIAGNOSIS — Z1231 Encounter for screening mammogram for malignant neoplasm of breast: Secondary | ICD-10-CM

## 2021-03-10 DIAGNOSIS — M8588 Other specified disorders of bone density and structure, other site: Secondary | ICD-10-CM

## 2021-08-24 DIAGNOSIS — R7303 Prediabetes: Secondary | ICD-10-CM | POA: Diagnosis not present

## 2021-08-24 DIAGNOSIS — N183 Chronic kidney disease, stage 3 unspecified: Secondary | ICD-10-CM | POA: Diagnosis not present

## 2021-08-24 DIAGNOSIS — I10 Essential (primary) hypertension: Secondary | ICD-10-CM | POA: Diagnosis not present

## 2021-08-24 DIAGNOSIS — E782 Mixed hyperlipidemia: Secondary | ICD-10-CM | POA: Diagnosis not present

## 2021-08-24 DIAGNOSIS — Z23 Encounter for immunization: Secondary | ICD-10-CM | POA: Diagnosis not present

## 2021-09-21 ENCOUNTER — Ambulatory Visit
Admission: RE | Admit: 2021-09-21 | Discharge: 2021-09-21 | Disposition: A | Discharge status: Home / Self Care | Payer: Medicare Other | Source: Ambulatory Visit | Attending: Internal Medicine | Admitting: Internal Medicine

## 2021-09-21 ENCOUNTER — Other Ambulatory Visit: Payer: Self-pay

## 2021-09-21 DIAGNOSIS — Z1231 Encounter for screening mammogram for malignant neoplasm of breast: Secondary | ICD-10-CM | POA: Diagnosis not present

## 2021-09-21 DIAGNOSIS — M85832 Other specified disorders of bone density and structure, left forearm: Secondary | ICD-10-CM | POA: Diagnosis not present

## 2021-09-21 DIAGNOSIS — Z780 Asymptomatic menopausal state: Secondary | ICD-10-CM | POA: Diagnosis not present

## 2021-09-21 DIAGNOSIS — M810 Age-related osteoporosis without current pathological fracture: Secondary | ICD-10-CM | POA: Diagnosis not present

## 2021-09-21 DIAGNOSIS — M8588 Other specified disorders of bone density and structure, other site: Secondary | ICD-10-CM

## 2021-10-08 ENCOUNTER — Ambulatory Visit (INDEPENDENT_AMBULATORY_CARE_PROVIDER_SITE_OTHER): Payer: Medicare Other | Admitting: Neurology

## 2021-10-08 ENCOUNTER — Encounter: Payer: Self-pay | Admitting: Neurology

## 2021-10-08 ENCOUNTER — Other Ambulatory Visit: Payer: Self-pay

## 2021-10-08 VITALS — BP 168/74 | HR 87 | Wt 123.4 lb

## 2021-10-08 DIAGNOSIS — F02818 Dementia in other diseases classified elsewhere, unspecified severity, with other behavioral disturbance: Secondary | ICD-10-CM

## 2021-10-08 DIAGNOSIS — G301 Alzheimer's disease with late onset: Secondary | ICD-10-CM | POA: Diagnosis not present

## 2021-10-08 MED ORDER — DONEPEZIL HCL 10 MG PO TABS: 10.0000 mg | ORAL_TABLET | Freq: Every day | ORAL | 3 refills | Status: DC

## 2021-10-08 NOTE — Patient Instructions (Signed)
Good to see you! Continue Donepezil 10mg  daily. Can discuss mood medication with Dr. Lysle Rubens. Follow-up in 6 months, call for any changes   FALL PRECAUTIONS: Be cautious when walking. Scan the area for obstacles that may increase the risk of trips and falls. When getting up in the mornings, sit up at the edge of the bed for a few minutes before getting out of bed. Consider elevating the bed at the head end to avoid drop of blood pressure when getting up. Walk always in a well-lit room (use night lights in the walls). Avoid area rugs or power cords from appliances in the middle of the walkways. Use a walker or a cane if necessary and consider physical therapy for balance exercise. Get your eyesight checked regularly.  HOME SAFETY: Consider the safety of the kitchen when operating appliances like stoves, microwave oven, and blender. Consider having supervision and share cooking responsibilities until no longer able to participate in those. Accidents with firearms and other hazards in the house should be identified and addressed as well.  ABILITY TO BE LEFT ALONE: If patient is unable to contact 911 operator, consider using LifeLine, or when the need is there, arrange for someone to stay with patients. Smoking is a fire hazard, consider supervision or cessation. Risk of wandering should be assessed by caregiver and if detected at any point, supervision and safe proof recommendations should be instituted.   RECOMMENDATIONS FOR ALL PATIENTS WITH MEMORY PROBLEMS: 1. Continue to exercise (Recommend 30 minutes of walking everyday, or 3 hours every week) 2. Increase social interactions - continue going to [place] and enjoy social gatherings with friends and family 3. Eat healthy, avoid fried foods and eat more fruits and vegetables 4. Maintain adequate blood pressure, blood sugar, and blood cholesterol level. Reducing the risk of stroke and cardiovascular disease also helps promoting better memory. 5. Avoid  stressful situations. Live a simple life and avoid aggravations. Organize your time and prepare for the next day in anticipation. 6. Sleep well, avoid any interruptions of sleep and avoid any distractions in the bedroom that may interfere with adequate sleep quality 7. Avoid sugar, avoid sweets as there is a strong link between excessive sugar intake, diabetes, and cognitive impairment We discussed the Mediterranean diet, which has been shown to help patients reduce the risk of progressive memory disorders and reduces cardiovascular risk. This includes eating fish, eat fruits and green leafy vegetables, nuts like almonds and hazelnuts, walnuts, and also use olive oil. Avoid fast foods and fried foods as much as possible. Avoid sweets and sugar as sugar use has been linked to worsening of memory function.  There is always a concern of gradual progression of memory problems. If this is the case, then we may need to adjust level of care according to patient needs. Support, both to the patient and caregiver, should then be put into place.

## 2021-10-08 NOTE — Progress Notes (Signed)
NEUROLOGY FOLLOW UP OFFICE NOTE  Katie Jackson [id number] 07/23/1946  HISTORY OF PRESENT ILLNESS: I had the pleasure of seeing Katie Jackson in follow-up in the neurology clinic on 10/08/2021.  The patient was last seen 8 months ago for Alzheimer's disease with behavioral disturbance. She is again accompanied by her daughter Katie Jackson who helps supplement the history today.  Records and images were personally reviewed where available. On her last visit, she was started on mirtazapine for increasing behavioral and sleep changes. She was started on mirtazapine, however even on lowest dose she had a hard time getting out of bed the next morning so they stopped it. She continues on Donepezil 10mg  daily. Her family manages medications, finances, meals. She needs assistance with dressing and bathing. She feels her memory is alright. Katie Jackson is noticing slow progression, she forgets names of unfamiliar people. No hallucinations or paranoia, but she is "getting ornery, mean." She is up a lot at night. She lives with her granddaughter Katie Jackson, her daughter comes daily to bring groceries. She only wants to eat the same thing all the time. She walks outside her senior living apartment.   History on Initial Assessment 08/02/2018: This is a 75 year old right-handed woman with a history of hypertension, hyperlipidemia, presenting for evaluation of dementia. She thinks her memory is "pretty well." Her daughter started noticing memory changes a couple of years ago, worse the past year. She lives alone in a senior citizen apartment complex. Her daughter reports difficulties with medications. She was having recurrent UTIs, she said she was taking them, but when she had another UTI in June 2019, her daughter found a full bottle of antibiotic from December 2018. Her daughter started fixing her pillbox but she still does not take it right. Her daughter reports that she has been a victim of several scams, she has joined  [facility] and does not know what the policy is for. She joined [employer], her daughter discontinued the membership, then she joined again. She was forgetting to pay her bills and then overpaying 2 months ago. She has mail everywhere. Her daughter has been trying to help her for the past 2-3 months but she would fight her daughter. Her daughter is now POA. She does not cook and goes out twice a day to buy food. She continues to drive and denies getting lost driving. She drives for her neighbors and denies any car accidents. 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 She states she is not doing this anymore. She is independent with dressing and bathing but she kept having a rash on her face, which they think is due to not bathing and washing her hair. She hangs dirty clothes back in the closet. She has been putting on her eyeliner thickly on her lower lid for the past 2 years, which her family reports is a change. Her daughter has also noticed increased irritability, she can get agitated/aggressive quickly, which is not typical for her. No paranoia or hallucinations. No family history of dementia. She denies any history of significant head injuries or alcohol use. She is taking Donepezil 5mg  daily without side effects. She denies any headaches, dizziness, diplopia, dysarthria, dysphagia, neck/back pain, focal numbness/tingling/weakness, bowel/bladder dysfunction. No anosmia, tremors, no falls.    She had a head CT without contrast done 04/2018 which did not show any acute changes. There  was mild diffuse atrophy and bilateral hippocampal atrophy noted.    PAST MEDICAL HISTORY: Past Medical History:  Diagnosis Date   High cholesterol    Hypertension     MEDICATIONS: Current Outpatient Medications on File Prior to Visit  Medication Sig Dispense Refill   amLODipine (NORVASC) 10 MG tablet Take 1 tablet (10  mg total) by mouth daily. 30 tablet 0   Calcium Carb-Cholecalciferol (CALCIUM 600/VITAMIN D3 PO) Take 1 tablet by mouth 2 (two) times daily with a meal.      donepezil (ARICEPT) 10 MG tablet Take 1 tablet (10 mg total) by mouth at bedtime. 90 tablet 3   lovastatin (MEVACOR) 20 MG tablet Take 20 mg by mouth at bedtime.     mirtazapine (REMERON) 7.5 MG tablet Take 1 tablet (7.5 mg total) by mouth at bedtime. 30 tablet 11   No current facility-administered medications on file prior to visit.    ALLERGIES: Allergies  Allergen Reactions   Penicillins Other (See Comments)    From childhood; reaction not recalled- Has patient had a PCN reaction causing immediate rash, facial/tongue/throat swelling, SOB or lightheadedness with hypotension: Unk Has patient had a PCN reaction causing severe rash involving mucus membranes or skin necrosis: Unk Has patient had a PCN reaction that required hospitalization: Unk Has patient had a PCN reaction occurring within the last 10 years: No If all of the above answers are "NO", then may proceed with Cephalosporin use.     FAMILY HISTORY: No family history on file.  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 States it is September 1930, summer. No aphasia or dysarthria. Fund of knowledge is reduced.  Recent and remote memory are impaired.  Attention and concentration are normal. MMSE 17/30. MMSE - Mini Mental State Exam 10/08/2021  Orientation to time 0  Orientation to Place 4  Registration 3  Attention/ Calculation 4  Recall 0  Language- name 2 objects 2  Language- repeat 1  Language- follow 3 step command 2  Language- read & follow direction 1  Write a sentence 0  Copy design 0  Total score 17   Cranial nerves: Pupils equal, round. Extraocular movements intact  with no nystagmus. Visual fields full.  No facial asymmetry.  Motor: Bulk and tone normal, muscle strength 5/5 throughout with no pronator drift.   Finger to nose testing intact.  Gait narrow-based and steady, able to tandem walk adequately.  Romberg negative.   IMPRESSION: This is a 75 yo RH woman with a history of hypertension, hyperlipidemia, with moderate Alzheimer's dementia with behavioral changes. MMSE today 17/30 (SLUMS 6/30 in 06/2020). Symptoms overall stable with slow progression, continue Donepezil 10mg  daily. She did not tolerate mirtazapine, advised increasing activity during the day, looking into day programs. Discuss consideration for medications for mood with PCP. Continue close supervision. We discussed the importance of control of vascular risk factors, physical exercise, MIND diet for overall brain health. Follow-up with [facility] PA Sharene Butters in 6 months, they know to call for any changes.   Thank you for allowing me to participate in her  care.  Please do not hesitate to call for any questions or concerns.    Ellouise Newer, M.D.   CC: Dr. Lysle Rubens

## 2021-10-12 ENCOUNTER — Ambulatory Visit: Payer: Medicare Other | Admitting: Neurology

## 2022-02-28 ENCOUNTER — Other Ambulatory Visit: Payer: Self-pay | Admitting: Neurology

## 2022-03-14 DIAGNOSIS — R7303 Prediabetes: Secondary | ICD-10-CM | POA: Diagnosis not present

## 2022-03-14 DIAGNOSIS — Z1389 Encounter for screening for other disorder: Secondary | ICD-10-CM | POA: Diagnosis not present

## 2022-03-14 DIAGNOSIS — N183 Chronic kidney disease, stage 3 unspecified: Secondary | ICD-10-CM | POA: Diagnosis not present

## 2022-03-14 DIAGNOSIS — Z1159 Encounter for screening for other viral diseases: Secondary | ICD-10-CM | POA: Diagnosis not present

## 2022-03-14 DIAGNOSIS — I10 Essential (primary) hypertension: Secondary | ICD-10-CM | POA: Diagnosis not present

## 2022-03-14 DIAGNOSIS — Z0000 Encounter for general adult medical examination without abnormal findings: Secondary | ICD-10-CM | POA: Diagnosis not present

## 2022-03-14 DIAGNOSIS — M810 Age-related osteoporosis without current pathological fracture: Secondary | ICD-10-CM | POA: Diagnosis not present

## 2022-03-14 DIAGNOSIS — E782 Mixed hyperlipidemia: Secondary | ICD-10-CM | POA: Diagnosis not present

## 2022-04-08 ENCOUNTER — Ambulatory Visit (INDEPENDENT_AMBULATORY_CARE_PROVIDER_SITE_OTHER): Payer: Medicare Other | Admitting: Physician Assistant

## 2022-04-08 ENCOUNTER — Encounter: Payer: Self-pay | Admitting: Physician Assistant

## 2022-04-08 VITALS — BP 125/77 | HR 80 | Resp 18 | Ht 63.0 in | Wt 122.0 lb

## 2022-04-08 DIAGNOSIS — F03918 Unspecified dementia, unspecified severity, with other behavioral disturbance: Secondary | ICD-10-CM

## 2022-04-08 MED ORDER — DONEPEZIL HCL 10 MG PO TABS: 10.0000 mg | ORAL_TABLET | Freq: Every day | ORAL | 3 refills | Status: DC

## 2022-04-08 NOTE — Patient Instructions (Signed)
Good to see you! Continue Donepezil '10mg'$  daily. Can discuss mood medication with Dr. Lysle Rubens. Follow-up in 6 months, call for any changes ?Feel free to visit Facebook page " [employer]" for tips of how to care for people with memory problems.  ? ? ?FALL PRECAUTIONS: Be cautious when walking. Scan the area for obstacles that may increase the risk of trips and falls. When getting up in the mornings, sit up at the edge of the bed for a few minutes before getting out of bed. Consider elevating the bed at the head end to avoid drop of blood pressure when getting up. Walk always in a well-lit room (use night lights in the walls). Avoid area rugs or power cords from appliances in the middle of the walkways. Use a walker or a cane if necessary and consider physical therapy for balance exercise. Get your eyesight checked regularly. ? ?HOME SAFETY: Consider the safety of the kitchen when operating appliances like stoves, microwave oven, and blender. Consider having supervision and share cooking responsibilities until no longer able to participate in those. Accidents with firearms and other hazards in the house should be identified and addressed as well. ? ?ABILITY TO BE LEFT ALONE: If patient is unable to contact 911 operator, consider using LifeLine, or when the need is there, arrange for someone to stay with patients. Smoking is a fire hazard, consider supervision or cessation. Risk of wandering should be assessed by caregiver and if detected at any point, supervision and safe proof recommendations should be instituted. ? ? ?RECOMMENDATIONS FOR ALL PATIENTS WITH MEMORY PROBLEMS: ?1. Continue to exercise (Recommend 30 minutes of walking everyday, or 3 hours every week) ?2. Increase social interactions - continue going to [place] and enjoy social gatherings with friends and family ?3. Eat healthy, avoid fried foods and eat more fruits and vegetables ?4. Maintain adequate blood pressure, blood sugar, and blood cholesterol level.  Reducing the risk of stroke and cardiovascular disease also helps promoting better memory. ?5. Avoid stressful situations. Live a simple life and avoid aggravations. Organize your time and prepare for the next day in anticipation. ?6. Sleep well, avoid any interruptions of sleep and avoid any distractions in the bedroom that may interfere with adequate sleep quality ?7. Avoid sugar, avoid sweets as there is a strong link between excessive sugar intake, diabetes, and cognitive impairment ?We discussed the Mediterranean diet, which has been shown to help patients reduce the risk of progressive memory disorders and reduces cardiovascular risk. This includes eating fish, eat fruits and green leafy vegetables, nuts like almonds and hazelnuts, walnuts, and also use olive oil. Avoid fast foods and fried foods as much as possible. Avoid sweets and sugar as sugar use has been linked to worsening of memory function. ? ?There is always a concern of gradual progression of memory problems. If this is the case, then we may need to adjust level of care according to patient needs. Support, both to the patient and caregiver, should then be put into place. ? ?

## 2022-04-08 NOTE — Progress Notes (Signed)
? ?NEUROLOGY FOLLOW UP OFFICE NOTE ? ?Katie Jackson ?[id number] ?08-20-1946 ? ?HISTORY OF PRESENT ILLNESS: ?I had the pleasure of seeing Katie Jackson in follow-up in the neurology clinic on 04/08/22. She was last seen on 10/08/2021 for Alzheimer's disease with behavioral disturbance. She is accompanied by her granddaughter Katie Jackson who helps supplement the history today.  Records and images were personally reviewed where available.  She continues on Donepezil '10mg'$  daily.  She is no longer on mirtazapine due to intolerance and increased sleepiness.  Her daughter is going to try melatonin this week.  She does not quite frequently during the day.  Her family manages medications, finances, meals. She needs assistance with dressing and bathing, "does not like getting cold before showering ". She feels her memory is alright. Katie Jackson denies any worsening of her memory since last visit.  She forgets names of unfamiliar people. No hallucinations or paranoia, but she is "getting a little testy, mean."   She lives with her granddaughter Katie Jackson, her daughter comes daily to bring groceries.  She continues to want to eat the same thing all the time.  Her appetite is very good.  She likes to walk outside her senior living apartment, there were a "couple of wondering incidents, but does not leave the complex, she just goes to different neighbors houses ".  During the day, when awake she likes to do crossword puzzles and word finding.  ? ? ?History on Initial Assessment 08/02/2018: This is a 76 year old right-handed woman with a history of hypertension, hyperlipidemia, presenting for evaluation of dementia. She thinks her memory is "pretty well." Her daughter started noticing memory changes a couple of years ago, worse the past year. She lives alone in a senior citizen apartment complex. Her daughter reports difficulties with medications. She was having recurrent UTIs, she said she was taking them, but when she had another UTI in  June 2019, her daughter found a full bottle of antibiotic from December 2018. Her daughter started fixing her pillbox but she still does not take it right. Her daughter reports that she has been a victim of several scams, she has joined [facility] and does not know what the policy is for. She joined [employer], her daughter discontinued the membership, then she joined again. She was forgetting to pay her bills and then overpaying 2 months ago. She has mail everywhere. Her daughter has been trying to help her for the past 2-3 months but she would fight her daughter. Her daughter is now POA. She does not cook and goes out twice a day to buy food. She continues to drive and denies getting lost driving. She drives for her neighbors and denies any car accidents. 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 She states she is not doing this anymore. She is independent with dressing and bathing but she kept having a rash on her face, which they think is due to not bathing and washing her hair. She hangs dirty clothes back in the closet. She has been putting on her eyeliner thickly on her lower lid for the past 2 years, which her family reports is a change. Her daughter has also noticed increased irritability, she can get agitated/aggressive quickly, which is not typical for her. No paranoia or hallucinations. No family history of dementia. She denies any history of significant head injuries or alcohol use.  She is taking Donepezil '5mg'$  daily without side effects. She denies any headaches, dizziness, diplopia, dysarthria, dysphagia, neck/back pain, focal numbness/tingling/weakness, bowel/bladder dysfunction. No anosmia, tremors, no falls.  ?  ?She had a head CT without contrast done 04/2018 which did not show any acute changes. There was mild diffuse atrophy and bilateral hippocampal atrophy noted.  ? ? ?PAST MEDICAL  HISTORY: ?Past Medical History:  ?Diagnosis Date  ? High cholesterol   ? Hypertension   ? ? ?MEDICATIONS: ?Current Outpatient Medications on File Prior to Visit  ?Medication Sig Dispense Refill  ? alendronate (FOSAMAX) 70 MG tablet SMARTSIG:1 Tablet(s) By Mouth    ? amLODipine (NORVASC) 10 MG tablet Take 1 tablet (10 mg total) by mouth daily. 30 tablet 0  ? Calcium Carb-Cholecalciferol (CALCIUM 600/VITAMIN D3 PO) Take 1 tablet by mouth 2 (two) times daily with a meal.     ? donepezil (ARICEPT) 10 MG tablet Take 1 tablet (10 mg total) by mouth at bedtime. 90 tablet 3  ? lovastatin (MEVACOR) 20 MG tablet Take 20 mg by mouth at bedtime.    ? ?No current facility-administered medications on file prior to visit.  ? ? ?ALLERGIES: ?Allergies  ?Allergen Reactions  ? Penicillins Other (See Comments)  ?  From childhood; reaction not recalled- ?Has patient had a PCN reaction causing immediate rash, facial/tongue/throat swelling, SOB or lightheadedness with hypotension: Unk ?Has patient had a PCN reaction causing severe rash involving mucus membranes or skin necrosis: Unk ?Has patient had a PCN reaction that required hospitalization: Unk ?Has patient had a PCN reaction occurring within the last 10 years: No ?If all of the above answers are "NO", then may proceed with Cephalosporin use. ?  ? ? ?FAMILY HISTORY: ?History reviewed. No pertinent family history. ? ?SOCIAL HISTORY: ?Social History  ? ?Socioeconomic History  ? Marital status: Single  ?  Spouse name: Not on file  ? Number of children: 5  ? Years of education: 10  ? Highest education level: Not on file  ?Occupational History  ? Not on file  ?Tobacco Use  ? Smoking status: Former  ? Smokeless tobacco: Never  ?Vaping Use  ? Vaping Use: Never used  ?Substance and Sexual Activity  ? Alcohol use: Not Currently  ? Drug use: Never  ? Sexual activity: Not on file  ?Other Topics Concern  ? Not on file  ?Social History Narrative  ? Pt lives grand daughter   ? Has 5 children   ?  10th grade education  ? Retired from [employer]   ? Right handed   ? ?Social Determinants of Health  ? ?Financial Resource Strain: Not on file  ?Food Insecurity: Not on file  ?Transportation Needs: Not on file  ?Physical Activity: Not on file  ?Stress: Not on file  ?Social Connections: Not on file  ?Intimate Partner Violence: Not on file  ? ? ? ?PHYSICAL EXAM: ?Vitals:  ? 04/08/22 1436  ?BP: 125/77  ?Pulse: 80  ?Resp: 18  ?SpO2: 98%  ? ? ?General: No acute distress ?Head:  Normocephalic/atraumatic ?Skin/Extremities: No rash, no edema ?Neurological Exam: alert and oriented to person, place. States it is November 1990, Monday summer.  Delayed recall 0/3.  Able to repeat immediately 2 out of 3 words.  No aphasia or dysarthria. Fund of knowledge is reduced.  Recent and remote memory are impaired.  Attention and concentration are normal.   ? ? ?  10/08/2021  ?  4:00 PM  ?MMSE - Mini Mental State Exam  ?  Orientation to time 0  ?Orientation to Place 4  ?Registration 3  ?Attention/ Calculation 4  ?Recall 0  ?Language- name 2 objects 2  ?Language- repeat 1  ?Language- follow 3 step command 2  ?Language- read & follow direction 1  ?Write a sentence 0  ?Copy design 0  ?Total score 17  ? ?Cranial nerves: Pupils equal, round. Extraocular movements intact with no nystagmus. Visual fields full.  No facial asymmetry.  Motor: Bulk and tone normal, muscle strength 5/5 throughout with no pronator drift.   Finger to nose testing intact.  Gait narrow-based and steady, able to tandem walk adequately.  Romberg negative. ? ? ?IMPRESSION: ?This is a 76 yo RH woman with a history of hypertension, hyperlipidemia, with moderate Alzheimer's dementia with behavioral changes.  Last MMSE in October 2022 was 17/30 Symptoms overall stable with slow progression, continue Donepezil '10mg'$  daily. She did not tolerate mirtazapine and is going to try melatonin, advised increasing activity during the day, looking into day programs.  She was also advised to  try to not nap after 2 PM.  Discuss consideration for medications for mood with PCP. Continue close supervision. We discussed the importance of control of vascular risk factors, physical exercise, MIND diet for over

## 2022-06-15 DIAGNOSIS — R300 Dysuria: Secondary | ICD-10-CM | POA: Diagnosis not present

## 2022-07-05 DIAGNOSIS — L03012 Cellulitis of left finger: Secondary | ICD-10-CM | POA: Diagnosis not present

## 2022-07-05 DIAGNOSIS — R400 Somnolence: Secondary | ICD-10-CM | POA: Diagnosis not present

## 2022-07-11 DIAGNOSIS — R2232 Localized swelling, mass and lump, left upper limb: Secondary | ICD-10-CM | POA: Diagnosis not present

## 2022-07-20 DIAGNOSIS — M79645 Pain in left finger(s): Secondary | ICD-10-CM | POA: Diagnosis not present

## 2022-07-20 DIAGNOSIS — R2232 Localized swelling, mass and lump, left upper limb: Secondary | ICD-10-CM | POA: Diagnosis not present

## 2022-08-01 ENCOUNTER — Other Ambulatory Visit: Payer: Self-pay

## 2022-08-01 DIAGNOSIS — C44629 Squamous cell carcinoma of skin of left upper limb, including shoulder: Secondary | ICD-10-CM | POA: Diagnosis not present

## 2022-08-01 DIAGNOSIS — C4912 Malignant neoplasm of connective and soft tissue of left upper limb, including shoulder: Secondary | ICD-10-CM | POA: Diagnosis not present

## 2022-08-01 DIAGNOSIS — D481 Neoplasm of uncertain behavior of connective and other soft tissue: Secondary | ICD-10-CM | POA: Diagnosis not present

## 2022-08-01 DIAGNOSIS — D487 Neoplasm of uncertain behavior of other specified sites: Secondary | ICD-10-CM | POA: Diagnosis not present

## 2022-08-03 DIAGNOSIS — M25551 Pain in right hip: Secondary | ICD-10-CM | POA: Diagnosis not present

## 2022-08-03 DIAGNOSIS — M79651 Pain in right thigh: Secondary | ICD-10-CM | POA: Diagnosis not present

## 2022-08-03 DIAGNOSIS — S8012XA Contusion of left lower leg, initial encounter: Secondary | ICD-10-CM | POA: Diagnosis not present

## 2022-08-11 DIAGNOSIS — C801 Malignant (primary) neoplasm, unspecified: Secondary | ICD-10-CM

## 2022-08-11 HISTORY — DX: Malignant (primary) neoplasm, unspecified: C80.1

## 2022-09-15 DIAGNOSIS — M810 Age-related osteoporosis without current pathological fracture: Secondary | ICD-10-CM | POA: Diagnosis not present

## 2022-09-15 DIAGNOSIS — R7303 Prediabetes: Secondary | ICD-10-CM | POA: Diagnosis not present

## 2022-09-15 DIAGNOSIS — I10 Essential (primary) hypertension: Secondary | ICD-10-CM | POA: Diagnosis not present

## 2022-09-15 DIAGNOSIS — Z23 Encounter for immunization: Secondary | ICD-10-CM | POA: Diagnosis not present

## 2022-09-15 DIAGNOSIS — N183 Chronic kidney disease, stage 3 unspecified: Secondary | ICD-10-CM | POA: Diagnosis not present

## 2022-09-15 DIAGNOSIS — J449 Chronic obstructive pulmonary disease, unspecified: Secondary | ICD-10-CM | POA: Diagnosis not present

## 2022-09-15 DIAGNOSIS — E782 Mixed hyperlipidemia: Secondary | ICD-10-CM | POA: Diagnosis not present

## 2022-09-21 ENCOUNTER — Other Ambulatory Visit: Payer: Self-pay

## 2022-09-21 DIAGNOSIS — H2511 Age-related nuclear cataract, right eye: Secondary | ICD-10-CM | POA: Diagnosis not present

## 2022-09-21 DIAGNOSIS — C44629 Squamous cell carcinoma of skin of left upper limb, including shoulder: Secondary | ICD-10-CM | POA: Diagnosis not present

## 2022-10-10 ENCOUNTER — Encounter: Payer: Self-pay | Admitting: Physician Assistant

## 2022-10-10 ENCOUNTER — Ambulatory Visit (INDEPENDENT_AMBULATORY_CARE_PROVIDER_SITE_OTHER): Payer: Medicare Other | Admitting: Physician Assistant

## 2022-10-10 ENCOUNTER — Ambulatory Visit: Payer: Medicare Other | Admitting: Neurology

## 2022-10-10 VITALS — BP 146/66 | HR 94 | Resp 18 | Ht 63.0 in | Wt 133.0 lb

## 2022-10-10 DIAGNOSIS — R413 Other amnesia: Secondary | ICD-10-CM

## 2022-10-10 NOTE — Progress Notes (Signed)
Assessment/Plan:   Dementia likely due to  Alzheimer's Disease  Katie Jackson is a very pleasant 76 y.o. RH female with a history of hypertension, hyperlipidemia, with moderate Alzheimer's dementia with behavioral changes seen today in follow up for memory loss.  She is on donepezil 10 mg daily. GIven her cognitive decline, memantine was discussed, but agree with daughter, the risks of the medicine may outweigh the benefits.      Follow up in 6  months. Continue Donepezil 10 mg daily  Referral to speech therapy for some "choking" episodes  Continue close supervision Continue to control mood as per PCP Recommend good control of cardiovascular risk factors.      Subjective:    This patient is accompanied in the office by daughter who supplements the history.  Previous records as well as any outside records available were reviewed prior to todays visit. Last seen on 04/08/22    Any changes in memory since last visit? Forgets family members' names. Still does word-finding and watches TV. Trouble with her words, trouble with pronouncing the words. "She calls everybody [name]".  repeats oneself?  Denies  Disoriented "when walking into a room?  "She knows her house, but if she goes to an unknown place she may be disoriented  Leaving objects in unusual places?  Patient denies    Hoarding: Licks finger on the floor and picks up crumbs, puts cookies and drink in the headboard . She pick ups sticks all the time.  Other habits: she likes to pick her skin and scabs Ambulates  with difficulty?   Patient denies. He "walks up and down" and likes to run to the window, she is nosier"  Recent falls?  Patient denies   Any head injuries?  Patient denies   History of seizures?   Patient denies   Wandering behavior?  In August she left to another apartment and the neighbor brought her home  Patient drives?   Patient no longer drives  Any mood changes since last visit?  Patient denies  "Does  fake cries if she wants  [place] to do something" Any worsening depression?:  Patient denies   Hallucinations?  Patient denies   Paranoia?  Patient denies   Patient reports that sleeps well without vivid dreams, REM behavior or sleepwalking  . "She gets up and down, gets water, milk and she gets a pepsi and forgets she went earlier to pick up a drink"   History of sleep apnea?  Patient denies   Any hygiene concerns? Endorsed, she goes "damn" (does not want to take a shower) . She acts  as if has fallen asleep if she has to take a shower.f  Independent of bathing and dressing?  Needs assistance,  Does the patient needs help with medications? Family  in charge  Who is in charge of the finances?  Family  is in charge   Any changes in appetite?  Patient denies   Patient have trouble swallowing? Patient denies   Does the patient cook?  Patient denies   Any kitchen accidents such as leaving the stove on? Patient denies   Any headaches?  Patient denies   Double vision? Patient denies   Any focal numbness or tingling?  Patient denies   Chronic back pain Patient denies   Unilateral weakness?  Patient denies   Any tremors?  Patient denies   Any history of anosmia?  Patient denies   Any incontinence of urine?  Uses diapers , "sometimes she  puts 2 diapers and a pad" Any bowel dysfunction? Sometimes she has bowel incontinence  Patient lives with: granddaughter she lives with her     History on Initial Assessment 08/02/2018: This is a 76 year old right-handed woman with a history of hypertension, hyperlipidemia, presenting for evaluation of dementia. She thinks her memory is "pretty well." Her daughter started noticing memory changes a couple of years ago, worse the past year. She lives alone in a senior citizen apartment complex. Her daughter reports difficulties with medications. She was having recurrent UTIs, she said she was taking them, but when she had another UTI in June 2019, her daughter found a full  bottle of antibiotic from December 2018. Her daughter started fixing her pillbox but she still does not take it right. Her daughter reports that she has been a victim of several scams, she has joined [facility] and does not know what the policy is for. She joined [employer], her daughter discontinued the membership, then she joined again. She was forgetting to pay her bills and then overpaying 2 months ago. She has mail everywhere. Her daughter has been trying to help her for the past 2-3 months but she would fight her daughter. Her daughter is now POA. She does not cook and goes out twice a day to buy food. She continues to drive and denies getting lost driving. She drives for her neighbors and denies any car accidents. 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 She states she is not doing this anymore. She is independent with dressing and bathing but she kept having a rash on her face, which they think is due to not bathing and washing her hair. She hangs dirty clothes back in the closet. She has been putting on her eyeliner thickly on her lower lid for the past 2 years, which her family reports is a change. Her daughter has also noticed increased irritability, she can get agitated/aggressive quickly, which is not typical for her. No paranoia or hallucinations. No family history of dementia. She denies any history of significant head injuries or alcohol use. She is taking Donepezil '5mg'$  daily without side effects. She denies any headaches, dizziness, diplopia, dysarthria, dysphagia, neck/back pain, focal numbness/tingling/weakness, bowel/bladder dysfunction. No anosmia, tremors, no falls.    She had a head CT without contrast done 04/2018 which did not show any acute changes. There was mild diffuse atrophy and bilateral hippocampal atrophy noted.      PREVIOUS MEDICATIONS:   CURRENT MEDICATIONS:  Outpatient  Encounter Medications as of 10/10/2022  Medication Sig   amLODipine (NORVASC) 10 MG tablet Take 1 tablet (10 mg total) by mouth daily.   Calcium Carb-Cholecalciferol (CALCIUM 600/VITAMIN D3 PO) Take 1 tablet by mouth 2 (two) times daily with a meal.    donepezil (ARICEPT) 10 MG tablet Take 1 tablet (10 mg total) by mouth at bedtime.   lovastatin (MEVACOR) 20 MG tablet Take 20 mg by mouth at bedtime.   alendronate (FOSAMAX) 70 MG tablet SMARTSIG:1 Tablet(s) By Mouth (Patient not taking: Reported on 10/10/2022)   No facility-administered encounter medications on file as of 10/10/2022.       10/08/2021    4:00 PM  MMSE - Mini Mental State Exam  Orientation to time 0  Orientation to Place 4  Registration 3  Attention/ Calculation 4  Recall 0  Language- name 2 objects  2  Language- repeat 1  Language- follow 3 step command 2  Language- read & follow direction 1  Write a sentence 0  Copy design 0  Total score 17      08/02/2018   11:00 AM  Montreal Cognitive Assessment   Visuospatial/ Executive (0/5) 5  Naming (0/3) 3  Attention: Read list of digits (0/2) 2  Attention: Read list of letters (0/1) 1  Attention: Serial 7 subtraction starting at 100 (0/3) 3  Language: Repeat phrase (0/2) 2  Language : Fluency (0/1) 0  Abstraction (0/2) 2  Delayed Recall (0/5) 3  Orientation (0/6) 6  Total 27    Objective:     PHYSICAL EXAMINATION:    VITALS:   Vitals:   10/10/22 1504  BP: (!) 146/66  Pulse: 94  Resp: 18  SpO2: 95%  Weight: 133 lb (60.3 kg)  Height: '5\' 3"'$  (1.6 m)    GEN:  The patient appears stated age and is in NAD. HEENT:  Normocephalic, atraumatic.   Neurological examination:  General: NAD, well-groomed, appears stated age. Orientation: The patient is alert. Oriented to person, not to place or date Cranial nerves: There is good facial symmetry.The speech is  not fluent but clear. No aphasia or dysarthria. Fund of knowledge is reduced. Recent and remote memory  are impaired. Attention and concentration are reduced.  Able to name objects and repeat phrases.  Hearing is intact to conversational tone.    Sensation: Sensation is intact to light touch throughout Motor: Strength is at least antigravity x4. Tremors: none  DTR's 2/4 in UE/LE     Movement examination: Tone: There is normal tone in the UE/LE Abnormal movements:  no tremor.  No myoclonus.  No asterixis.   Coordination:  There is no decremation with RAM's. Normal finger to nose  Gait and Station: The patient has no difficulty arising out of a deep-seated chair without the use of the hands. The patient's stride length is good.  Gait is cautious and narrow.    Thank you for allowing [place] the opportunity to participate in the care of this nice patient. Please do not hesitate to contact us for any questions or concerns.   Total time spent on today's visit was 34 minutes dedicated to this patient today, preparing to see patient, examining the patient, ordering tests and/or medications and counseling the patient, documenting clinical information in the EHR or other health record, independently interpreting results and communicating results to the patient/family, discussing treatment and goals, answering patient's questions and coordinating care.  Cc:  Wenda Low, MD  Sharene Butters 10/10/2022 3:37 PM

## 2022-10-10 NOTE — Patient Instructions (Addendum)
It was a pleasure to see you today at our office.   Recommendations:  Follow up in  6 months Continue donepezil 10 mg daily. Side effects were discussed  Recommend referral to Speech therapy    Whom to call:  Memory  decline, memory medications: Call our office [phone]   For psychiatric meds, mood meds: Please have your primary care physician manage these medications.      For assessment of decision of mental capacity and competency:  Call Dr. Anthoney Harada, geriatric psychiatrist at [phone]  For guidance in geriatric dementia issues please call [facility] [phone]  For guidance regarding [facility] and if placement were needed at the facility, contact Arnell Asal, Social Worker tel: [phone]  If you have any severe symptoms of a stroke, or other severe issues such as confusion,severe chills or fever, etc call 911 or go to the ER as you may need to be evaluated further   Feel free to visit Facebook page " [email or web address]" for tips of how to care for people with memory problems.         RECOMMENDATIONS FOR ALL PATIENTS WITH MEMORY PROBLEMS: 1. Continue to exercise (Recommend 30 minutes of walking everyday, or 3 hours every week) 2. Increase social interactions - continue going to [place] and enjoy social gatherings with friends and family 3. Eat healthy, avoid fried foods and eat more fruits and vegetables 4. Maintain adequate blood pressure, blood sugar, and blood cholesterol level. Reducing the risk of stroke and cardiovascular disease also helps promoting better memory. 5. Avoid stressful situations. Live a simple life and avoid aggravations. Organize your time and prepare for the next day in anticipation. 6. Sleep well, avoid any interruptions of sleep and avoid any distractions in the bedroom that may interfere with adequate sleep quality 7. Avoid sugar, avoid sweets as there is a strong link between excessive sugar intake,  diabetes, and cognitive impairment We discussed the Mediterranean diet, which has been shown to help patients reduce the risk of progressive memory disorders and reduces cardiovascular risk. This includes eating fish, eat fruits and green leafy vegetables, nuts like almonds and hazelnuts, walnuts, and also use olive oil. Avoid fast foods and fried foods as much as possible. Avoid sweets and sugar as sugar use has been linked to worsening of memory function.  There is always a concern of gradual progression of memory problems. If this is the case, then we may need to adjust level of care according to patient needs. Support, both to the patient and caregiver, should then be put into place.    The Alzheimer's Association is here all day, every day for people facing Alzheimer's disease through our free 24/7 Helpline: 5020350390. The Helpline provides reliable information and support to all those who need assistance, such as individuals living with memory loss, Alzheimer's or other dementia, caregivers, health care professionals and the public.  Our highly trained and knowledgeable staff can help you with: Understanding memory loss, dementia and Alzheimer's  Medications and other treatment options  General information about aging and brain health  Skills to provide quality care and to find the best care from professionals  Legal, financial and living-arrangement decisions Our Helpline also features: Confidential care consultation provided by master's level clinicians who can help with decision-making support, crisis assistance and education on issues families face every day  Help in a caller's preferred language using our translation service that features more than 200 languages and dialects  Referrals to local  community programs, services and ongoing support     FALL PRECAUTIONS: Be cautious when walking. Scan the area for obstacles that may increase the risk of trips and falls. When getting up in  the mornings, sit up at the edge of the bed for a few minutes before getting out of bed. Consider elevating the bed at the head end to avoid drop of blood pressure when getting up. Walk always in a well-lit room (use night lights in the walls). Avoid area rugs or power cords from appliances in the middle of the walkways. Use a walker or a cane if necessary and consider physical therapy for balance exercise. Get your eyesight checked regularly.  FINANCIAL OVERSIGHT: Supervision, especially oversight when making financial decisions or transactions is also recommended.  HOME SAFETY: Consider the safety of the kitchen when operating appliances like stoves, microwave oven, and blender. Consider having supervision and share cooking responsibilities until no longer able to participate in those. Accidents with firearms and other hazards in the house should be identified and addressed as well.   ABILITY TO BE LEFT ALONE: If patient is unable to contact 911 operator, consider using LifeLine, or when the need is there, arrange for someone to stay with patients. Smoking is a fire hazard, consider supervision or cessation. Risk of wandering should be assessed by caregiver and if detected at any point, supervision and safe proof recommendations should be instituted.  MEDICATION SUPERVISION: Inability to self-administer medication needs to be constantly addressed. Implement a mechanism to ensure safe administration of the medications.   DRIVING: Regarding driving, in patients with progressive memory problems, driving will be impaired. We advise to have someone else do the driving if trouble finding directions or if minor accidents are reported. Independent driving assessment is available to determine safety of driving.   If you are interested in the driving assessment, you can contact the following:  [employer] in [place]  [place] [phone] or [phone]      [place] refers to food and lifestyle choices that are based on the traditions of countries located on the [employer]. This way of eating has been shown to help prevent certain conditions and improve outcomes for people who have chronic diseases, like kidney disease and heart disease. What are tips for following this plan? Lifestyle  Cook and eat meals together with your family, when possible. Drink enough fluid to keep your urine clear or pale yellow. Be physically active every day. This includes: Aerobic exercise like running or swimming. Leisure activities like gardening, walking, or housework. Get 7-8 hours of sleep each night. If recommended by your health care provider, drink red wine in moderation. This means 1 glass a day for nonpregnant women and 2 glasses a day for men. A glass of wine equals 5 oz (150 mL). Reading food labels  Check the serving size of packaged foods. For foods such as rice and pasta, the serving size refers to the amount of cooked product, not dry. Check the total fat in packaged foods. Avoid foods that have saturated fat or trans fats. Check the ingredients list for added sugars, such as corn syrup. Shopping  At the grocery store, buy most of your food from the areas near the walls of the store. This includes: Fresh fruits and vegetables (produce). Grains, beans, nuts, and seeds. Some of these may be available in unpackaged forms or large amounts (  in bulk). Fresh seafood. Poultry and eggs. Low-fat dairy products. Buy whole ingredients instead of prepackaged foods. Buy fresh fruits and vegetables in-season from local farmers markets. Buy frozen fruits and vegetables in resealable bags. If you do not have access to quality fresh seafood, buy precooked frozen shrimp or canned fish, such as tuna, salmon, or sardines. Buy small amounts of raw or  cooked vegetables, salads, or olives from the deli or salad bar at your store. Stock your pantry so you always have certain foods on hand, such as olive oil, canned tuna, canned tomatoes, rice, pasta, and beans. Cooking  Cook foods with extra-virgin olive oil instead of using butter or other vegetable oils. Have meat as a side dish, and have vegetables or grains as your main dish. This means having meat in small portions or adding small amounts of meat to foods like pasta or stew. Use beans or vegetables instead of meat in common dishes like chili or lasagna. Experiment with different cooking methods. Try roasting or broiling vegetables instead of steaming or sauteing them. Add frozen vegetables to soups, stews, pasta, or rice. Add nuts or seeds for added healthy fat at each meal. You can add these to yogurt, salads, or vegetable dishes. Marinate fish or vegetables using olive oil, lemon juice, garlic, and fresh herbs. Meal planning  Plan to eat 1 vegetarian meal one day each week. Try to work up to 2 vegetarian meals, if possible. Eat seafood 2 or more times a week. Have healthy snacks readily available, such as: Vegetable sticks with hummus. Greek yogurt. Fruit and nut trail mix. Eat balanced meals throughout the week. This includes: Fruit: 2-3 servings a day Vegetables: 4-5 servings a day Low-fat dairy: 2 servings a day Fish, poultry, or lean meat: 1 serving a day Beans and legumes: 2 or more servings a week Nuts and seeds: 1-2 servings a day Whole grains: 6-8 servings a day Extra-virgin olive oil: 3-4 servings a day Limit red meat and sweets to only a few servings a month What are my food choices? Mediterranean diet Recommended Grains: Whole-grain pasta. Brown rice. Bulgar wheat. Polenta. Couscous. Whole-wheat bread. [place]. Vegetables: Artichokes. Beets. Broccoli. Cabbage. Carrots. Eggplant. Green beans. Chard. Kale. Spinach. Onions. Leeks. Peas. Squash. Tomatoes.  Peppers. Radishes. Fruits: Apples. Apricots. Avocado. Berries. Bananas. Cherries. Dates. Figs. Grapes. Lemons. Melon. Oranges. Peaches. Plums. Pomegranate. Meats and other protein foods: Beans. Almonds. Sunflower seeds. Pine nuts. Peanuts. [place]. Salmon. Scallops. Shrimp. [place]. Tilapia. Clams. Oysters. Eggs. Dairy: Low-fat milk. Cheese. Greek yogurt. Beverages: Water. Red wine. Herbal tea. Fats and oils: Extra virgin olive oil. Avocado oil. Grape seed oil. Sweets and desserts: [place] yogurt with honey. Baked apples. Poached pears. Trail mix. Seasoning and other foods: Basil. Cilantro. Coriander. Cumin. Mint. Parsley. Sage. Rosemary. Tarragon. Garlic. Oregano. Thyme. Pepper. Balsalmic vinegar. Tahini. Hummus. Tomato sauce. Olives. Mushrooms. Limit these Grains: Prepackaged pasta or rice dishes. Prepackaged cereal with added sugar. Vegetables: Deep fried potatoes (french fries). Fruits: Fruit canned in syrup. Meats and other protein foods: Beef. Pork. Lamb. Poultry with skin. Hot dogs. [place] Salines. Dairy: Ice cream. Sour cream. Whole milk. Beverages: Juice. Sugar-sweetened soft drinks. Beer. Liquor and spirits. Fats and oils: Butter. Canola oil. Vegetable oil. Beef fat (tallow). Lard. Sweets and desserts: Cookies. Cakes. Pies. Candy. Seasoning and other foods: Mayonnaise. Premade sauces and marinades. The items listed may not be a complete list. Talk with your dietitian about what dietary choices are right for you. Summary The Mediterranean diet includes both food and lifestyle  choices. Eat a variety of fresh fruits and vegetables, beans, nuts, seeds, and whole grains. Limit the amount of red meat and sweets that you eat. Talk with your health care provider about whether it is safe for you to drink red wine in moderation. This means 1 glass a day for nonpregnant women and 2 glasses a day for men. A glass of wine equals 5 oz (150 mL). This information is not intended to replace advice given to you by  your health care provider. Make sure you discuss any questions you have with your health care provider. Document Released: 07/28/2016 Document Revised: 08/30/2016 Document Reviewed: 07/28/2016 Elsevier Interactive Patient Education  2017 [employer].

## 2022-10-20 ENCOUNTER — Ambulatory Visit: Payer: Medicare Other | Attending: Physician Assistant | Admitting: Speech Pathology

## 2022-10-20 ENCOUNTER — Encounter: Payer: Self-pay | Admitting: Speech Pathology

## 2022-10-20 DIAGNOSIS — R41841 Cognitive communication deficit: Secondary | ICD-10-CM | POA: Insufficient documentation

## 2022-10-20 DIAGNOSIS — R1310 Dysphagia, unspecified: Secondary | ICD-10-CM | POA: Insufficient documentation

## 2022-10-20 NOTE — Therapy (Signed)
OUTPATIENT SPEECH LANGUAGE PATHOLOGY EVALUATION   Patient Name: Katie Jackson MRN: [id number] DOB:September 04, 1946, 76 y.o., female Today's Date: 10/21/2022  PCP: Wenda Low, MD REFERRING PROVIDER: Rondel Jumbo, PA-C   End of Session - 10/21/22 0805     Visit Number 1    Number of Visits 1    SLP Start Time 1535    SLP Stop Time  1625    SLP Time Calculation (min) 50 min    Activity Tolerance Patient tolerated treatment well             Past Medical History:  Diagnosis Date   High cholesterol    Hypertension    Past Surgical History:  Procedure Laterality Date   BREAST BIOPSY     cataract Bilateral    Patient Active Problem List   Diagnosis Date Noted   Dementia with behavioral disturbance ([place]) 02/28/2020   Pressure injury of skin 02/26/2020   AKI (acute kidney injury) ([place]) 02/25/2020    ONSET DATE: dx with dementia 2021; referral received  10/10/22  REFERRING DIAG: R41.3 (ICD-10-CM) - Memory loss  THERAPY DIAG:  Cognitive communication deficit  Dysphagia, unspecified type  Rationale for Evaluation and Treatment: Habilitation  SUBJECTIVE:   SUBJECTIVE STATEMENT: Katie Jackson attends session with granddaughter, Roselyn Reef. Report difficulty swallowing food, occasional difficulty swallowing pills. Pt will pat her chest and clear throat occasionally when eating.  Pt accompanied by: family member  PERTINENT HISTORY: Pt with dx of dementia, always with caregivers. Difficulty with solids and meds ongoing for few months.   PAIN:  Are you having pain? No  FALLS: Has patient fallen in last 6 months?  No  LIVING ENVIRONMENT: Lives with: lives with their family Lives in: House/apartment  PLOF:  Level of assistance: Needed assistance with ADLs Employment: Retired  PATIENT GOALS: for Katie Jackson to be able to keep eating preferred foods   OBJECTIVE:   COGNITION: Overall cognitive status: History of cognitive impairments - at baseline Areas of impairment:   Pt dx with Alzheimer's  Functional deficits: total A for ADLs  COGNITIVE COMMUNICATION: Auditory comprehension: WFL for simple conversation re: swallowing and home environment this date Verbal expression: Impaired: granddaughter reports increasing anomia which results in frustration (evidenced by increased cursing)  Functional communication: Impaired: in presence of progressive neurodegenerative disease  ORAL MOTOR EXAMINATION: Overall status: Did not assess Comments: attempted to assess, however limited d/t pt with difficulty following directions and sequencing even with direct model. Is able to open mouth wide, stick tongue out (symmetrical) and to sides.   CLINICAL SWALLOW ASSESSMENT:   Current diet: regular and thin liquids Dentition: dentures top and bottom; bottom dentures noted to not be fixed and with movement in mouth during PO and opening of jaw Patient directly observed with POs: Yes: regular, dysphagia 1 (puree), thin liquids, and mixed consistencies    Feeding: able to feed self Liquids provided by: cup Oral phase signs and symptoms: prolonged mastication Pharyngeal phase signs and symptoms: none evidenced this date Comments: pt observed to take small bites and sips, occasional use of liquid wash. Takes subsequent bite before swallowing initial bite. Responds to mod verbal cues for decreased rate and use of liquid wash.   TODAY'S TREATMENT:  10-21-2022: SLP provided education on evaluation observations, common swallowing and language problems associated with dementia. Discussion on options for therapy, with most benefit likely coming from modifications and strategies which care givers can use to maximize oral PO safety and efficacy and support pt's declining verbal expression ability.  Discussion re: limitations of exercises or structured tasks ability to rehabilitate in presence of dementia. Pt's granddaughter feels comfortable implementing strategies and compensations discussed  during today's session to support Frankie, decline MBSS d/t overall her "not being that bad," and strong preference that diet changes not be made at this time to preserve QoL. SLP recommends alternative pill administration options. Meal time modifications discussed were: avoid distractions when eating, frequent verbal cues for small bites, slowed rate, swallow prior to new bite, use of occasional liquid wash. May wish to consider visiting dentist to see about better fitting lower dentures. Communication strategies discussed included use of memory book, providing options, use of questions to aid in anomia recovery, use of confirmation to ensure message is understood. Provided resources to granddaughter and advised to return with new referral should needs change.   PATIENT EDUCATION: Education details: see above Person educated: Patient and Caregiver Therapist, art) Education method: Explanation, Demonstration, Verbal cues, and Handouts Education comprehension: verbalized understanding, returned demonstration, and verbal cues required   ASSESSMENT:  CLINICAL IMPRESSION: Katie Jackson was seen today for a ST evaluation for "choking" episodes and reported increased instances of anomia. Clinical swallow evaluation completed with no overt s/sx of aspiration evidenced. Language within gross functional limits for today's conversation. Therapy is not indicated at this time, though strategies and resources were provided to caregiver to optimize ongoing PO of preferred diet and supporting communication in home environment.   OBJECTIVE IMPAIRMENTS: include attention, memory, awareness, executive functioning, expressive language, and dysphagia. These impairments are limiting patient from effectively communicating at home and in community and safety when swallowing. Factors affecting potential to achieve goals and functional outcome are ability to learn/carryover information, cooperation/participation level, medical prognosis,  and previous level of function.. Patient will benefit from skilled SLP services to address above impairments and improve overall function.  PLAN:  SLP FREQUENCY: one time visit   Su Monks, [place] 10/21/2022, 8:06 AM

## 2022-10-20 NOTE — Patient Instructions (Addendum)
Pills: Verbal cueing for using liquid wash Trying with pudding or applesauce (sometimes thicker ocnsistancy can help move pills down Talk to doctor/pharmacist about modifying medications Crushing in applesauce Liquid options   [facility] on Instagram  A Caregiver's Guide to Dementia: Using Activities and Other Strategies to Prevent, Reduce and Manage Behavioral Symptoms by Osie Bond. [place] and [employer]   The 36 hour day by [employer] adult daycare  Look into home health aid: [phone]

## 2022-10-21 ENCOUNTER — Other Ambulatory Visit: Payer: Self-pay

## 2022-10-24 DIAGNOSIS — R350 Frequency of micturition: Secondary | ICD-10-CM | POA: Diagnosis not present

## 2022-11-17 DIAGNOSIS — B079 Viral wart, unspecified: Secondary | ICD-10-CM | POA: Diagnosis not present

## 2022-11-17 DIAGNOSIS — R222 Localized swelling, mass and lump, trunk: Secondary | ICD-10-CM | POA: Diagnosis not present

## 2022-12-06 ENCOUNTER — Other Ambulatory Visit: Payer: Self-pay | Admitting: Internal Medicine

## 2022-12-06 DIAGNOSIS — R222 Localized swelling, mass and lump, trunk: Secondary | ICD-10-CM

## 2022-12-20 DIAGNOSIS — E782 Mixed hyperlipidemia: Secondary | ICD-10-CM | POA: Diagnosis not present

## 2022-12-20 DIAGNOSIS — L989 Disorder of the skin and subcutaneous tissue, unspecified: Secondary | ICD-10-CM | POA: Diagnosis not present

## 2022-12-20 DIAGNOSIS — J449 Chronic obstructive pulmonary disease, unspecified: Secondary | ICD-10-CM | POA: Diagnosis not present

## 2022-12-20 DIAGNOSIS — N183 Chronic kidney disease, stage 3 unspecified: Secondary | ICD-10-CM | POA: Diagnosis not present

## 2022-12-20 DIAGNOSIS — R7303 Prediabetes: Secondary | ICD-10-CM | POA: Diagnosis not present

## 2022-12-20 DIAGNOSIS — M810 Age-related osteoporosis without current pathological fracture: Secondary | ICD-10-CM | POA: Diagnosis not present

## 2022-12-20 DIAGNOSIS — I10 Essential (primary) hypertension: Secondary | ICD-10-CM | POA: Diagnosis not present

## 2022-12-22 ENCOUNTER — Ambulatory Visit
Admission: RE | Admit: 2022-12-22 | Discharge: 2022-12-22 | Disposition: A | Discharge status: Home / Self Care | Payer: Medicare Other | Source: Ambulatory Visit | Attending: Internal Medicine | Admitting: Internal Medicine

## 2022-12-22 DIAGNOSIS — R222 Localized swelling, mass and lump, trunk: Secondary | ICD-10-CM

## 2022-12-28 ENCOUNTER — Other Ambulatory Visit: Payer: Self-pay | Admitting: Internal Medicine

## 2022-12-28 DIAGNOSIS — R222 Localized swelling, mass and lump, trunk: Secondary | ICD-10-CM

## 2023-01-04 ENCOUNTER — Other Ambulatory Visit (HOSPITAL_COMMUNITY): Payer: Self-pay | Admitting: Internal Medicine

## 2023-01-04 DIAGNOSIS — R222 Localized swelling, mass and lump, trunk: Secondary | ICD-10-CM

## 2023-01-17 DIAGNOSIS — C4402 Squamous cell carcinoma of skin of lip: Secondary | ICD-10-CM | POA: Diagnosis not present

## 2023-01-24 ENCOUNTER — Ambulatory Visit (HOSPITAL_COMMUNITY)
Admission: RE | Admit: 2023-01-24 | Discharge: 2023-01-24 | Disposition: A | Discharge status: Home / Self Care | Payer: 59 | Source: Ambulatory Visit | Attending: Internal Medicine | Admitting: Internal Medicine

## 2023-01-24 ENCOUNTER — Encounter (HOSPITAL_COMMUNITY): Payer: Self-pay

## 2023-01-24 DIAGNOSIS — R222 Localized swelling, mass and lump, trunk: Secondary | ICD-10-CM | POA: Insufficient documentation

## 2023-01-27 ENCOUNTER — Other Ambulatory Visit (HOSPITAL_COMMUNITY): Payer: Self-pay | Admitting: Internal Medicine

## 2023-01-27 DIAGNOSIS — R222 Localized swelling, mass and lump, trunk: Secondary | ICD-10-CM

## 2023-02-21 ENCOUNTER — Encounter (HOSPITAL_COMMUNITY): Payer: Self-pay | Admitting: *Deleted

## 2023-02-21 DIAGNOSIS — C4402 Squamous cell carcinoma of skin of lip: Secondary | ICD-10-CM | POA: Diagnosis not present

## 2023-02-21 DIAGNOSIS — D485 Neoplasm of uncertain behavior of skin: Secondary | ICD-10-CM | POA: Diagnosis not present

## 2023-02-21 NOTE — Progress Notes (Signed)
Internal Med/PCP - Dr Wenda Low Cardiologist - n/a  Chest x-ray - n/a EKG - DOS Stress Test - n/a ECHO - n/a Cardiac Cath - n/a  ICD Pacemaker/Loop - n/a  Sleep Study -  n/a CPAP - none  Diabetes n/a  NPO   Anesthesia review: No  STOP now taking any Aspirin (unless otherwise instructed by your surgeon), Aleve, Naproxen, Ibuprofen, Motrin, Advil, Goody's, BC's, all herbal medications, fish oil, and all vitamins.   Coronavirus Screening Does the patient have any of the following symptoms:  Cough yes/no: No Fever (>100.19F)  yes/no: No Runny nose yes/no: No Sore throat yes/no: No Difficulty breathing/shortness of breath  yes/no: No  Has the patient traveled in the last 14 days and where? yes/no: No  Patient's Daughter Trina Ao verbalized understanding of instructions that were given via phone.

## 2023-02-23 ENCOUNTER — Ambulatory Visit (HOSPITAL_COMMUNITY): Payer: 59

## 2023-02-23 DIAGNOSIS — M810 Age-related osteoporosis without current pathological fracture: Secondary | ICD-10-CM | POA: Diagnosis not present

## 2023-02-23 DIAGNOSIS — Z87891 Personal history of nicotine dependence: Secondary | ICD-10-CM | POA: Diagnosis not present

## 2023-02-23 DIAGNOSIS — F05 Delirium due to known physiological condition: Secondary | ICD-10-CM | POA: Diagnosis not present

## 2023-02-23 DIAGNOSIS — N3020 Other chronic cystitis without hematuria: Secondary | ICD-10-CM | POA: Diagnosis not present

## 2023-02-23 DIAGNOSIS — J449 Chronic obstructive pulmonary disease, unspecified: Secondary | ICD-10-CM | POA: Diagnosis not present

## 2023-02-23 DIAGNOSIS — R222 Localized swelling, mass and lump, trunk: Secondary | ICD-10-CM | POA: Diagnosis not present

## 2023-02-23 DIAGNOSIS — E45 Retarded development following protein-calorie malnutrition: Secondary | ICD-10-CM | POA: Diagnosis not present

## 2023-02-23 DIAGNOSIS — M25451 Effusion, right hip: Secondary | ICD-10-CM | POA: Diagnosis not present

## 2023-02-23 DIAGNOSIS — N1831 Chronic kidney disease, stage 3a: Secondary | ICD-10-CM | POA: Diagnosis not present

## 2023-02-23 DIAGNOSIS — F0390 Unspecified dementia without behavioral disturbance: Secondary | ICD-10-CM | POA: Diagnosis not present

## 2023-02-23 DIAGNOSIS — E782 Mixed hyperlipidemia: Secondary | ICD-10-CM | POA: Diagnosis not present

## 2023-02-23 DIAGNOSIS — M1611 Unilateral primary osteoarthritis, right hip: Secondary | ICD-10-CM | POA: Diagnosis not present

## 2023-02-23 DIAGNOSIS — I129 Hypertensive chronic kidney disease with stage 1 through stage 4 chronic kidney disease, or unspecified chronic kidney disease: Secondary | ICD-10-CM | POA: Diagnosis not present

## 2023-02-23 DIAGNOSIS — R7303 Prediabetes: Secondary | ICD-10-CM | POA: Diagnosis not present

## 2023-03-20 DIAGNOSIS — M810 Age-related osteoporosis without current pathological fracture: Secondary | ICD-10-CM | POA: Diagnosis not present

## 2023-03-20 DIAGNOSIS — I10 Essential (primary) hypertension: Secondary | ICD-10-CM | POA: Diagnosis not present

## 2023-03-20 DIAGNOSIS — N1831 Chronic kidney disease, stage 3a: Secondary | ICD-10-CM | POA: Diagnosis not present

## 2023-03-20 DIAGNOSIS — R229 Localized swelling, mass and lump, unspecified: Secondary | ICD-10-CM | POA: Diagnosis not present

## 2023-03-20 DIAGNOSIS — E782 Mixed hyperlipidemia: Secondary | ICD-10-CM | POA: Diagnosis not present

## 2023-03-20 DIAGNOSIS — E46 Unspecified protein-calorie malnutrition: Secondary | ICD-10-CM | POA: Diagnosis not present

## 2023-03-20 DIAGNOSIS — Z0000 Encounter for general adult medical examination without abnormal findings: Secondary | ICD-10-CM | POA: Diagnosis not present

## 2023-03-20 DIAGNOSIS — J449 Chronic obstructive pulmonary disease, unspecified: Secondary | ICD-10-CM | POA: Diagnosis not present

## 2023-03-20 DIAGNOSIS — N3020 Other chronic cystitis without hematuria: Secondary | ICD-10-CM | POA: Diagnosis not present

## 2023-03-20 DIAGNOSIS — R7303 Prediabetes: Secondary | ICD-10-CM | POA: Diagnosis not present

## 2023-03-21 DIAGNOSIS — Z85828 Personal history of other malignant neoplasm of skin: Secondary | ICD-10-CM | POA: Diagnosis not present

## 2023-03-21 DIAGNOSIS — Z08 Encounter for follow-up examination after completed treatment for malignant neoplasm: Secondary | ICD-10-CM | POA: Diagnosis not present

## 2023-04-11 ENCOUNTER — Encounter: Payer: Self-pay | Admitting: Physician Assistant

## 2023-04-11 ENCOUNTER — Ambulatory Visit (INDEPENDENT_AMBULATORY_CARE_PROVIDER_SITE_OTHER): Payer: 59 | Admitting: Physician Assistant

## 2023-04-11 VITALS — Resp 18 | Ht 63.0 in | Wt 133.0 lb

## 2023-04-11 DIAGNOSIS — F02818 Dementia in other diseases classified elsewhere, unspecified severity, with other behavioral disturbance: Secondary | ICD-10-CM

## 2023-04-11 DIAGNOSIS — G301 Alzheimer's disease with late onset: Secondary | ICD-10-CM

## 2023-04-11 NOTE — Patient Instructions (Signed)
It was a pleasure to see you today at our office.   Recommendations:  Follow up in  6 months Continue donepezil 10 mg daily. Side effects were discussed   Agree with memory care   Whom to call:  Memory  decline, memory medications: Call our office [phone]   For psychiatric meds, mood meds: Please have your primary care physician manage these medications.      For assessment of decision of mental capacity and competency:  Call Dr. Erick Blinks, geriatric psychiatrist at [phone]  For guidance in geriatric dementia issues please call [facility] [phone]  For guidance regarding [facility] and if placement were needed at the facility, contact Sidney Ace, Social Worker tel: [phone]  If you have any severe symptoms of a stroke, or other severe issues such as confusion,severe chills or fever, etc call 911 or go to the ER as you may need to be evaluated further   Feel free to visit Facebook page " [email or web address]" for tips of how to care for people with memory problems.         RECOMMENDATIONS FOR ALL PATIENTS WITH MEMORY PROBLEMS: 1. Continue to exercise (Recommend 30 minutes of walking everyday, or 3 hours every week) 2. Increase social interactions - continue going to [place] and enjoy social gatherings with friends and family 3. Eat healthy, avoid fried foods and eat more fruits and vegetables 4. Maintain adequate blood pressure, blood sugar, and blood cholesterol level. Reducing the risk of stroke and cardiovascular disease also helps promoting better memory. 5. Avoid stressful situations. Live a simple life and avoid aggravations. Organize your time and prepare for the next day in anticipation. 6. Sleep well, avoid any interruptions of sleep and avoid any distractions in the bedroom that may interfere with adequate sleep quality 7. Avoid sugar, avoid sweets as there is a strong link between excessive sugar intake, diabetes, and  cognitive impairment We discussed the Mediterranean diet, which has been shown to help patients reduce the risk of progressive memory disorders and reduces cardiovascular risk. This includes eating fish, eat fruits and green leafy vegetables, nuts like almonds and hazelnuts, walnuts, and also use olive oil. Avoid fast foods and fried foods as much as possible. Avoid sweets and sugar as sugar use has been linked to worsening of memory function.  There is always a concern of gradual progression of memory problems. If this is the case, then we may need to adjust level of care according to patient needs. Support, both to the patient and caregiver, should then be put into place.    The Alzheimer's Association is here all day, every day for people facing Alzheimer's disease through our free 24/7 Helpline: [phone]. The Helpline provides reliable information and support to all those who need assistance, such as individuals living with memory loss, Alzheimer's or other dementia, caregivers, health care professionals and the public.  Our highly trained and knowledgeable staff can help you with: Understanding memory loss, dementia and Alzheimer's  Medications and other treatment options  General information about aging and brain health  Skills to provide quality care and to find the best care from professionals  Legal, financial and living-arrangement decisions Our Helpline also features: Confidential care consultation provided by master's level clinicians who can help with decision-making support, crisis assistance and education on issues families face every day  Help in a caller's preferred language using our translation service that features more than 200 languages and dialects  Referrals to local community  programs, services and ongoing support     FALL PRECAUTIONS: Be cautious when walking. Scan the area for obstacles that may increase the risk of trips and falls. When getting up in the mornings,  sit up at the edge of the bed for a few minutes before getting out of bed. Consider elevating the bed at the head end to avoid drop of blood pressure when getting up. Walk always in a well-lit room (use night lights in the walls). Avoid area rugs or power cords from appliances in the middle of the walkways. Use a walker or a cane if necessary and consider physical therapy for balance exercise. Get your eyesight checked regularly.  FINANCIAL OVERSIGHT: Supervision, especially oversight when making financial decisions or transactions is also recommended.  HOME SAFETY: Consider the safety of the kitchen when operating appliances like stoves, microwave oven, and blender. Consider having supervision and share cooking responsibilities until no longer able to participate in those. Accidents with firearms and other hazards in the house should be identified and addressed as well.   ABILITY TO BE LEFT ALONE: If patient is unable to contact 911 operator, consider using LifeLine, or when the need is there, arrange for someone to stay with patients. Smoking is a fire hazard, consider supervision or cessation. Risk of wandering should be assessed by caregiver and if detected at any point, supervision and safe proof recommendations should be instituted.  MEDICATION SUPERVISION: Inability to self-administer medication needs to be constantly addressed. Implement a mechanism to ensure safe administration of the medications.   DRIVING: Regarding driving, in patients with progressive memory problems, driving will be impaired. We advise to have someone else do the driving if trouble finding directions or if minor accidents are reported. Independent driving assessment is available to determine safety of driving.   If you are interested in the driving assessment, you can contact the following:  [employer] in [place] [phone]  [facility] [phone]  [facility]  [phone] or [phone]      Mediterranean Diet A Mediterranean diet refers to food and lifestyle choices that are based on the traditions of countries located on the [employer]. This way of eating has been shown to help prevent certain conditions and improve outcomes for people who have chronic diseases, like kidney disease and heart disease. What are tips for following this plan? Lifestyle  Cook and eat meals together with your family, when possible. Drink enough fluid to keep your urine clear or pale yellow. Be physically active every day. This includes: Aerobic exercise like running or swimming. Leisure activities like gardening, walking, or housework. Get 7-8 hours of sleep each night. If recommended by your health care provider, drink red wine in moderation. This means 1 glass a day for nonpregnant women and 2 glasses a day for men. A glass of wine equals 5 oz (150 mL). Reading food labels  Check the serving size of packaged foods. For foods such as rice and pasta, the serving size refers to the amount of cooked product, not dry. Check the total fat in packaged foods. Avoid foods that have saturated fat or trans fats. Check the ingredients list for added sugars, such as corn syrup. Shopping  At the grocery store, buy most of your food from the areas near the walls of the store. This includes: Fresh fruits and vegetables (produce). Grains, beans, nuts, and seeds. Some of these may be available in unpackaged forms or large amounts (in  bulk). Fresh seafood. Poultry and eggs. Low-fat dairy products. Buy whole ingredients instead of prepackaged foods. Buy fresh fruits and vegetables in-season from local farmers markets. Buy frozen fruits and vegetables in resealable bags. If you do not have access to quality fresh seafood, buy precooked frozen shrimp or canned fish, such as tuna, salmon, or sardines. Buy small amounts of raw or cooked  vegetables, salads, or olives from the deli or salad bar at your store. Stock your pantry so you always have certain foods on hand, such as olive oil, canned tuna, canned tomatoes, rice, pasta, and beans. Cooking  Cook foods with extra-virgin olive oil instead of using butter or other vegetable oils. Have meat as a side dish, and have vegetables or grains as your main dish. This means having meat in small portions or adding small amounts of meat to foods like pasta or stew. Use beans or vegetables instead of meat in common dishes like chili or lasagna. Experiment with different cooking methods. Try roasting or broiling vegetables instead of steaming or sauteing them. Add frozen vegetables to soups, stews, pasta, or rice. Add nuts or seeds for added healthy fat at each meal. You can add these to yogurt, salads, or vegetable dishes. Marinate fish or vegetables using olive oil, lemon juice, garlic, and fresh herbs. Meal planning  Plan to eat 1 vegetarian meal one day each week. Try to work up to 2 vegetarian meals, if possible. Eat seafood 2 or more times a week. Have healthy snacks readily available, such as: Vegetable sticks with hummus. Greek yogurt. Fruit and nut trail mix. Eat balanced meals throughout the week. This includes: Fruit: 2-3 servings a day Vegetables: 4-5 servings a day Low-fat dairy: 2 servings a day Fish, poultry, or lean meat: 1 serving a day Beans and legumes: 2 or more servings a week Nuts and seeds: 1-2 servings a day Whole grains: 6-8 servings a day Extra-virgin olive oil: 3-4 servings a day Limit red meat and sweets to only a few servings a month What are my food choices? Mediterranean diet Recommended Grains: Whole-grain pasta. Brown rice. Bulgar wheat. Polenta. Couscous. Whole-wheat bread. Orpah Cobb. Vegetables: Artichokes. Beets. Broccoli. Cabbage. Carrots. Eggplant. Green beans. Chard. Kale. Spinach. Onions. Leeks. Peas. Squash. Tomatoes. Peppers.  Radishes. Fruits: Apples. Apricots. Avocado. Berries. Bananas. Cherries. Dates. Figs. Grapes. Lemons. Melon. Oranges. Peaches. Plums. Pomegranate. Meats and other protein foods: Beans. Almonds. Sunflower seeds. Pine nuts. Peanuts. Cod. Salmon. Scallops. Shrimp. Tuna. Tilapia. Clams. Oysters. Eggs. Dairy: Low-fat milk. Cheese. Greek yogurt. Beverages: Water. Red wine. Herbal tea. Fats and oils: Extra virgin olive oil. Avocado oil. Grape seed oil. Sweets and desserts: [place] yogurt with honey. Baked apples. Poached pears. Trail mix. Seasoning and other foods: Basil. Cilantro. Coriander. Cumin. Mint. Parsley. Sage. Rosemary. Tarragon. Garlic. Oregano. Thyme. Pepper. Balsalmic vinegar. Tahini. Hummus. Tomato sauce. Olives. Mushrooms. Limit these Grains: Prepackaged pasta or rice dishes. Prepackaged cereal with added sugar. Vegetables: Deep fried potatoes (french fries). Fruits: Fruit canned in syrup. Meats and other protein foods: Beef. Pork. Lamb. Poultry with skin. Hot dogs. Tomasa Blase. Dairy: Ice cream. Sour cream. Whole milk. Beverages: Juice. Sugar-sweetened soft drinks. Beer. Liquor and spirits. Fats and oils: Butter. Canola oil. Vegetable oil. Beef fat (tallow). Lard. Sweets and desserts: Cookies. Cakes. Pies. Candy. Seasoning and other foods: Mayonnaise. Premade sauces and marinades. The items listed may not be a complete list. Talk with your dietitian about what dietary choices are right for you. Summary The Mediterranean diet includes both food and lifestyle choices.  Eat a variety of fresh fruits and vegetables, beans, nuts, seeds, and whole grains. Limit the amount of red meat and sweets that you eat. Talk with your health care provider about whether it is safe for you to drink red wine in moderation. This means 1 glass a day for nonpregnant women and 2 glasses a day for men. A glass of wine equals 5 oz (150 mL). This information is not intended to replace advice given to you by your health  care provider. Make sure you discuss any questions you have with your health care provider. Document Released: 07/28/2016 Document Revised: 08/30/2016 Document Reviewed: 07/28/2016 Elsevier Interactive Patient Education  2017 [employer].

## 2023-04-11 NOTE — Progress Notes (Signed)
Assessment/Plan:   Dementia likely due to Alzheimer' Disease with Behavioral disturbance  Katie Jackson is a very pleasant 77 y.o. RH female with a history of hypertension, hyperlipidemia, with moderate Alzheimer's dementia with behavioral changes. seen today in follow up for memory loss. She is on Donepezil 10 mg daily, however, there is significant cognitive decline since her last visit, less verbal. Family is entertaining memory care for social and cognitive stimulation as well as for safety .    Follow up in   months. Recommend good control of her cardiovascular risk factors Continue to control mood as per PCP    Subjective:    This patient is accompanied in the office by her daughter who supplements the history.  Previous records as well as any outside records available were reviewed prior to todays visit. Patient was last seen on 10/10/22 . Last MMSE was 17 on 10.2022     Any changes in memory since last visit? "Worse , cannot remember much". Sometimes cannot remember names of her family members . She no longer does crosswords or word finding. She needs more assistance than prior.  repeats oneself? Unable to carry a conversation for the last 2 months .  Her favorite words are "Damn and No " Disoriented when walking into a room? Denies, she does not leave the area Leaving objects in unusual places? " moves stuff around all the time"  Wandering behavior?  Goes outside the apartment and does not leave the area  Any personality changes since last visit?  denies  takes Seroquel for sundown's, sleeps during the day  Any worsening depression?:  may get aggressive at times," trying to hit you sometimes" Hallucinations or paranoia?  denies   Seizures?    denies    Any sleep changes? Sleeps better during the day than at night. Denies vivid dreams, REM behavior or sleepwalking   Sleep apnea?   denies   Any hygiene concerns? "you got to fight to make her take a bath"  Independent  of bathing and dressing?  Daughter has to choose the clothing for her  Does the patient needs help with medications? Daughter is in charge   Who is in charge of the finances?  Daughter is in charge     Any changes in appetite?  denies     Patient have trouble swallowing?  denies   Does the patient cook? No Any headaches?   denies   Chronic back pain  denies   Ambulates with difficulty?  Denies   Recent falls or head injuries? denies     Unilateral weakness, numbness or tingling? denies   Any tremors?  denies   Any anosmia?  Patient denies   Any incontinence of urine? Uses pull ups due to incontinence Any bowel dysfunction?     denies      Patient lives   considering memory care Does the patient drive?no longer drives   History on Initial Assessment 08/02/2018: This is a 77 year old right-handed woman with a history of hypertension, hyperlipidemia, presenting for evaluation of dementia. She thinks her memory is "pretty well." Her daughter started noticing memory changes a couple of years ago, worse the past year. She lives alone in a senior citizen apartment complex. Her daughter reports difficulties with medications. She was having recurrent UTIs, she said she was taking them, but when she had another UTI in June 2019, her daughter found a full bottle of antibiotic from December 2018. Her daughter started fixing her  pillbox but she still does not take it right. Her daughter reports that she has been a victim of several scams, she has joined [facility] and does not know what the policy is for. She joined [employer], her daughter discontinued the membership, then she joined again. She was forgetting to pay her bills and then overpaying 2 months ago. She has mail everywhere. Her daughter has been trying to help her for the past 2-3 months but she would fight her daughter. Her daughter is now POA. She does not cook and goes out twice a day to buy food. She continues to drive and denies getting lost driving.  She drives for her neighbors and denies any car accidents. 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 She states she is not doing this anymore. She is independent with dressing and bathing but she kept having a rash on her face, which they think is due to not bathing and washing her hair. She hangs dirty clothes back in the closet. She has been putting on her eyeliner thickly on her lower lid for the past 2 years, which her family reports is a change. Her daughter has also noticed increased irritability, she can get agitated/aggressive quickly, which is not typical for her. No paranoia or hallucinations. No family history of dementia. She denies any history of significant head injuries or alcohol use. She is taking Donepezil  daily without side effects. She denies any headaches, dizziness, diplopia, dysarthria, dysphagia, neck/back pain, focal numbness/tingling/weakness, bowel/bladder dysfunction. No anosmia, tremors, no falls.    She had a head CT without contrast done 04/2018 which did not show any acute changes. There was mild diffuse atrophy and bilateral hippocampal atrophy noted.   PREVIOUS MEDICATIONS:   CURRENT MEDICATIONS:  Outpatient Encounter Medications as of 04/11/2023  Medication Sig   amLODipine (NORVASC) 10 MG tablet Take 1 tablet (10 mg total) by mouth daily.   Calcium Carb-Cholecalciferol (CALCIUM 600/VITAMIN D3 PO) Take 1 tablet by mouth 2 (two) times daily with a meal.    donepezil (ARICEPT) 10 MG tablet Take 1 tablet (10 mg total) by mouth at bedtime.   lovastatin (MEVACOR) 20 MG tablet Take 20 mg by mouth at bedtime.   No facility-administered encounter medications on file as of 04/11/2023.       10/08/2021    4:00 PM  MMSE - Mini Mental State Exam  Orientation to time 0  Orientation to Place 4  Registration 3  Attention/ Calculation 4  Recall 0  Language- name 2  objects 2  Language- repeat 1  Language- follow 3 step command 2  Language- read & follow direction 1  Write a sentence 0  Copy design 0  Total score 17      08/02/2018   11:00 AM  Montreal Cognitive Assessment   Visuospatial/ Executive (0/5) 5  Naming (0/3) 3  Attention: Read list of digits (0/2) 2  Attention: Read list of letters (0/1) 1  Attention: Serial 7 subtraction starting at 100 (0/3) 3  Language: Repeat phrase (0/2) 2  Language : Fluency (0/1) 0  Abstraction (0/2) 2  Delayed Recall (0/5) 3  Orientation (0/6) 6  Total 27    Objective:     PHYSICAL EXAMINATION:    VITALS:   Vitals:   04/11/23 1540  Resp: 18  Weight: 133 lb (60.3 kg)  Height:  (  1.6 m)    GEN:  The patient appears stated age and is in NAD. HEENT:  Normocephalic, atraumatic.   Neurological examination:  General: NAD, well-groomed, appears stated age. Orientation: The patient is alert. Not  Oriented to person, place or date Cranial nerves: There is good facial symmetry.The speech is non fluent but clear. No aphasia or dysarthria. Fund of knowledge is reducedRecent and remote memory are impaired. Attention and concentration are reduced.  Unable to name objects and repeat phrases.  Hearing is intact to conversational tone.   Sensation: Sensation is intact to light touch throughout Motor: Strength is at least antigravity x4. DTR's 2/4 in UE/LE     Movement examination: Tone: There is normal tone in the UE/LE Abnormal movements:  no tremor.  No myoclonus.  No asterixis.   Coordination:  There is  decremation with RAM's and  FNT.   Gait and Station: The patient has no difficulty arising out of a deep-seated chair without the use of the hands. The patient's stride length is good.  Gait is cautious and narrow.    Thank you for allowing [place] the opportunity to participate in the care of this nice patient. Please do not hesitate to contact us for any questions or concerns.   Total time spent  on today's visit was 22 minutes dedicated to this patient today, preparing to see patient, examining the patient, ordering tests and/or medications and counseling the patient, documenting clinical information in the EHR or other health record, independently interpreting results and communicating results to the patient/family, discussing treatment and goals, answering patient's questions and coordinating care.  Cc:  Georgann Housekeeper, MD  Marlowe Kays 04/11/2023 5:13 PM

## 2023-04-12 ENCOUNTER — Encounter (HOSPITAL_COMMUNITY): Payer: Self-pay | Admitting: *Deleted

## 2023-04-12 NOTE — Progress Notes (Signed)
Internal Med/PCP - Dr Georgann Housekeeper Cardiologist - n/a Neurology - Marlowe Kays, PA-C  Chest x-ray - 02/25/20 EKG - DOS Stress Test - n/a ECHO - n/a Cardiac Cath - n/a  ICD Pacemaker/Loop - n/a  Sleep Study -  n/a CPAP - none  Diabetes - n/a  NPO  Anesthesia review: Yes  STOP now taking any Aspirin (unless otherwise instructed by your surgeon), Aleve, Naproxen, Ibuprofen, Motrin, Advil, Goody's, BC's, all herbal medications, fish oil, and all vitamins.   Coronavirus Screening Do you have any of the following symptoms:  Cough yes/no: No Fever (>100.62F)  yes/no: No Runny nose yes/no: No Sore throat yes/no: No Difficulty breathing/shortness of breath  yes/no: No  Have you traveled in the last 14 days and where? yes/no: No  Patient's Daughter Vassie Moselle verbalized understanding of instructions that were given via phone.

## 2023-04-13 ENCOUNTER — Ambulatory Visit (HOSPITAL_COMMUNITY)
Admission: RE | Admit: 2023-04-13 | Discharge: 2023-04-13 | Disposition: A | Discharge status: Home / Self Care | Payer: 59 | Attending: Internal Medicine | Admitting: Internal Medicine

## 2023-04-13 ENCOUNTER — Ambulatory Visit (HOSPITAL_COMMUNITY)
Admission: RE | Admit: 2023-04-13 | Discharge: 2023-04-13 | Disposition: A | Discharge status: Home / Self Care | Payer: 59 | Source: Ambulatory Visit | Attending: Internal Medicine | Admitting: Internal Medicine

## 2023-04-13 ENCOUNTER — Ambulatory Visit (HOSPITAL_BASED_OUTPATIENT_CLINIC_OR_DEPARTMENT_OTHER): Payer: 59 | Admitting: Certified Registered"

## 2023-04-13 ENCOUNTER — Other Ambulatory Visit: Payer: Self-pay

## 2023-04-13 ENCOUNTER — Encounter (HOSPITAL_COMMUNITY): Payer: Self-pay

## 2023-04-13 ENCOUNTER — Ambulatory Visit (HOSPITAL_COMMUNITY): Payer: 59 | Admitting: Certified Registered"

## 2023-04-13 ENCOUNTER — Encounter (HOSPITAL_COMMUNITY): Admission: RE | Disposition: A | Discharge status: Home / Self Care | Payer: Self-pay | Source: Home / Self Care

## 2023-04-13 DIAGNOSIS — E782 Mixed hyperlipidemia: Secondary | ICD-10-CM | POA: Insufficient documentation

## 2023-04-13 DIAGNOSIS — Z87891 Personal history of nicotine dependence: Secondary | ICD-10-CM | POA: Insufficient documentation

## 2023-04-13 DIAGNOSIS — F05 Delirium due to known physiological condition: Secondary | ICD-10-CM | POA: Insufficient documentation

## 2023-04-13 DIAGNOSIS — N1831 Chronic kidney disease, stage 3a: Secondary | ICD-10-CM | POA: Diagnosis not present

## 2023-04-13 DIAGNOSIS — M25451 Effusion, right hip: Secondary | ICD-10-CM | POA: Insufficient documentation

## 2023-04-13 DIAGNOSIS — J449 Chronic obstructive pulmonary disease, unspecified: Secondary | ICD-10-CM | POA: Diagnosis not present

## 2023-04-13 DIAGNOSIS — R222 Localized swelling, mass and lump, trunk: Secondary | ICD-10-CM | POA: Insufficient documentation

## 2023-04-13 DIAGNOSIS — N3020 Other chronic cystitis without hematuria: Secondary | ICD-10-CM | POA: Insufficient documentation

## 2023-04-13 DIAGNOSIS — M1611 Unilateral primary osteoarthritis, right hip: Secondary | ICD-10-CM | POA: Diagnosis not present

## 2023-04-13 DIAGNOSIS — E45 Retarded development following protein-calorie malnutrition: Secondary | ICD-10-CM | POA: Insufficient documentation

## 2023-04-13 DIAGNOSIS — I10 Essential (primary) hypertension: Secondary | ICD-10-CM | POA: Diagnosis not present

## 2023-04-13 DIAGNOSIS — I129 Hypertensive chronic kidney disease with stage 1 through stage 4 chronic kidney disease, or unspecified chronic kidney disease: Secondary | ICD-10-CM | POA: Insufficient documentation

## 2023-04-13 DIAGNOSIS — M810 Age-related osteoporosis without current pathological fracture: Secondary | ICD-10-CM | POA: Insufficient documentation

## 2023-04-13 DIAGNOSIS — L0231 Cutaneous abscess of buttock: Secondary | ICD-10-CM | POA: Diagnosis not present

## 2023-04-13 DIAGNOSIS — F0390 Unspecified dementia without behavioral disturbance: Secondary | ICD-10-CM | POA: Insufficient documentation

## 2023-04-13 DIAGNOSIS — R7303 Prediabetes: Secondary | ICD-10-CM | POA: Insufficient documentation

## 2023-04-13 DIAGNOSIS — K5730 Diverticulosis of large intestine without perforation or abscess without bleeding: Secondary | ICD-10-CM | POA: Diagnosis not present

## 2023-04-13 HISTORY — DX: Chronic kidney disease, unspecified: N18.9

## 2023-04-13 HISTORY — PX: RADIOLOGY WITH ANESTHESIA: SHX6223

## 2023-04-13 HISTORY — DX: Chronic obstructive pulmonary disease, unspecified: J44.9

## 2023-04-13 HISTORY — DX: Age-related osteoporosis without current pathological fracture: M81.0

## 2023-04-13 SURGERY — MRI WITH ANESTHESIA
Anesthesia: General

## 2023-04-13 MED ORDER — LIDOCAINE 2% (20 MG/ML) 5 ML SYRINGE
INTRAMUSCULAR | Status: DC | PRN
  Administered 2023-04-13: 100 mg via INTRAVENOUS

## 2023-04-13 MED ORDER — ONDANSETRON HCL 4 MG/2ML IJ SOLN: 4.0000 mg | Freq: Once | INTRAMUSCULAR | Status: DC | PRN

## 2023-04-13 MED ORDER — ORAL CARE MOUTH RINSE: 15.0000 mL | Freq: Once | OROMUCOSAL | Status: DC

## 2023-04-13 MED ORDER — GADOBUTROL 1 MMOL/ML IV SOLN
7.5000 mL | Freq: Once | INTRAVENOUS | Status: AC | PRN
  Administered 2023-04-13: 7.5 mL via INTRAVENOUS

## 2023-04-13 MED ORDER — EPHEDRINE SULFATE-NACL 50-0.9 MG/10ML-% IV SOSY
PREFILLED_SYRINGE | INTRAVENOUS | Status: DC | PRN
  Administered 2023-04-13: 10 mg via INTRAVENOUS
  Administered 2023-04-13: 5 mg via INTRAVENOUS
  Administered 2023-04-13: 10 mg via INTRAVENOUS

## 2023-04-13 MED ORDER — PHENYLEPHRINE 80 MCG/ML (10ML) SYRINGE FOR IV PUSH (FOR BLOOD PRESSURE SUPPORT)
PREFILLED_SYRINGE | INTRAVENOUS | Status: DC | PRN
  Administered 2023-04-13: 160 ug via INTRAVENOUS
  Administered 2023-04-13: 80 ug via INTRAVENOUS
  Administered 2023-04-13 (×2): 160 ug via INTRAVENOUS

## 2023-04-13 MED ORDER — FENTANYL CITRATE (PF) 100 MCG/2ML IJ SOLN: 25.0000 ug | INTRAMUSCULAR | Status: DC | PRN

## 2023-04-13 MED ORDER — CHLORHEXIDINE GLUCONATE 0.12 % MT SOLN: 15.0000 mL | Freq: Once | OROMUCOSAL | Status: DC

## 2023-04-13 MED ORDER — ONDANSETRON HCL 4 MG/2ML IJ SOLN
INTRAMUSCULAR | Status: DC | PRN
  Administered 2023-04-13: 4 mg via INTRAVENOUS

## 2023-04-13 MED ORDER — PROPOFOL 10 MG/ML IV BOLUS
INTRAVENOUS | Status: DC | PRN
  Administered 2023-04-13: 100 mg via INTRAVENOUS

## 2023-04-13 MED ORDER — LACTATED RINGERS IV SOLN: INTRAVENOUS | Status: DC

## 2023-04-13 MED ORDER — DEXAMETHASONE SODIUM PHOSPHATE 10 MG/ML IJ SOLN
INTRAMUSCULAR | Status: DC | PRN
  Administered 2023-04-13: 5 mg via INTRAVENOUS

## 2023-04-13 NOTE — Anesthesia Preprocedure Evaluation (Addendum)
Anesthesia Evaluation  Patient identified by MRN, date of birth, ID band Patient awake    Reviewed: Allergy & Precautions, H&P , NPO status , Patient's Chart, lab work & pertinent test results  Airway Mallampati: II  TM Distance: >3 FB Neck ROM: Full    Dental  (+) Upper Dentures, Lower Dentures   Pulmonary COPD, former smoker   Pulmonary exam normal breath sounds clear to auscultation       Cardiovascular hypertension, Normal cardiovascular exam Rhythm:Regular Rate:Normal     Neuro/Psych       Dementia negative neurological ROS     GI/Hepatic negative GI ROS, Neg liver ROS,,,  Endo/Other  negative endocrine ROS    Renal/GU Renal InsufficiencyRenal disease  negative genitourinary   Musculoskeletal negative musculoskeletal ROS (+)    Abdominal   Peds negative pediatric ROS (+)  Hematology negative hematology ROS (+)   Anesthesia Other Findings   Reproductive/Obstetrics negative OB ROS                             Anesthesia Physical Anesthesia Plan  ASA: 3  Anesthesia Plan: General   Post-op Pain Management: Minimal or no pain anticipated   Induction: Intravenous  PONV Risk Score and Plan: 3 and Ondansetron, Dexamethasone and Treatment may vary due to age or medical condition  Airway Management Planned: LMA  Additional Equipment:   Intra-op Plan:   Post-operative Plan: Extubation in OR  Informed Consent: I have reviewed the patients History and Physical, chart, labs and discussed the procedure including the risks, benefits and alternatives for the proposed anesthesia with the patient or authorized representative who has indicated his/her understanding and acceptance.     Dental advisory given  Plan Discussed with: CRNA and Surgeon  Anesthesia Plan Comments:        Anesthesia Quick Evaluation

## 2023-04-13 NOTE — Transfer of Care (Signed)
Immediate Anesthesia Transfer of Care Note  Patient: Katie Jackson  Procedure(s) Performed: MRI WITH ANESTHESIA OF PELVIS WITH AND WITHOUT CONTRAST  Patient Location: PACU  Anesthesia Type:General  Level of Consciousness: drowsy  Airway & Oxygen Therapy: Patient Spontanous Breathing  Post-op Assessment: Report given to RN and Post -op Vital signs reviewed and stable  Post vital signs: Reviewed and stable  Last Vitals:  Vitals Value Taken Time  BP 134/57   Temp    Pulse 101 04/13/23 1130  Resp 8 04/13/23 1131  SpO2 92% 04/13/23 1130  Vitals shown include unvalidated device data.  Last Pain: There were no vitals filed for this visit.       Complications: No notable events documented.

## 2023-04-13 NOTE — Anesthesia Procedure Notes (Signed)
Procedure Name: LMA Insertion Date/Time: 04/13/2023 10:23 AM  Performed by: Katina Degree, CRNAPre-anesthesia Checklist: Patient identified, Emergency Drugs available, Suction available and Patient being monitored Patient Re-evaluated:Patient Re-evaluated prior to induction Oxygen Delivery Method: Circle system utilized Preoxygenation: Pre-oxygenation with 100% oxygen Induction Type: IV induction Ventilation: Mask ventilation without difficulty LMA: LMA inserted LMA Size: 4.0 Laser Tube: Cuffed inflated with minimal occlusive pressure - saline Placement Confirmation: positive ETCO2 and breath sounds checked- equal and bilateral Tube secured with: Tape Dental Injury: Teeth and Oropharynx as per pre-operative assessment

## 2023-04-13 NOTE — Anesthesia Postprocedure Evaluation (Signed)
Anesthesia Post Note  Patient: Katie Jackson  Procedure(s) Performed: MRI WITH ANESTHESIA OF PELVIS WITH AND WITHOUT CONTRAST     Patient location during evaluation: PACU Anesthesia Type: General Level of consciousness: awake and alert Pain management: pain level controlled Vital Signs Assessment: post-procedure vital signs reviewed and stable Respiratory status: spontaneous breathing, nonlabored ventilation, respiratory function stable and patient connected to nasal cannula oxygen Cardiovascular status: blood pressure returned to baseline and stable Postop Assessment: no apparent nausea or vomiting Anesthetic complications: no  No notable events documented.  Last Vitals:  Vitals:   04/13/23 1130 04/13/23 1145  BP: (!) 134/57 (!) 117/52  Pulse: (!) 101 91  Resp: 20 17  Temp: (!) 36.2 C   SpO2: 91% 94%    Last Pain: There were no vitals filed for this visit.               Raysha Tilmon S

## 2023-04-14 ENCOUNTER — Encounter (HOSPITAL_COMMUNITY): Payer: Self-pay | Admitting: Radiology

## 2023-04-21 ENCOUNTER — Encounter (HOSPITAL_COMMUNITY): Payer: Self-pay

## 2023-04-21 ENCOUNTER — Inpatient Hospital Stay (HOSPITAL_COMMUNITY)
Admission: EM | Admit: 2023-04-21 | Discharge: 2023-04-27 | DRG: 872 | Disposition: A | Discharge status: Home / Self Care | Payer: 59 | Attending: Family Medicine | Admitting: Family Medicine

## 2023-04-21 ENCOUNTER — Emergency Department (HOSPITAL_COMMUNITY): Payer: 59

## 2023-04-21 DIAGNOSIS — E872 Acidosis, unspecified: Secondary | ICD-10-CM | POA: Diagnosis not present

## 2023-04-21 DIAGNOSIS — M810 Age-related osteoporosis without current pathological fracture: Secondary | ICD-10-CM | POA: Diagnosis present

## 2023-04-21 DIAGNOSIS — J9811 Atelectasis: Secondary | ICD-10-CM | POA: Diagnosis not present

## 2023-04-21 DIAGNOSIS — A419 Sepsis, unspecified organism: Secondary | ICD-10-CM | POA: Diagnosis not present

## 2023-04-21 DIAGNOSIS — N179 Acute kidney failure, unspecified: Secondary | ICD-10-CM | POA: Diagnosis present

## 2023-04-21 DIAGNOSIS — J449 Chronic obstructive pulmonary disease, unspecified: Secondary | ICD-10-CM | POA: Diagnosis present

## 2023-04-21 DIAGNOSIS — Z1152 Encounter for screening for COVID-19: Secondary | ICD-10-CM

## 2023-04-21 DIAGNOSIS — I129 Hypertensive chronic kidney disease with stage 1 through stage 4 chronic kidney disease, or unspecified chronic kidney disease: Secondary | ICD-10-CM | POA: Diagnosis not present

## 2023-04-21 DIAGNOSIS — G309 Alzheimer's disease, unspecified: Secondary | ICD-10-CM | POA: Diagnosis not present

## 2023-04-21 DIAGNOSIS — F05 Delirium due to known physiological condition: Secondary | ICD-10-CM | POA: Diagnosis not present

## 2023-04-21 DIAGNOSIS — E7800 Pure hypercholesterolemia, unspecified: Secondary | ICD-10-CM | POA: Diagnosis present

## 2023-04-21 DIAGNOSIS — Z79899 Other long term (current) drug therapy: Secondary | ICD-10-CM

## 2023-04-21 DIAGNOSIS — Z86008 Personal history of in-situ neoplasm of other site: Secondary | ICD-10-CM | POA: Diagnosis not present

## 2023-04-21 DIAGNOSIS — N3000 Acute cystitis without hematuria: Secondary | ICD-10-CM | POA: Diagnosis not present

## 2023-04-21 DIAGNOSIS — N189 Chronic kidney disease, unspecified: Secondary | ICD-10-CM | POA: Diagnosis present

## 2023-04-21 DIAGNOSIS — R0603 Acute respiratory distress: Secondary | ICD-10-CM | POA: Diagnosis not present

## 2023-04-21 DIAGNOSIS — F03918 Unspecified dementia, unspecified severity, with other behavioral disturbance: Secondary | ICD-10-CM | POA: Diagnosis present

## 2023-04-21 DIAGNOSIS — R0600 Dyspnea, unspecified: Secondary | ICD-10-CM | POA: Diagnosis not present

## 2023-04-21 DIAGNOSIS — R4587 Impulsiveness: Secondary | ICD-10-CM | POA: Diagnosis present

## 2023-04-21 DIAGNOSIS — D649 Anemia, unspecified: Secondary | ICD-10-CM | POA: Diagnosis present

## 2023-04-21 DIAGNOSIS — E876 Hypokalemia: Secondary | ICD-10-CM | POA: Diagnosis not present

## 2023-04-21 DIAGNOSIS — Z880 Allergy status to penicillin: Secondary | ICD-10-CM

## 2023-04-21 DIAGNOSIS — Z87891 Personal history of nicotine dependence: Secondary | ICD-10-CM | POA: Diagnosis not present

## 2023-04-21 DIAGNOSIS — R6520 Severe sepsis without septic shock: Secondary | ICD-10-CM | POA: Diagnosis present

## 2023-04-21 DIAGNOSIS — F02C18 Dementia in other diseases classified elsewhere, severe, with other behavioral disturbance: Secondary | ICD-10-CM | POA: Diagnosis not present

## 2023-04-21 DIAGNOSIS — M545 Low back pain, unspecified: Secondary | ICD-10-CM | POA: Diagnosis present

## 2023-04-21 DIAGNOSIS — N390 Urinary tract infection, site not specified: Principal | ICD-10-CM

## 2023-04-21 DIAGNOSIS — R000 Tachycardia, unspecified: Secondary | ICD-10-CM | POA: Diagnosis not present

## 2023-04-21 DIAGNOSIS — I10 Essential (primary) hypertension: Secondary | ICD-10-CM

## 2023-04-21 LAB — CBC WITH DIFFERENTIAL/PLATELET
Abs Immature Granulocytes: 0.13 10*3/uL — ABNORMAL HIGH (ref 0.00–0.07)
Basophils Absolute: 0.1 10*3/uL (ref 0.0–0.1)
Basophils Relative: 1 %
Eosinophils Absolute: 0 10*3/uL (ref 0.0–0.5)
Eosinophils Relative: 0 %
HCT: 39.2 % (ref 36.0–46.0)
Hemoglobin: 12.5 g/dL (ref 12.0–15.0)
Immature Granulocytes: 1 %
Lymphocytes Relative: 8 %
Lymphs Abs: 1.3 10*3/uL (ref 0.7–4.0)
MCH: 25.3 pg — ABNORMAL LOW (ref 26.0–34.0)
MCHC: 31.9 g/dL (ref 30.0–36.0)
MCV: 79.2 fL — ABNORMAL LOW (ref 80.0–100.0)
Monocytes Absolute: 1.6 10*3/uL — ABNORMAL HIGH (ref 0.1–1.0)
Monocytes Relative: 10 %
Neutro Abs: 12.5 10*3/uL — ABNORMAL HIGH (ref 1.7–7.7)
Neutrophils Relative %: 80 %
Platelets: 290 10*3/uL (ref 150–400)
RBC: 4.95 MIL/uL (ref 3.87–5.11)
RDW: 16.4 % — ABNORMAL HIGH (ref 11.5–15.5)
WBC: 15.5 10*3/uL — ABNORMAL HIGH (ref 4.0–10.5)
nRBC: 0 % (ref 0.0–0.2)

## 2023-04-21 LAB — APTT: aPTT: 34 seconds (ref 24–36)

## 2023-04-21 LAB — COMPREHENSIVE METABOLIC PANEL
ALT: 14 U/L (ref 0–44)
AST: 19 U/L (ref 15–41)
Albumin: 3.5 g/dL (ref 3.5–5.0)
Alkaline Phosphatase: 75 U/L (ref 38–126)
Anion gap: 10 (ref 5–15)
BUN: 27 mg/dL — ABNORMAL HIGH (ref 8–23)
CO2: 20 mmol/L — ABNORMAL LOW (ref 22–32)
Calcium: 9.2 mg/dL (ref 8.9–10.3)
Chloride: 105 mmol/L (ref 98–111)
Creatinine, Ser: 1.59 mg/dL — ABNORMAL HIGH (ref 0.44–1.00)
GFR, Estimated: 33 mL/min — ABNORMAL LOW (ref >60)
Glucose, Bld: 152 mg/dL — ABNORMAL HIGH (ref 70–99)
Potassium: 3.8 mmol/L (ref 3.5–5.1)
Sodium: 135 mmol/L (ref 135–145)
Total Bilirubin: 0.7 mg/dL (ref 0.3–1.2)
Total Protein: 7.7 g/dL (ref 6.5–8.1)

## 2023-04-21 LAB — URINALYSIS, ROUTINE W REFLEX MICROSCOPIC
Bilirubin Urine: NEGATIVE
Glucose, UA: NEGATIVE mg/dL
Ketones, ur: NEGATIVE mg/dL
Nitrite: POSITIVE — AB
Protein, ur: 100 mg/dL — AB
Specific Gravity, Urine: 1.014 (ref 1.005–1.030)
WBC, UA: >50 WBC/hpf (ref 0–5)
pH: 5 (ref 5.0–8.0)

## 2023-04-21 LAB — LACTIC ACID, PLASMA: Lactic Acid, Venous: 1.8 mmol/L (ref 0.5–1.9)

## 2023-04-21 LAB — RESP PANEL BY RT-PCR (RSV, FLU A&B, COVID)  RVPGX2
Influenza A by PCR: NEGATIVE
Influenza B by PCR: NEGATIVE
Resp Syncytial Virus by PCR: NEGATIVE
SARS Coronavirus 2 by RT PCR: NEGATIVE

## 2023-04-21 LAB — LIPASE, BLOOD: Lipase: 36 U/L (ref 11–51)

## 2023-04-21 LAB — PROTIME-INR
INR: 1.2 (ref 0.8–1.2)
Prothrombin Time: 15.3 seconds — ABNORMAL HIGH (ref 11.4–15.2)

## 2023-04-21 LAB — CULTURE, BLOOD (ROUTINE X 2): Special Requests: ADEQUATE

## 2023-04-21 NOTE — ED Triage Notes (Signed)
Per family pt recently finished antibiotics for UTI, still has strong odor, fevers today, generalized weakness and more altered, hx of dementia.

## 2023-04-21 NOTE — ED Provider Triage Note (Signed)
Emergency Medicine Provider Triage Evaluation Note  Katie Jackson , a 77 y.o. female  was evaluated in triage.  Patient brought in by daughter for concern of sepsis.  Patient has history of dementia and does not answer questions appropriately and so all history obtained from daughter.  Daughter states that patient struggles with UTIs and she is worried that patient is becoming septic.  She had sepsis approximately 2 years ago.  Mental status is at baseline.  Daughter reports that today patient had a fever of 102 Fahrenheit.  She typically wears a diaper but is able to void urine on her own if prompted.  Unclear if patient is having any pain.  No recent antibiotics.  Remainder of history unable to obtain secondary to patient's dementia.  Review of Systems  Positive: See HPI Negative: See HPI  Physical Exam  BP 123/65   Pulse (!) 128   Temp 100.3 F (37.9 C) (Oral)   Resp 16   SpO2 94%  Gen:   Awake, no distress   Resp:  Normal effort lungs clear to auscultation MSK:   Moves extremities without difficulty  Other:  Patient is not alert and oriented, baseline mental status per daughter, abdomen soft without obvious evidence of tenderness, no abdominal distention, no CVA tenderness  Medical Decision Making  Medically screening exam initiated at 9:12 PM.  Appropriate orders placed.  Ayriel Kovalsky was informed that the remainder of the evaluation will be completed by another provider, this initial triage assessment does not replace that evaluation, and the importance of remaining in the ED until their evaluation is complete.     Richardson Dopp 04/21/23 2114

## 2023-04-22 ENCOUNTER — Emergency Department (HOSPITAL_COMMUNITY): Payer: 59

## 2023-04-22 ENCOUNTER — Other Ambulatory Visit: Payer: Self-pay

## 2023-04-22 DIAGNOSIS — Z880 Allergy status to penicillin: Secondary | ICD-10-CM | POA: Diagnosis not present

## 2023-04-22 DIAGNOSIS — N390 Urinary tract infection, site not specified: Secondary | ICD-10-CM

## 2023-04-22 DIAGNOSIS — F03918 Unspecified dementia, unspecified severity, with other behavioral disturbance: Secondary | ICD-10-CM | POA: Diagnosis not present

## 2023-04-22 DIAGNOSIS — E7800 Pure hypercholesterolemia, unspecified: Secondary | ICD-10-CM | POA: Diagnosis present

## 2023-04-22 DIAGNOSIS — J449 Chronic obstructive pulmonary disease, unspecified: Secondary | ICD-10-CM | POA: Diagnosis present

## 2023-04-22 DIAGNOSIS — M810 Age-related osteoporosis without current pathological fracture: Secondary | ICD-10-CM | POA: Diagnosis present

## 2023-04-22 DIAGNOSIS — N3000 Acute cystitis without hematuria: Secondary | ICD-10-CM

## 2023-04-22 DIAGNOSIS — I10 Essential (primary) hypertension: Secondary | ICD-10-CM

## 2023-04-22 DIAGNOSIS — F02C18 Dementia in other diseases classified elsewhere, severe, with other behavioral disturbance: Secondary | ICD-10-CM | POA: Diagnosis present

## 2023-04-22 DIAGNOSIS — F05 Delirium due to known physiological condition: Secondary | ICD-10-CM | POA: Diagnosis not present

## 2023-04-22 DIAGNOSIS — A419 Sepsis, unspecified organism: Secondary | ICD-10-CM

## 2023-04-22 DIAGNOSIS — G309 Alzheimer's disease, unspecified: Secondary | ICD-10-CM | POA: Diagnosis present

## 2023-04-22 DIAGNOSIS — E876 Hypokalemia: Secondary | ICD-10-CM | POA: Diagnosis not present

## 2023-04-22 DIAGNOSIS — M545 Low back pain, unspecified: Secondary | ICD-10-CM | POA: Diagnosis not present

## 2023-04-22 DIAGNOSIS — R0600 Dyspnea, unspecified: Secondary | ICD-10-CM | POA: Diagnosis not present

## 2023-04-22 DIAGNOSIS — Z87891 Personal history of nicotine dependence: Secondary | ICD-10-CM | POA: Diagnosis not present

## 2023-04-22 DIAGNOSIS — M4316 Spondylolisthesis, lumbar region: Secondary | ICD-10-CM | POA: Diagnosis not present

## 2023-04-22 DIAGNOSIS — E872 Acidosis, unspecified: Secondary | ICD-10-CM | POA: Diagnosis present

## 2023-04-22 DIAGNOSIS — Z1152 Encounter for screening for COVID-19: Secondary | ICD-10-CM | POA: Diagnosis not present

## 2023-04-22 DIAGNOSIS — N189 Chronic kidney disease, unspecified: Secondary | ICD-10-CM | POA: Diagnosis present

## 2023-04-22 DIAGNOSIS — Z86008 Personal history of in-situ neoplasm of other site: Secondary | ICD-10-CM | POA: Diagnosis not present

## 2023-04-22 DIAGNOSIS — R0603 Acute respiratory distress: Secondary | ICD-10-CM | POA: Diagnosis present

## 2023-04-22 DIAGNOSIS — I129 Hypertensive chronic kidney disease with stage 1 through stage 4 chronic kidney disease, or unspecified chronic kidney disease: Secondary | ICD-10-CM | POA: Diagnosis present

## 2023-04-22 DIAGNOSIS — Z79899 Other long term (current) drug therapy: Secondary | ICD-10-CM | POA: Diagnosis not present

## 2023-04-22 DIAGNOSIS — R6520 Severe sepsis without septic shock: Secondary | ICD-10-CM | POA: Diagnosis present

## 2023-04-22 DIAGNOSIS — M5137 Other intervertebral disc degeneration, lumbosacral region: Secondary | ICD-10-CM | POA: Diagnosis not present

## 2023-04-22 DIAGNOSIS — D649 Anemia, unspecified: Secondary | ICD-10-CM | POA: Diagnosis present

## 2023-04-22 DIAGNOSIS — N179 Acute kidney failure, unspecified: Secondary | ICD-10-CM | POA: Diagnosis present

## 2023-04-22 DIAGNOSIS — R4587 Impulsiveness: Secondary | ICD-10-CM | POA: Diagnosis present

## 2023-04-22 LAB — CBC
HCT: 31.5 % — ABNORMAL LOW (ref 36.0–46.0)
Hemoglobin: 9.9 g/dL — ABNORMAL LOW (ref 12.0–15.0)
MCH: 25 pg — ABNORMAL LOW (ref 26.0–34.0)
MCHC: 31.4 g/dL (ref 30.0–36.0)
MCV: 79.5 fL — ABNORMAL LOW (ref 80.0–100.0)
Platelets: 207 10*3/uL (ref 150–400)
RBC: 3.96 MIL/uL (ref 3.87–5.11)
RDW: 16.1 % — ABNORMAL HIGH (ref 11.5–15.5)
WBC: 12.3 10*3/uL — ABNORMAL HIGH (ref 4.0–10.5)
nRBC: 0 % (ref 0.0–0.2)

## 2023-04-22 LAB — LACTIC ACID, PLASMA
Lactic Acid, Venous: 2.5 mmol/L (ref 0.5–1.9)
Lactic Acid, Venous: 2.8 mmol/L (ref 0.5–1.9)
Lactic Acid, Venous: 2.3 mmol/L (ref 0.5–1.9)

## 2023-04-22 LAB — CREATININE, SERUM
Creatinine, Ser: 1.46 mg/dL — ABNORMAL HIGH (ref 0.44–1.00)
GFR, Estimated: 37 mL/min — ABNORMAL LOW (ref >60)

## 2023-04-22 LAB — MAGNESIUM: Magnesium: 1.7 mg/dL (ref 1.7–2.4)

## 2023-04-22 MED ORDER — LACTATED RINGERS IV BOLUS (SEPSIS)
1000.0000 mL | Freq: Once | INTRAVENOUS | Status: AC
  Administered 2023-04-22: 1000 mL via INTRAVENOUS

## 2023-04-22 MED ORDER — SODIUM CHLORIDE 0.9 % IV SOLN
2.0000 g | INTRAVENOUS | Status: DC
  Administered 2023-04-22 – 2023-04-26 (×6): 2 g via INTRAVENOUS
  Filled 2023-04-22 (×6): qty 20

## 2023-04-22 MED ORDER — IPRATROPIUM-ALBUTEROL 0.5-2.5 (3) MG/3ML IN SOLN: 3.0000 mL | Freq: Once | RESPIRATORY_TRACT | Status: DC

## 2023-04-22 MED ORDER — SENNOSIDES-DOCUSATE SODIUM 8.6-50 MG PO TABS: 1.0000 | ORAL_TABLET | Freq: Every evening | ORAL | Status: DC | PRN

## 2023-04-22 MED ORDER — LACTATED RINGERS IV SOLN: INTRAVENOUS | Status: AC

## 2023-04-22 MED ORDER — ACETAMINOPHEN 325 MG PO TABS
650.0000 mg | ORAL_TABLET | Freq: Four times a day (QID) | ORAL | Status: DC | PRN
  Administered 2023-04-23: 650 mg via ORAL
  Filled 2023-04-22: qty 2

## 2023-04-22 MED ORDER — QUETIAPINE FUMARATE 25 MG PO TABS
25.0000 mg | ORAL_TABLET | Freq: Every evening | ORAL | Status: DC | PRN
  Administered 2023-04-22: 25 mg via ORAL
  Filled 2023-04-22: qty 1

## 2023-04-22 MED ORDER — ENOXAPARIN SODIUM 30 MG/0.3ML IJ SOSY
30.0000 mg | PREFILLED_SYRINGE | Freq: Every day | INTRAMUSCULAR | Status: DC
  Administered 2023-04-23: 30 mg via SUBCUTANEOUS
  Filled 2023-04-22: qty 0.3

## 2023-04-22 MED ORDER — ACETAMINOPHEN 650 MG RE SUPP: 650.0000 mg | Freq: Four times a day (QID) | RECTAL | Status: DC | PRN

## 2023-04-22 MED ORDER — MAGNESIUM SULFATE 2 GM/50ML IV SOLN
2.0000 g | Freq: Once | INTRAVENOUS | Status: AC
  Administered 2023-04-22: 2 g via INTRAVENOUS
  Filled 2023-04-22: qty 50

## 2023-04-22 MED ORDER — LACTATED RINGERS IV BOLUS
500.0000 mL | Freq: Once | INTRAVENOUS | Status: AC
  Administered 2023-04-22: 500 mL via INTRAVENOUS

## 2023-04-22 MED ORDER — ACETAMINOPHEN 650 MG RE SUPP
650.0000 mg | Freq: Once | RECTAL | Status: AC
  Administered 2023-04-22: 650 mg via RECTAL
  Filled 2023-04-22: qty 1

## 2023-04-22 MED ORDER — LORAZEPAM 2 MG/ML IJ SOLN
0.5000 mg | Freq: Once | INTRAMUSCULAR | Status: AC
  Administered 2023-04-22: 0.5 mg via INTRAVENOUS
  Filled 2023-04-22: qty 1

## 2023-04-22 MED ORDER — SODIUM CHLORIDE 0.9 % IV SOLN: 1.0000 g | Freq: Once | INTRAVENOUS | Status: DC

## 2023-04-22 MED ORDER — QUETIAPINE FUMARATE 25 MG PO TABS: 50.0000 mg | ORAL_TABLET | Freq: Every evening | ORAL | Status: DC | PRN

## 2023-04-22 NOTE — H&P (Signed)
PCP:   Georgann Housekeeper, MD   Chief Complaint:  Weakness  HPI: This is a 77 year old female with history of severe dementia, and hypertension.  She has a history of recurrent UTI.  She lives at home with her daughter.  Today she started running a fever, became weak.  She has recently been dealing with a UTI and came off antibiotics several long ago.  There is no report of nausea and vomiting.  The patient was brought to the ER.  In the ER UA appears infected.  Tmax 100.3.  HR 128, RR up to 29, BP normal.  Lactic acid 1.8=> 2.5 => 2.8.  Patient received IV Rocephin in the ER.  She was also given sepsis IV fluids.  Review of Systems:  Per HPI.  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 2   Chronic kidney disease    stage 3   COPD (chronic obstructive pulmonary disease) (HCC)    Daughter denies this dx as of 02/22/23   High cholesterol    Hypertension    Past Surgical History:  Procedure Laterality Date   ABDOMINAL HYSTERECTOMY     BREAST BIOPSY     BUNIONECTOMY Left    cataract Bilateral    MULTIPLE TOOTH EXTRACTIONS     RADIOLOGY WITH ANESTHESIA N/A 04/13/2023   Procedure: MRI WITH ANESTHESIA OF PELVIS WITH AND WITHOUT CONTRAST;  Surgeon: Radiologist, Medication, MD;  Location: [facility] OR;  Service: Radiology;  Laterality: N/A;    Medications: Prior to Admission medications   Medication Sig Start Date End Date Taking? Authorizing Provider  amLODipine (NORVASC) 10 MG tablet Take 1 tablet (10 mg total) by mouth daily. 03/04/20   Albertine Grates, MD  Calcium Carb-Cholecalciferol (CALCIUM 600/VITAMIN D3 PO) Take 1 tablet by mouth 2 (two) times daily with a meal.     [provider]  donepezil (ARICEPT) 10 MG tablet Take 1 tablet (10 mg total) by mouth at bedtime. 04/08/22   Marcos Eke,  PA-C  lovastatin (MEVACOR) 20 MG tablet Take 20 mg by mouth at bedtime.    [provider]    Allergies:   Allergies  Allergen Reactions   Penicillins Other (See Comments)    From childhood; reaction not recalled- Has patient had a PCN reaction causing immediate rash, facial/tongue/throat swelling, SOB or lightheadedness with hypotension: Unk Has patient had a PCN reaction causing severe rash involving mucus membranes or skin necrosis: Unk Has patient had a PCN reaction that required hospitalization: Unk Has patient had a PCN reaction occurring within the last 10 years: No If all of the above answers are "NO", then may proceed with Cephalosporin use.     Social History:  reports that she has quit smoking. Her smoking use included cigarettes. She smoked an average of .5 packs per day. She has never used smokeless tobacco. She reports that she does not currently use alcohol. She reports that she does not use drugs.  Family History: History reviewed. No pertinent family history.  Physical Exam: Vitals:   04/22/23 0215 04/22/23 0230 04/22/23 0245 04/22/23 0356  BP: (!) 126/52 (!) 125/56 118/64   Pulse: 95 89 88   Resp: (!) 22 (!) 23 17   Temp:    99.3 F (37.4 C)  TempSrc:  Rectal  SpO2: 98% 96% 97%   Weight:      Height:        General: Awake, demented female, in no acute distress Eyes: PERRLA, pink conjunctiva, no scleral icterus ENT: Moist oral mucosa, neck supple, no thyromegaly Lungs: clear to ascultation, no wheeze, no crackles, no use of accessory muscles Cardiovascular: regular rate and rhythm, no regurgitation, no gallops, no murmurs. No carotid bruits, no JVD Abdomen: soft, positive BS, non-tender, non-distended, no organomegaly, not an acute abdomen GU: not examined Neuro: CN II - XII grossly intact, sensation intact Musculoskeletal: strength 5/5 all extremities, no clubbing, cyanosis or edema Skin: no rash, no subcutaneous crepitation, no  decubitus Psych: Demented female   Labs on Admission:  Recent Labs    04/21/23 2130  NA 135  K 3.8  CL 105  CO2 20*  GLUCOSE 152*  BUN 27*  CREATININE 1.59*  CALCIUM 9.2   Recent Labs    04/21/23 2130  AST 19  ALT 14  ALKPHOS 75  BILITOT 0.7  PROT 7.7  ALBUMIN 3.5   Recent Labs    04/21/23 2130  LIPASE 36   Recent Labs    04/21/23 2130  WBC 15.5*  NEUTROABS 12.5*  HGB 12.5  HCT 39.2  MCV 79.2*  PLT 290    Micro Results: Recent Results (from the past 240 hour(s))  Resp panel by RT-PCR (RSV, Flu A&B, Covid) Anterior Nasal Swab     Status: None   Collection Time: 04/21/23  9:12 PM   Specimen: Anterior Nasal Swab  Result Value Ref Range Status   SARS Coronavirus 2 by RT PCR NEGATIVE NEGATIVE Final   Influenza A by PCR NEGATIVE NEGATIVE Final   Influenza B by PCR NEGATIVE NEGATIVE Final    Comment: (NOTE) The Xpert Xpress SARS-CoV-2/FLU/RSV plus assay is intended as an aid in the diagnosis of influenza from Nasopharyngeal swab specimens and should not be used as a sole basis for treatment. Nasal washings and aspirates are unacceptable for Xpert Xpress SARS-CoV-2/FLU/RSV testing.  Fact Sheet for Patients: BloggerCourse.com  Fact Sheet for Healthcare Providers: SeriousBroker.it  This test is not yet approved or cleared by the [place] FDA and has been authorized for detection and/or diagnosis of SARS-CoV-2 by FDA under an Emergency Use Authorization (EUA). This EUA will remain in effect (meaning this test can be used) for the duration of the COVID-19 declaration under Section 564(b)(1) of the Act, 21 U.S.C. section 360bbb-3(b)(1), unless the authorization is terminated or revoked.     Resp Syncytial Virus by PCR NEGATIVE NEGATIVE Final    Comment: (NOTE) Fact Sheet for Patients: BloggerCourse.com  Fact Sheet for Healthcare  Providers: SeriousBroker.it  This test is not yet approved or cleared by the [place] FDA and has been authorized for detection and/or diagnosis of SARS-CoV-2 by FDA under an Emergency Use Authorization (EUA). This EUA will remain in effect (meaning this test can be used) for the duration of the COVID-19 declaration under Section 564(b)(1) of the Act, 21 U.S.C. section 360bbb-3(b)(1), unless the authorization is terminated or revoked.  Performed at [facility], [address] [place], [place] [address]   Blood Culture (routine x 2)     Status: None (Preliminary result)   Collection Time: 04/21/23  9:30 PM   Specimen: BLOOD  Result Value Ref Range Status   Specimen Description BLOOD BLOOD RIGHT HAND  Final   Special Requests   Final    BOTTLES DRAWN AEROBIC ONLY Blood  Culture adequate volume Performed at [facility], [address] [place], [place] [address]    Culture PENDING  Incomplete   Report Status PENDING  Incomplete     Radiological Exams on Admission: DG Chest Port 1 View  Result Date: 04/21/2023 CLINICAL DATA:  Questionable sepsis - evaluate for abnormality EXAM: PORTABLE CHEST 1 VIEW COMPARISON:  02/25/2020 FINDINGS: Heart and mediastinal contours within normal limits. Low lung volumes with bibasilar atelectasis. No effusions or pneumothorax. No acute bony abnormality. IMPRESSION: Low lung volumes, bibasilar atelectasis. Electronically Signed   By: Charlett Nose M.D.   On: 04/21/2023 22:01    Assessment/Plan Present on Admission:  Sepsis due to UTI/lactic acidosis -Blood cultures urine cultures collected -Continue IV Rocephin -Continue IV fluids -Follow lactic acid curve until normalized  Transient acute respiratory distress -While patient in ER, she developed wheezing and appeared to be in respiratory distress.  Repeat chest x-ray done was unrevealing.  Patient given nebulizers and improved. -Will keep patient n.p.o.,  consult speech to evaluate for aspiration -Wheezing started after patient was given IV Ativan.  Daughter believes this and IV fluids could be etiology.  I am more concerned with silent aspiration.  Dementia with behavioral disturbance (HCC) -Per daughter, patient sundown's -More recently she has had a cough while eating and drinking water. -Continue care of sleep Seroquel  Yehonatan Grandison 04/22/2023, 4:06 AM

## 2023-04-22 NOTE — Progress Notes (Signed)
Elink monitoring for the code sepsis protocol.  

## 2023-04-22 NOTE — Progress Notes (Signed)
Patient admitted after midnight, please see H&P.  Here with AMS from UTI.  Sepsis due to UTI/lactic acidosis -Blood cultures/urine cultures collected -Continue IV Rocephin -Follow lactic acid curve until normalized   Transient acute respiratory distress -While patient in ER, she developed wheezing and appeared to be in respiratory distress.  Repeat chest x-ray done was unrevealing.  Patient given nebulizers and improved. -Will keep patient n.p.o., consult speech to evaluate for aspiration -Wheezing started after patient was given IV Ativan-- avoid ativan in future   Dementia with behavioral disturbance (HCC) -Per daughter, patient sundown's -Seroquel prn  Marlin Canary DO

## 2023-04-22 NOTE — ED Provider Notes (Addendum)
[place] EMERGENCY DEPARTMENT AT [facility] Provider Note   CSN: [id number] Arrival date & time: 04/21/23  2033     History  Chief Complaint  Patient presents with   Altered Mental Status    Katie Jackson is a 77 y.o. female.  77 year old female with past medical history significant for dementia presents with her daughter for concern of UTI.  Daughter states patient since today has been febrile, weak.  She states she gets like this when she has a UTI.  She is concerned she may progress to sepsis like she did 2 years ago.  No other complaints.  Typically ambulates without difficulty around the house.  Unable to do that currently.  Unable to provide history due to underlying dementia.  Per daughter patient's mental status is at baseline.  Temperatures at home have been as high as 102.0.   The history is provided by a relative. No language interpreter was used.       Home Medications Prior to Admission medications   Medication Sig Start Date End Date Taking? Authorizing Provider  amLODipine (NORVASC) 10 MG tablet Take 1 tablet (10 mg total) by mouth daily. 03/04/20   Albertine Grates, MD  Calcium Carb-Cholecalciferol (CALCIUM 600/VITAMIN D3 PO) Take 1 tablet by mouth 2 (two) times daily with a meal.     [provider]  donepezil (ARICEPT) 10 MG tablet Take 1 tablet (10 mg total) by mouth at bedtime. 04/08/22   Marcos Eke, PA-C  lovastatin (MEVACOR) 20 MG tablet Take 20 mg by mouth at bedtime.    [provider]      Allergies    Penicillins    Review of Systems   Review of Systems  Unable to perform ROS: Dementia  Constitutional:  Positive for fever.  Neurological:  Positive for weakness.  All other systems reviewed and are negative.   Physical Exam Updated Vital Signs BP (!) 153/62   Pulse (!) 106   Temp 100.1 F (37.8 C) (Rectal)   Resp 20   Ht 5' (1.524 m)   Wt 61.2 kg   SpO2 97%   BMI 26.37 kg/m  Physical Exam Vitals and  nursing note reviewed.  Constitutional:      General: She is not in acute distress.    Appearance: Normal appearance. She is ill-appearing (Chronically ill-appearing).  HENT:     Head: Normocephalic and atraumatic.     Nose: Nose normal.  Eyes:     General: No scleral icterus.    Extraocular Movements: Extraocular movements intact.     Conjunctiva/sclera: Conjunctivae normal.  Cardiovascular:     Rate and Rhythm: Regular rhythm. Tachycardia present.     Pulses: Normal pulses.  Pulmonary:     Effort: Pulmonary effort is normal. No respiratory distress.     Breath sounds: Normal breath sounds. No wheezing or rales.  Abdominal:     General: There is no distension.     Tenderness: There is no abdominal tenderness.  Musculoskeletal:        General: Normal range of motion.     Cervical back: Normal range of motion.  Skin:    General: Skin is warm and dry.  Neurological:     General: No focal deficit present.     Mental Status: She is alert. Mental status is at baseline.     ED Results / Procedures / Treatments   Labs (all labs ordered are listed, but only abnormal results are displayed) Labs Reviewed  LACTIC ACID, PLASMA - Abnormal; Notable for the following components:      Result Value   Lactic Acid, Venous 2.5 (*)    All other components within normal limits  COMPREHENSIVE METABOLIC PANEL - Abnormal; Notable for the following components:   CO2 20 (*)    Glucose, Bld 152 (*)    BUN 27 (*)    Creatinine, Ser 1.59 (*)    GFR, Estimated 33 (*)    All other components within normal limits  CBC WITH DIFFERENTIAL/PLATELET - Abnormal; Notable for the following components:   WBC 15.5 (*)    MCV 79.2 (*)    MCH 25.3 (*)    RDW 16.4 (*)    Neutro Abs 12.5 (*)    Monocytes Absolute 1.6 (*)    Abs Immature Granulocytes 0.13 (*)    All other components within normal limits  PROTIME-INR - Abnormal; Notable for the following components:   Prothrombin Time 15.3 (*)    All other  components within normal limits  URINALYSIS, ROUTINE W REFLEX MICROSCOPIC - Abnormal; Notable for the following components:   Color, Urine AMBER (*)    APPearance CLOUDY (*)    Hgb urine dipstick MODERATE (*)    Protein, ur 100 (*)    Nitrite POSITIVE (*)    Leukocytes,Ua LARGE (*)    Bacteria, UA MANY (*)    Non Squamous Epithelial 0-5 (*)    All other components within normal limits  RESP PANEL BY RT-PCR (RSV, FLU A&B, COVID)  RVPGX2  CULTURE, BLOOD (ROUTINE X 2)  CULTURE, BLOOD (ROUTINE X 2)  LACTIC ACID, PLASMA  APTT  LIPASE, BLOOD    EKG None  Radiology DG Chest Port 1 View  Result Date: 04/21/2023 CLINICAL DATA:  Questionable sepsis - evaluate for abnormality EXAM: PORTABLE CHEST 1 VIEW COMPARISON:  02/25/2020 FINDINGS: Heart and mediastinal contours within normal limits. Low lung volumes with bibasilar atelectasis. No effusions or pneumothorax. No acute bony abnormality. IMPRESSION: Low lung volumes, bibasilar atelectasis. Electronically Signed   By: Charlett Nose M.D.   On: 04/21/2023 22:01    Procedures .Critical Care  Performed by: Marita Kansas, PA-C Authorized by: Marita Kansas, PA-C   Critical care provider statement:    Critical care time (minutes):  32   Critical care was necessary to treat or prevent imminent or life-threatening deterioration of the following conditions:  Sepsis   Critical care was time spent personally by me on the following activities:  Development of treatment plan with patient or surrogate, discussions with consultants, evaluation of patient's response to treatment, examination of patient, ordering and review of laboratory studies, ordering and review of radiographic studies, ordering and performing treatments and interventions, pulse oximetry, re-evaluation of patient's condition and review of old charts   Care discussed with: admitting provider       Medications Ordered in ED Medications  lactated ringers infusion ( Intravenous New Bag/Given  04/22/23 0128)  cefTRIAXone (ROCEPHIN) 2 g in sodium chloride 0.9 % 100 mL IVPB (2 g Intravenous New Bag/Given 04/22/23 0135)  acetaminophen (TYLENOL) suppository 650 mg (650 mg Rectal Given 04/22/23 0120)  lactated ringers bolus 1,000 mL (1,000 mLs Intravenous New Bag/Given 04/22/23 0129)    And  lactated ringers bolus 1,000 mL (1,000 mLs Intravenous New Bag/Given 04/22/23 0132)    ED Course/ Medical Decision Making/ A&P  Medical Decision Making Amount and/or Complexity of Data Reviewed Labs: ordered.  Risk OTC drugs. Prescription drug management. Decision regarding hospitalization.   Medical Decision Making / ED Course   This patient presents to the ED for concern of weakness, fever, this involves an extensive number of treatment options, and is a complaint that carries with it a high risk of complications and morbidity.  The differential diagnosis includes UTI, pneumonia, sepsis, viral URI  MDM: 77 year old female presents for concern of UTI and sepsis due to fever, and weakness.  History of frequent UTIs.  Most recently treated for this 2 weeks ago.  She is tachycardic.  Labs obtained through triage do show a leukocytosis.  UA is positive for UTI.  CMP shows creatinine 1.59 which is not too far removed from her baseline.  Respiratory panel negative for COVID, flu, RSV.  Initial lactic acid of 1.8, repeat increased to 2.5.  Blood cultures pending.  Chest x-ray without acute cardiopulmonary process.  EKG without acute ischemic changes.  Will start patient on Rocephin.  Give volume resuscitation.  Give Tylenol.  During my exam patient was with a temperature of 100.2 orally.  Did request a rectal temp.  Given UTI as a source of her infection, with SIRS positive criteria will discuss with hospitalist for admission.   Discussed with hospitalist.  They will evaluate patient for admission.  Additional history obtained: -Additional history obtained from daughter at  bedside -External records from outside source obtained and reviewed including: Chart review including previous notes, labs, imaging, consultation notes   Lab Tests: -I ordered, reviewed, and interpreted labs.   The pertinent results include:   Labs Reviewed  LACTIC ACID, PLASMA - Abnormal; Notable for the following components:      Result Value   Lactic Acid, Venous 2.5 (*)    All other components within normal limits  COMPREHENSIVE METABOLIC PANEL - Abnormal; Notable for the following components:   CO2 20 (*)    Glucose, Bld 152 (*)    BUN 27 (*)    Creatinine, Ser 1.59 (*)    GFR, Estimated 33 (*)    All other components within normal limits  CBC WITH DIFFERENTIAL/PLATELET - Abnormal; Notable for the following components:   WBC 15.5 (*)    MCV 79.2 (*)    MCH 25.3 (*)    RDW 16.4 (*)    Neutro Abs 12.5 (*)    Monocytes Absolute 1.6 (*)    Abs Immature Granulocytes 0.13 (*)    All other components within normal limits  PROTIME-INR - Abnormal; Notable for the following components:   Prothrombin Time 15.3 (*)    All other components within normal limits  URINALYSIS, ROUTINE W REFLEX MICROSCOPIC - Abnormal; Notable for the following components:   Color, Urine AMBER (*)    APPearance CLOUDY (*)    Hgb urine dipstick MODERATE (*)    Protein, ur 100 (*)    Nitrite POSITIVE (*)    Leukocytes,Ua LARGE (*)    Bacteria, UA MANY (*)    Non Squamous Epithelial 0-5 (*)    All other components within normal limits  RESP PANEL BY RT-PCR (RSV, FLU A&B, COVID)  RVPGX2  CULTURE, BLOOD (ROUTINE X 2)  CULTURE, BLOOD (ROUTINE X 2)  LACTIC ACID, PLASMA  APTT  LIPASE, BLOOD      EKG  EKG Interpretation  Date/Time:    Ventricular Rate:    PR Interval:    QRS Duration:   QT Interval:  QTC Calculation:   R Axis:     Text Interpretation:           Imaging Studies ordered: I ordered imaging studies including chest x-ray I independently visualized and interpreted  imaging. I agree with the radiologist interpretation   Medicines ordered and prescription drug management: Meds ordered this encounter  Medications   acetaminophen (TYLENOL) suppository 650 mg   DISCONTD: cefTRIAXone (ROCEPHIN) 1 g in sodium chloride 0.9 % 100 mL IVPB    Order Specific Question:   Antibiotic Indication:    Answer:   UTI   lactated ringers infusion   AND Linked Order Group    lactated ringers bolus 1,000 mL     Order Specific Question:   Enter Patient Weight in Kilograms     Answer:   60    lactated ringers bolus 1,000 mL     Order Specific Question:   Enter Patient Weight in Kilograms     Answer:   60   cefTRIAXone (ROCEPHIN) 2 g in sodium chloride 0.9 % 100 mL IVPB    Order Specific Question:   Antibiotic Indication:    Answer:   UTI    -I have reviewed the patients home medicines and have made adjustments as needed  Critical interventions Large volume resuscitation, antibiotics, sepsis protocol  Reevaluation: After the interventions noted above, I reevaluated the patient and found that they have :stayed the same  Co morbidities that complicate the patient evaluation  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  They will evaluate patient for admission.  Final Clinical Impression(s) / ED Diagnoses Final diagnoses:  Urinary tract infection without hematuria, site unspecified  Sepsis, due to unspecified organism, unspecified whether acute organ dysfunction present [facility])    Rx / DC Orders ED Discharge Orders     None         Marita Kansas,  PA-C 04/22/23 0255    Marita Kansas, PA-C 04/22/23 0321    Sloan Leiter, DO 04/22/23 [phone]

## 2023-04-22 NOTE — ED Notes (Signed)
ED TO INPATIENT HANDOFF REPORT  ED Nurse Name and Phone #: Grant Fontana PM [phone]   S Name/Age/Gender Katie Jackson 77 y.o. female Room/Bed: 020C/020C  Code Status   Code Status: Prior  Home/SNF/Other Home  Is this baseline? Yes   Triage Complete: Triage complete  Chief Complaint Acute cystitis [N30.00]  Triage Note Per family pt recently finished antibiotics for UTI, still has strong odor, fevers today, generalized weakness and more altered, hx of dementia.    Allergies Allergies  Allergen Reactions   Penicillins Other (See Comments)    From childhood; reaction not recalled- Has patient had a PCN reaction causing immediate rash, facial/tongue/throat swelling, SOB or lightheadedness with hypotension: Unk Has patient had a PCN reaction causing severe rash involving mucus membranes or skin necrosis: Unk Has patient had a PCN reaction that required hospitalization: Unk Has patient had a PCN reaction occurring within the last 10 years: No If all of the above answers are "NO", then may proceed with Cephalosporin use.     Level of Care/Admitting Diagnosis ED Disposition     ED Disposition  Admit   Condition  --   Comment  Hospital Area: [facility] [[id number]]  Level of Care: Med-Surg [[id number]]  May admit patient to [facility] or [facility] if equivalent level of care is available:: No  Covid Evaluation: Confirmed COVID Negative  Diagnosis: Acute cystitis [595.0.ICD-9-CM]  Admitting Physician: Gery Pray [[id number]]  Attending Physician: Gery Pray [phone]  Certification:: I certify this patient will need inpatient services for at least 2 midnights  Estimated Length of Stay: 2          B Medical/Surgery History 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 2    Chronic kidney disease    stage 3   COPD (chronic obstructive pulmonary disease) (HCC)    Daughter denies this dx as of 02/22/23   High cholesterol    Hypertension    Past Surgical History:  Procedure Laterality Date   ABDOMINAL HYSTERECTOMY     BREAST BIOPSY     BUNIONECTOMY Left    cataract Bilateral    MULTIPLE TOOTH EXTRACTIONS     RADIOLOGY WITH ANESTHESIA N/A 04/13/2023   Procedure: MRI WITH ANESTHESIA OF PELVIS WITH AND WITHOUT CONTRAST;  Surgeon: Radiologist, Medication, MD;  Location: [facility] OR;  Service: Radiology;  Laterality: N/A;     A IV Location/Drains/Wounds Patient Lines/Drains/Airways Status     Active Line/Drains/Airways     Name Placement date Placement time Site Days   Peripheral IV 04/22/23 20 G 1" Left Antecubital 04/22/23  0100  Antecubital  less than 1   Peripheral IV 04/22/23 20 G 1" Anterior;Right Forearm 04/22/23  0108  Forearm  less than 1   Pressure Injury 02/26/20 Sacrum Mid Stage 1 -  Intact skin with non-blanchable redness of a localized area usually over a bony prominence. 02/26/20  1444  -- 1151            Intake/Output Last 24 hours  Intake/Output Summary (Last 24 hours) at 04/22/2023 0431 Last data filed at 04/22/2023 0236 Gross per 24 hour  Intake 2100 ml  Output --  Net 2100 ml    Labs/Imaging Results for orders placed or performed during the hospital encounter of 04/21/23 (from  the past 48 hour(s))  Resp panel by RT-PCR (RSV, Flu A&B, Covid) Anterior Nasal Swab     Status: None   Collection Time: 04/21/23  9:12 PM   Specimen: Anterior Nasal Swab  Result Value Ref Range   SARS Coronavirus 2 by RT PCR NEGATIVE NEGATIVE   Influenza A by PCR NEGATIVE NEGATIVE   Influenza B by PCR NEGATIVE NEGATIVE    Comment: (NOTE) The Xpert Xpress SARS-CoV-2/FLU/RSV plus assay is intended as an aid in the diagnosis of influenza from Nasopharyngeal swab specimens and should not be used as a sole basis for treatment. Nasal washings and aspirates are  unacceptable for Xpert Xpress SARS-CoV-2/FLU/RSV testing.  Fact Sheet for Patients: BloggerCourse.com  Fact Sheet for Healthcare Providers: SeriousBroker.it  This test is not yet approved or cleared by the [place] FDA and has been authorized for detection and/or diagnosis of SARS-CoV-2 by FDA under an Emergency Use Authorization (EUA). This EUA will remain in effect (meaning this test can be used) for the duration of the COVID-19 declaration under Section 564(b)(1) of the Act, 21 U.S.C. section 360bbb-3(b)(1), unless the authorization is terminated or revoked.     Resp Syncytial Virus by PCR NEGATIVE NEGATIVE    Comment: (NOTE) Fact Sheet for Patients: BloggerCourse.com  Fact Sheet for Healthcare Providers: SeriousBroker.it  This test is not yet approved or cleared by the [place] FDA and has been authorized for detection and/or diagnosis of SARS-CoV-2 by FDA under an Emergency Use Authorization (EUA). This EUA will remain in effect (meaning this test can be used) for the duration of the COVID-19 declaration under Section 564(b)(1) of the Act, 21 U.S.C. section 360bbb-3(b)(1), unless the authorization is terminated or revoked.  Performed at [facility], [address], [place], [place] [address]   Lactic acid, plasma     Status: None   Collection Time: 04/21/23  9:30 PM  Result Value Ref Range   Lactic Acid, Venous 1.8 0.5 - 1.9 mmol/L    Comment: Performed at [facility], [address], [place], [place] [address]  Comprehensive metabolic panel     Status: Abnormal   Collection Time: 04/21/23  9:30 PM  Result Value Ref Range   Sodium 135 135 - 145 mmol/L   Potassium 3.8 3.5 - 5.1 mmol/L   Chloride 105 98 - 111 mmol/L   CO2 20 (L) 22 - 32 mmol/L   Glucose, Bld 152 (H) 70 - 99 mg/dL    Comment: Glucose reference range applies only to samples taken  after fasting for at least 8 hours.   BUN 27 (H) 8 - 23 mg/dL   Creatinine, Ser 0.98 (H) 0.44 - 1.00 mg/dL   Calcium 9.2 8.9 - 11.9 mg/dL   Total Protein 7.7 6.5 - 8.1 g/dL   Albumin 3.5 3.5 - 5.0 g/dL   AST 19 15 - 41 U/L   ALT 14 0 - 44 U/L   Alkaline Phosphatase 75 38 - 126 U/L   Total Bilirubin 0.7 0.3 - 1.2 mg/dL   GFR, Estimated 33 (L) >60 mL/min    Comment: (NOTE) Calculated using the CKD-EPI Creatinine Equation (2021)    Anion gap 10 5 - 15    Comment: Performed at [facility], [address], [place], [place] [address]  CBC with Differential     Status: Abnormal   Collection Time: 04/21/23  9:30 PM  Result Value Ref Range   WBC 15.5 (H) 4.0 - 10.5 K/uL  RBC 4.95 3.87 - 5.11 MIL/uL   Hemoglobin 12.5 12.0 - 15.0 g/dL   HCT 09.8 11.9 - 14.7 %   MCV 79.2 (L) 80.0 - 100.0 fL   MCH 25.3 (L) 26.0 - 34.0 pg   MCHC 31.9 30.0 - 36.0 g/dL   RDW 82.9 (H) 56.2 - 13.0 %   Platelets 290 150 - 400 K/uL   nRBC 0.0 0.0 - 0.2 %   Neutrophils Relative % 80 %   Neutro Abs 12.5 (H) 1.7 - 7.7 K/uL   Lymphocytes Relative 8 %   Lymphs Abs 1.3 0.7 - 4.0 K/uL   Monocytes Relative 10 %   Monocytes Absolute 1.6 (H) 0.1 - 1.0 K/uL   Eosinophils Relative 0 %   Eosinophils Absolute 0.0 0.0 - 0.5 K/uL   Basophils Relative 1 %   Basophils Absolute 0.1 0.0 - 0.1 K/uL   Immature Granulocytes 1 %   Abs Immature Granulocytes 0.13 (H) 0.00 - 0.07 K/uL    Comment: Performed at [facility], [address] [place], [place] [address]  Protime-INR     Status: Abnormal   Collection Time: 04/21/23  9:30 PM  Result Value Ref Range   Prothrombin Time 15.3 (H) 11.4 - 15.2 seconds   INR 1.2 0.8 - 1.2    Comment: (NOTE) INR goal varies based on device and disease states. Performed at [facility], [address], [place], [place] [address]   APTT     Status: None   Collection Time: 04/21/23  9:30 PM  Result Value Ref Range   aPTT 34 24 - 36 seconds    Comment: Performed at [facility], [address] [place], [place] [address]  Blood Culture (routine x 2)     Status: None (Preliminary result)   Collection Time: 04/21/23  9:30 PM   Specimen: BLOOD  Result Value Ref Range   Specimen Description BLOOD BLOOD RIGHT HAND    Special Requests      BOTTLES DRAWN AEROBIC ONLY Blood Culture adequate volume Performed at [facility], [address] [place], [place] [address]    Culture PENDING    Report Status PENDING   Urinalysis, Routine w reflex microscopic -Urine, Clean Catch     Status: Abnormal   Collection Time: 04/21/23  9:30 PM  Result Value Ref Range   Color, Urine AMBER (A) YELLOW    Comment: BIOCHEMICALS MAY BE AFFECTED BY COLOR   APPearance CLOUDY (A) CLEAR   Specific Gravity, Urine 1.014 1.005 - 1.030   pH 5.0 5.0 - 8.0   Glucose, UA NEGATIVE NEGATIVE mg/dL   Hgb urine dipstick MODERATE (A) NEGATIVE   Bilirubin Urine NEGATIVE NEGATIVE   Ketones, ur NEGATIVE NEGATIVE mg/dL   Protein, ur 010 (A) NEGATIVE mg/dL   Nitrite POSITIVE (A) NEGATIVE   Leukocytes,Ua LARGE (A) NEGATIVE   RBC / HPF 11-20 0 - 5 RBC/hpf   WBC, UA >50 0 - 5 WBC/hpf   Bacteria, UA MANY (A) NONE SEEN   Squamous Epithelial / HPF 0-5 0 - 5 /HPF   WBC Clumps PRESENT    Mucus PRESENT    Non Squamous Epithelial 0-5 (A) NONE SEEN    Comment: Performed at [facility], [address], [place], [place] [address]  Lipase, blood     Status: None   Collection Time: 04/21/23  9:30 PM  Result Value Ref Range   Lipase 36 11 - 51 U/L  Comment: Performed at [facility], [address], [place], [place] [address]  Lactic acid, plasma     Status: Abnormal   Collection Time: 04/22/23 12:58 AM  Result Value Ref Range   Lactic Acid, Venous 2.5 (HH) 0.5 - 1.9 mmol/L    Comment: CRITICAL RESULT CALLED TO, READ BACK BY AND VERIFIED WITH Lyda Perone [facility] PARAMEDIC 0153 04/22/23 En[place] Derry Performed at [facility], [address], [place], [place] [address]   Lactic  acid, plasma     Status: Abnormal   Collection Time: 04/22/23  2:44 AM  Result Value Ref Range   Lactic Acid, Venous 2.8 (HH) 0.5 - 1.9 mmol/L    Comment: CRITICAL VALUE NOTED. VALUE IS CONSISTENT WITH PREVIOUSLY REPORTED/CALLED VALUE Performed at [facility], [address], [place], [place] [address]    DG Chest Port 1 View  Result Date: 04/21/2023 CLINICAL DATA:  Questionable sepsis - evaluate for abnormality EXAM: PORTABLE CHEST 1 VIEW COMPARISON:  02/25/2020 FINDINGS: Heart and mediastinal contours within normal limits. Low lung volumes with bibasilar atelectasis. No effusions or pneumothorax. No acute bony abnormality. IMPRESSION: Low lung volumes, bibasilar atelectasis. Electronically Signed   By: Charlett Nose M.D.   On: 04/21/2023 22:01    Pending Labs Unresulted Labs (From admission, onward)     Start     Ordered   04/22/23 0227  Lactic acid, plasma  Now then every 2 hours,   R (with STAT occurrences)      04/22/23 0226   04/21/23 2112  Blood Culture (routine x 2)  (Septic presentation on arrival (screening labs, nursing and treatment orders for obvious sepsis))  BLOOD CULTURE X 2,   STAT      04/21/23 2112            Vitals/Pain Today's Vitals   04/22/23 0215 04/22/23 0230 04/22/23 0245 04/22/23 0356  BP: (!) 126/52 (!) 125/56 118/64   Pulse: 95 89 88   Resp: (!) 22 (!) 23 17   Temp:    99.3 F (37.4 C)  TempSrc:    Rectal  SpO2: 98% 96% 97%   Weight:      Height:        Isolation Precautions No active isolations  Medications Medications  lactated ringers infusion ( Intravenous New Bag/Given 04/22/23 0128)  cefTRIAXone (ROCEPHIN) 2 g in sodium chloride 0.9 % 100 mL IVPB (0 g Intravenous Stopped 04/22/23 0207)  ipratropium-albuterol (DUONEB) 0.5-2.5 (3) MG/3ML nebulizer solution 3 mL (has no administration in time range)  acetaminophen (TYLENOL) suppository 650 mg (650 mg Rectal Given 04/22/23 0120)  lactated ringers bolus 1,000 mL (0 mLs Intravenous Stopped  04/22/23 0236)    And  lactated ringers bolus 1,000 mL (0 mLs Intravenous Stopped 04/22/23 0236)  LORazepam (ATIVAN) injection 0.5 mg (0.5 mg Intravenous Given 04/22/23 0331)  lactated ringers bolus 500 mL (500 mLs Intravenous Bolus from Bag 04/22/23 0420)    Mobility Walks with assistance at home     Focused Assessments Code Sepsis   R Recommendations: See Admitting Provider Note  Report given to:   Additional Notes: Patient has dementia, daughter bedside.

## 2023-04-22 NOTE — Progress Notes (Signed)
Notified provider of need to order repeat lactic acid. ° °

## 2023-04-22 NOTE — Evaluation (Signed)
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   Chronic kidney disease    stage 3   COPD (chronic obstructive pulmonary disease) (HCC)    Daughter denies this dx as of 02/22/23   High cholesterol    Hypertension    Past Surgical History:  Past Surgical History:  Procedure Laterality Date   ABDOMINAL HYSTERECTOMY     BREAST BIOPSY     BUNIONECTOMY Left    cataract Bilateral    MULTIPLE TOOTH EXTRACTIONS     RADIOLOGY WITH ANESTHESIA N/A 04/13/2023   Procedure: MRI WITH ANESTHESIA OF PELVIS WITH AND WITHOUT CONTRAST;  Surgeon: Radiologist, Medication, MD;  Location: [facility] OR;  Service: Radiology;  Laterality: N/A;   HPI:  77 year old female with past medical history significant for dementia presents with her daughter for concern of UTI. Pt was seen for a clinical swallow evaluation in the outpatient setting on 10/20/22 with recommendations for regular solids and thin liquids.    Assessment / Plan / Recommendation  Clinical Impression  Pt was seen for a clinical swallow evaluation and presents with suspected functional oropharyngeal swallowing abilities per limited evaluation with further evaluation recommended for solids.  Evaluation was abbreviated secondary to pt's lethargy.  She was encountered asleep in bed with daughter at bedside.  Pt roused briefly to max verbal and tactile stimulation and was able to consume thin liquid via straw and puree without overt s/sx of aspiration or difficulty.  Regular solids were not assess on  this date secondary to lethargy and missing bottom dentures (typically in place for solid consumption per daughter report).  Recommend initiation of Dysphagia 1 (puree) solids and thin liquids with medication administered crushed in puree when pt is awake/alert.  SLP will f/u to monitor diet tolerance and upgrade as able.  SLP Visit Diagnosis: Dysphagia, unspecified (R13.10)    Aspiration Risk  Mild aspiration risk    Diet Recommendation Thin liquid;Dysphagia 1 (Puree)   Liquid Administration via: Cup;Straw Medication Administration: Crushed with puree Compensations: Minimize environmental distractions;Slow rate;Small sips/bites Postural Changes: Seated upright at 90 degrees    Other  Recommendations Oral Care Recommendations: Oral care BID    Recommendations for follow up therapy are one component of a multi-disciplinary discharge planning process, led by the attending physician.  Recommendations may be updated based on patient status, additional functional criteria and insurance authorization.  Follow up Recommendations No SLP follow up      Assistance Recommended at Discharge    Functional Status Assessment Patient has had a recent decline in their functional status and demonstrates the ability to make significant improvements in function in a reasonable and predictable amount of time.  Frequency and Duration min 2x/week  2 weeks       Prognosis Prognosis for improved oropharyngeal function: Good Barriers to Reach Goals:  (lethargy)      Swallow Study   General HPI: 77 year old female with past medical history significant for dementia presents with her daughter for concern of UTI. Pt was seen for a  clinical swallow evaluation in the outpatient setting on 10/20/22 with recommendations for regular solids and thin liquids. Type of Study: Bedside Swallow Evaluation Previous Swallow Assessment: See HPI Diet Prior to this Study: NPO Temperature Spikes Noted: Yes Respiratory  Status: Room air History of Recent Intubation: No Behavior/Cognition: Lethargic/Drowsy Oral Care Completed by SLP: No Oral Cavity - Dentition: Dentures, top;Dentures, bottom (bottom dentures not available) Self-Feeding Abilities: Needs assist Patient Positioning: Upright in bed Baseline Vocal Quality: Normal Volitional Swallow:  (too lethargic to elicit)    Oral/Motor/Sensory Function Overall Oral Motor/Sensory Function: Other (comment) (unable to assess given lethargy)   Ice Chips Ice chips: Not tested   Thin Liquid Thin Liquid: Within functional limits Presentation: Straw    Nectar Thick Nectar Thick Liquid: Not tested   Honey Thick Honey Thick Liquid: Not tested   Puree Puree: Within functional limits Presentation: Spoon   Solid     Solid: Not tested     Eino Farber, M.S., CCC-SLP Acute Rehabilitation Services Office: [phone]  Shanon Rosser Lorie Cleckley 04/22/2023,12:36 PM

## 2023-04-23 DIAGNOSIS — N390 Urinary tract infection, site not specified: Secondary | ICD-10-CM | POA: Diagnosis not present

## 2023-04-23 DIAGNOSIS — A419 Sepsis, unspecified organism: Secondary | ICD-10-CM | POA: Diagnosis not present

## 2023-04-23 LAB — CBC WITH DIFFERENTIAL/PLATELET
Abs Immature Granulocytes: 0.07 10*3/uL (ref 0.00–0.07)
Basophils Absolute: 0.1 10*3/uL (ref 0.0–0.1)
Basophils Relative: 0 %
Eosinophils Absolute: 0.1 10*3/uL (ref 0.0–0.5)
Eosinophils Relative: 1 %
HCT: 30.8 % — ABNORMAL LOW (ref 36.0–46.0)
Hemoglobin: 9.8 g/dL — ABNORMAL LOW (ref 12.0–15.0)
Immature Granulocytes: 1 %
Lymphocytes Relative: 14 %
Lymphs Abs: 1.9 10*3/uL (ref 0.7–4.0)
MCH: 25 pg — ABNORMAL LOW (ref 26.0–34.0)
MCHC: 31.8 g/dL (ref 30.0–36.0)
MCV: 78.6 fL — ABNORMAL LOW (ref 80.0–100.0)
Monocytes Absolute: 1 10*3/uL (ref 0.1–1.0)
Monocytes Relative: 7 %
Neutro Abs: 11 10*3/uL — ABNORMAL HIGH (ref 1.7–7.7)
Neutrophils Relative %: 77 %
Platelets: 226 10*3/uL (ref 150–400)
RBC: 3.92 MIL/uL (ref 3.87–5.11)
RDW: 16.5 % — ABNORMAL HIGH (ref 11.5–15.5)
WBC: 14.2 10*3/uL — ABNORMAL HIGH (ref 4.0–10.5)
nRBC: 0 % (ref 0.0–0.2)

## 2023-04-23 LAB — LACTIC ACID, PLASMA: Lactic Acid, Venous: 0.8 mmol/L (ref 0.5–1.9)

## 2023-04-23 LAB — BASIC METABOLIC PANEL
Anion gap: 6 (ref 5–15)
BUN: 19 mg/dL (ref 8–23)
CO2: 22 mmol/L (ref 22–32)
Calcium: 7.9 mg/dL — ABNORMAL LOW (ref 8.9–10.3)
Chloride: 111 mmol/L (ref 98–111)
Creatinine, Ser: 1.19 mg/dL — ABNORMAL HIGH (ref 0.44–1.00)
GFR, Estimated: 47 mL/min — ABNORMAL LOW (ref >60)
Glucose, Bld: 109 mg/dL — ABNORMAL HIGH (ref 70–99)
Potassium: 3.3 mmol/L — ABNORMAL LOW (ref 3.5–5.1)
Sodium: 139 mmol/L (ref 135–145)

## 2023-04-23 LAB — CULTURE, BLOOD (ROUTINE X 2)

## 2023-04-23 LAB — GLUCOSE, CAPILLARY: Glucose-Capillary: 95 mg/dL (ref 70–99)

## 2023-04-23 MED ORDER — RISAQUAD PO CAPS
1.0000 | ORAL_CAPSULE | Freq: Every day | ORAL | Status: DC
  Administered 2023-04-23 – 2023-04-27 (×5): 1 via ORAL
  Filled 2023-04-23 (×5): qty 1

## 2023-04-23 MED ORDER — ENOXAPARIN SODIUM 40 MG/0.4ML IJ SOSY
40.0000 mg | PREFILLED_SYRINGE | Freq: Every day | INTRAMUSCULAR | Status: DC
  Administered 2023-04-24 – 2023-04-27 (×4): 40 mg via SUBCUTANEOUS
  Filled 2023-04-23 (×4): qty 0.4

## 2023-04-23 MED ORDER — POTASSIUM CHLORIDE CRYS ER 20 MEQ PO TBCR
40.0000 meq | EXTENDED_RELEASE_TABLET | Freq: Once | ORAL | Status: AC
  Administered 2023-04-23: 40 meq via ORAL
  Filled 2023-04-23: qty 2

## 2023-04-23 NOTE — Evaluation (Signed)
Physical Therapy Evaluation Patient Details Name: Katie Jackson MRN: [id number] DOB: [birth year] Today's Date: 04/23/2023  History of Present Illness  77 year old female admitted 5/3 with UTI.   PMH: severe dementia, and hypertension.  She has a history of recurrent UTI.  She lives at home with her granddaughter and daughter.  Clinical Impression  Pt admitted with above diagnosis. Pt limited due to pain and lethargy.  She was not very alert but when was aroused c/o back pain and crying when trying to roll and sit pt up. Messaged MD to make her aware. Will follow acutely. Pts family cares for pt at home.  Pt currently with functional limitations due to the deficits listed below (see PT Problem List). Pt will benefit from acute skilled PT to increase their independence and safety with mobility to allow discharge.          Recommendations for follow up therapy are one component of a multi-disciplinary discharge planning process, led by the attending physician.  Recommendations may be updated based on patient status, additional functional criteria and insurance authorization.  Follow Up Recommendations       Assistance Recommended at Discharge Frequent or constant Supervision/Assistance  Patient can return home with the following  A lot of help with walking and/or transfers;A lot of help with bathing/dressing/bathroom;Assistance with cooking/housework;Assist for transportation;Help with stairs or ramp for entrance    Equipment Recommendations None recommended by PT  Recommendations for Other Services       Functional Status Assessment Patient has had a recent decline in their functional status and demonstrates the ability to make significant improvements in function in a reasonable and predictable amount of time.     Precautions / Restrictions Precautions Precautions: Fall Restrictions Weight Bearing Restrictions: No      Mobility  Bed Mobility Overal bed mobility: Needs  Assistance Bed Mobility: Rolling, Sidelying to Sit Rolling: Max assist Sidelying to sit: Total assist       General bed mobility comments: Unable to get pt to sit up as she cried and moaned that her "back hurt".  Assisted pt back to supine and positioned in chair position. Notified MD, Dr. Benjamine Mola regarding pts pain.    Transfers                        Ambulation/Gait                  Stairs            Wheelchair Mobility    Modified Rankin (Stroke Patients Only)       Balance                                             Pertinent Vitals/Pain Pain Assessment Pain Assessment: Faces Faces Pain Scale: Hurts whole lot Breathing: normal Negative Vocalization: none Facial Expression: smiling or inexpressive Body Language: relaxed Consolability: no need to console PAINAD Score: 0 Pain Location: back Pain Descriptors / Indicators: Discomfort, Grimacing, Guarding Pain Intervention(s): Limited activity within patient's tolerance, Monitored during session, Repositioned    Home Living Family/patient expects to be discharged to:: Private residence Living Arrangements: Children;Other relatives (granddaughter lives with pt and is caregiver along with daughter) Available Help at Discharge: Family;Available 24 hours/day Type of Home: Apartment Home Access: Stairs to enter   Entrance Stairs-Number of Steps: 1   Home  Layout: One level Home Equipment: BSC/3in1;Cane - single point;Wheelchair - Forensic psychologist (2 wheels);Rollator (4 wheels);Grab bars - tub/shower;Shower seat;Hand held shower head      Prior Function Prior Level of Function : Needs assist             Mobility Comments: walked with RW - always with min assist by family ADLs Comments: mod assist for ADLs by family     Hand Dominance        Extremity/Trunk Assessment   Upper Extremity Assessment Upper Extremity Assessment: Defer to OT evaluation    Lower  Extremity Assessment Lower Extremity Assessment: RLE deficits/detail;LLE deficits/detail RLE Deficits / Details: Pt would not follow commands to move RLE: Unable to fully assess due to pain LLE Deficits / Details: Pt would not follow commands to move LLE: Unable to fully assess due to pain       Communication   Communication: No difficulties  Cognition Arousal/Alertness: Lethargic Behavior During Therapy: Flat affect Overall Cognitive Status: History of cognitive impairments - at baseline                                          General Comments General comments (skin integrity, edema, etc.): daughter was present    Exercises General Exercises - Lower Extremity Heel Slides: AAROM, Both, 5 reps, Supine   Assessment/Plan    PT Assessment Patient needs continued PT services  PT Problem List Decreased activity tolerance;Decreased balance;Decreased mobility;Decreased strength;Decreased range of motion;Decreased knowledge of use of DME;Decreased safety awareness;Pain       PT Treatment Interventions DME instruction;Functional mobility training;Gait training;Therapeutic activities;Therapeutic exercise;Balance training;Patient/family education    PT Goals (Current goals can be found in the Care Plan section)  Acute Rehab PT Goals Patient Stated Goal: to go home with granddaughter PT Goal Formulation: With patient/family Time For Goal Achievement: 05/07/23 Potential to Achieve Goals: Good    Frequency Min 3X/week     Co-evaluation               AM-PAC PT "6 Clicks" Mobility  Outcome Measure Help needed turning from your back to your side while in a flat bed without using bedrails?: A Lot Help needed moving from lying on your back to sitting on the side of a flat bed without using bedrails?: A Lot Help needed moving to and from a bed to a chair (including a wheelchair)?: Total Help needed standing up from a chair using your arms (e.g., wheelchair or  bedside chair)?: Total Help needed to walk in hospital room?: Total Help needed climbing 3-5 steps with a railing? : Total 6 Click Score: 8    End of Session   Activity Tolerance: Patient limited by fatigue;Patient limited by pain;Patient limited by lethargy Patient left: in bed;with call bell/phone within reach;with bed alarm set;with family/visitor present Nurse Communication: Mobility status;Need for lift equipment PT Visit Diagnosis: Muscle weakness (generalized) (M62.81);Pain Pain - part of body: Leg (back)    Time: 1610-9604 PT Time Calculation (min) (ACUTE ONLY): 18 min   Charges:   PT Evaluation $PT Eval Low Complexity: 1 Low          Eydie Wormley M,PT Acute Rehab Services [phone]   Bevelyn Buckles 04/23/2023, 3:01 PM

## 2023-04-23 NOTE — Progress Notes (Signed)
PROGRESS NOTE    Katie Jackson  RUE:[id number] DOB: 12-Jan-1946 DOA: 04/21/2023 PCP: Georgann Housekeeper, MD    Brief Narrative:  77 year old female with history of severe dementia, and hypertension.  She has a history of recurrent UTI.  She lives at home with her daughter.  Today she started running a fever, became weak.  She has recently been dealing with a UTI and came off antibiotics several long ago.  There is no report of nausea and vomiting.  The patient was brought to the ER.   In the ER UA appears infected.  Tmax 100.3.  HR 128, RR up to 29, BP normal.  Lactic acid 1.8=> 2.5 => 2.8.  Patient received IV Rocephin in the ER.  She was also given sepsis IV fluids.   Assessment and Plan: Sepsis due to UTI/lactic acidosis -Blood cultures/urine cultures collected??? Unclear is urine culture actually sent -Continue IV Rocephin -lactic acid normalized   Transient acute respiratory distress -While patient in ER, she developed wheezing and appeared to be in respiratory distress.  Repeat chest x-ray done was unrevealing.  Patient given nebulizers and improved. -Wheezing started after patient was given IV Ativan-- avoid ativan in future -added incentive spirometry   Dementia with behavioral disturbance (HCC) -Per daughter, patient sundowns -delirium precuations -Seroquel prn  Hypokalemia -replete  DVT prophylaxis: enoxaparin (LOVENOX) injection 30 mg Start: 04/22/23 1000    Code Status: Full Code Family Communication: at bedside  Disposition Plan:  Level of care: Med-Surg Status is: Inpatient Remains inpatient appropriate because: needs IV abx    Consultants:  none   Subjective: Eating this AM  Objective: Vitals:   04/22/23 2008 04/23/23 0350 04/23/23 0547 04/23/23 0756  BP: (!) 151/62 (!) 143/74  (!) 143/64  Pulse: (!) 108 (!) 106  87  Resp: 18 18  18   Temp: 98.3 F (36.8 C) (!) 102.9 F (39.4 C) (!) 100.5 F (38.1 C) 99.7 F (37.6 C)  TempSrc: Oral Oral Oral  Oral  SpO2: (!) 89% 90%  (!) 89%  Weight:      Height:        Intake/Output Summary (Last 24 hours) at 04/23/2023 1033 Last data filed at 04/23/2023 1478 Gross per 24 hour  Intake 220 ml  Output --  Net 220 ml   Filed Weights   04/22/23 0137  Weight: 61.2 kg    Examination:   General: Appearance:     Overweight female in no acute distress     Lungs:     respirations unlabored  Heart:    Normal heart rate.    MS:   All extremities are intact.    Neurologic:   Awake, alert       Data Reviewed: I have personally reviewed following labs and imaging studies  CBC: Recent Labs  Lab 04/21/23 2130 04/22/23 0626 04/23/23 0242  WBC 15.5* 12.3* 14.2*  NEUTROABS 12.5*  --  11.0*  HGB 12.5 9.9* 9.8*  HCT 39.2 31.5* 30.8*  MCV 79.2* 79.5* 78.6*  PLT 290 207 226   Basic Metabolic Panel: Recent Labs  Lab 04/21/23 2130 04/22/23 0626 04/23/23 0242  NA 135  --  139  K 3.8  --  3.3*  CL 105  --  111  CO2 20*  --  22  GLUCOSE 152*  --  109*  BUN 27*  --  19  CREATININE 1.59* 1.46* 1.19*  CALCIUM 9.2  --  7.9*  MG  --  1.7  --  GFR: Estimated Creatinine Clearance: 31.9 mL/min (A) (by C-G formula based on SCr of 1.19 mg/dL (H)). Liver Function Tests: Recent Labs  Lab 04/21/23 2130  AST 19  ALT 14  ALKPHOS 75  BILITOT 0.7  PROT 7.7  ALBUMIN 3.5   Recent Labs  Lab 04/21/23 2130  LIPASE 36   No results for input(s): "AMMONIA" in the last 168 hours. Coagulation Profile: Recent Labs  Lab 04/21/23 2130  INR 1.2   Cardiac Enzymes: No results for input(s): "CKTOTAL", "CKMB", "CKMBINDEX", "TROPONINI" in the last 168 hours. BNP (last 3 results) No results for input(s): "PROBNP" in the last 8760 hours. HbA1C: No results for input(s): "HGBA1C" in the last 72 hours. CBG: No results for input(s): "GLUCAP" in the last 168 hours. Lipid Profile: No results for input(s): "CHOL", "HDL", "LDLCALC", "TRIG", "CHOLHDL", "LDLDIRECT" in the last 72 hours. Thyroid  Function Tests: No results for input(s): "TSH", "T4TOTAL", "FREET4", "T3FREE", "THYROIDAB" in the last 72 hours. Anemia Panel: No results for input(s): "VITAMINB12", "FOLATE", "FERRITIN", "TIBC", "IRON", "RETICCTPCT" in the last 72 hours. Sepsis Labs: Recent Labs  Lab 04/22/23 0058 04/22/23 0244 04/22/23 0502 04/23/23 0242  LATICACIDVEN 2.5* 2.8* 2.3* 0.8    Recent Results (from the past 240 hour(s))  Resp panel by RT-PCR (RSV, Flu A&B, Covid) Anterior Nasal Swab     Status: None   Collection Time: 04/21/23  9:12 PM   Specimen: Anterior Nasal Swab  Result Value Ref Range Status   SARS Coronavirus 2 by RT PCR NEGATIVE NEGATIVE Final   Influenza A by PCR NEGATIVE NEGATIVE Final   Influenza B by PCR NEGATIVE NEGATIVE Final    Comment: (NOTE) The Xpert Xpress SARS-CoV-2/FLU/RSV plus assay is intended as an aid in the diagnosis of influenza from Nasopharyngeal swab specimens and should not be used as a sole basis for treatment. Nasal washings and aspirates are unacceptable for Xpert Xpress SARS-CoV-2/FLU/RSV testing.  Fact Sheet for Patients: BloggerCourse.com  Fact Sheet for Healthcare Providers: SeriousBroker.it  This test is not yet approved or cleared by the [place] FDA and has been authorized for detection and/or diagnosis of SARS-CoV-2 by FDA under an Emergency Use Authorization (EUA). This EUA will remain in effect (meaning this test can be used) for the duration of the COVID-19 declaration under Section 564(b)(1) of the Act, 21 U.S.C. section 360bbb-3(b)(1), unless the authorization is terminated or revoked.     Resp Syncytial Virus by PCR NEGATIVE NEGATIVE Final    Comment: (NOTE) Fact Sheet for Patients: BloggerCourse.com  Fact Sheet for Healthcare Providers: SeriousBroker.it  This test is not yet approved or cleared by the [place] FDA and has been  authorized for detection and/or diagnosis of SARS-CoV-2 by FDA under an Emergency Use Authorization (EUA). This EUA will remain in effect (meaning this test can be used) for the duration of the COVID-19 declaration under Section 564(b)(1) of the Act, 21 U.S.C. section 360bbb-3(b)(1), unless the authorization is terminated or revoked.  Performed at [facility], [address] [place], [place] [address]   Blood Culture (routine x 2)     Status: None (Preliminary result)   Collection Time: 04/21/23  9:30 PM   Specimen: BLOOD  Result Value Ref Range Status   Specimen Description BLOOD BLOOD RIGHT ARM  Final   Special Requests   Final    BOTTLES DRAWN AEROBIC AND ANAEROBIC Blood Culture adequate volume   Culture   Final    NO GROWTH 2 DAYS Performed at [facility], [address], [place], [place] [address]    Report Status PENDING  Incomplete  Blood Culture (routine x 2)     Status: None (Preliminary result)   Collection Time: 04/21/23  9:30 PM   Specimen: BLOOD  Result Value Ref Range Status   Specimen Description BLOOD BLOOD RIGHT HAND  Final   Special Requests   Final    BOTTLES DRAWN AEROBIC ONLY Blood Culture adequate volume   Culture   Final    NO GROWTH 2 DAYS Performed at [facility], [address], [place], [place] [address]    Report Status PENDING  Incomplete         Radiology Studies: DG Chest Portable 1 View  Result Date: 04/22/2023 CLINICAL DATA:  Dyspnea. EXAM: PORTABLE CHEST 1 VIEW COMPARISON:  Portable chest yesterday at 9:46 p.m. FINDINGS: The cardiac size is upper-normal.  No vascular congestion is seen. There is a stable mediastinum with aortic tortuosity and moderate calcific plaque. There is increased elevation of the right diaphragm with atelectatic bands overlying the right hemidiaphragm. No focal pneumonia is evident or pleural collections. The patient is rotated to the right exaggerating a levocurvature of the thoracic spine.  There is overlying monitor wiring. IMPRESSION: 1. Increased elevation of the right diaphragm with atelectatic bands overlying the right hemidiaphragm. Low inspiration. 2. Aortic atherosclerosis. 3. No other change. Electronically Signed   By: Almira Bar M.D.   On: 04/22/2023 04:45   DG Chest Port 1 View  Result Date: 04/21/2023 CLINICAL DATA:  Questionable sepsis - evaluate for abnormality EXAM: PORTABLE CHEST 1 VIEW COMPARISON:  02/25/2020 FINDINGS: Heart and mediastinal contours within normal limits. Low lung volumes with bibasilar atelectasis. No effusions or pneumothorax. No acute bony abnormality. IMPRESSION: Low lung volumes, bibasilar atelectasis. Electronically Signed   By: Charlett Nose M.D.   On: 04/21/2023 22:01        Scheduled Meds:  enoxaparin (LOVENOX) injection  30 mg Subcutaneous Daily   ipratropium-albuterol  3 mL Nebulization Once   Continuous Infusions:  cefTRIAXone (ROCEPHIN)  IV Stopped (04/22/23 2310)     LOS: 1 day    Time spent: 45 minutes spent on chart review, discussion with nursing staff, consultants, updating family and interview/physical exam; more than 50% of that time was spent in counseling and/or coordination of care.    Joseph Art, DO [facility] Available via Epic secure chat 7am-7pm After these hours, please refer to coverage provider listed on [email or web address] 04/23/2023, 10:33 AM

## 2023-04-23 NOTE — Progress Notes (Signed)
Patient was found sitting on the edge of the bed, between the mattress and the frame. Patient had removed her gown and purewick.  I attempted to get the patient back into bed, but she was unable to stand up and she bent down to sit on the floor. She assisted herself to this position. I alerted other staff members and we assessed the patient and found no injuries, scratches, or bruising. We were able to lift the patient back into bed. I asked the patient if she needed to use the bathroom and she told me no.  The patient was put back in a gown, cleaned up, and purewick was not replaced as the daughter stated that the patient prefers to use the bathroom. Bed alarm on, fall mat placed, and patient verbalized no needs.  Daughter was updated on the situation. I educated the daughter to alert [place] when she leaves so we can be more vigilant when daughter is out of the room.

## 2023-04-24 ENCOUNTER — Inpatient Hospital Stay (HOSPITAL_COMMUNITY): Payer: 59

## 2023-04-24 DIAGNOSIS — N390 Urinary tract infection, site not specified: Secondary | ICD-10-CM | POA: Diagnosis not present

## 2023-04-24 DIAGNOSIS — A419 Sepsis, unspecified organism: Secondary | ICD-10-CM | POA: Diagnosis not present

## 2023-04-24 LAB — URINE CULTURE: Culture: NO GROWTH

## 2023-04-24 LAB — CBC
HCT: 34.4 % — ABNORMAL LOW (ref 36.0–46.0)
Hemoglobin: 11 g/dL — ABNORMAL LOW (ref 12.0–15.0)
MCH: 25.6 pg — ABNORMAL LOW (ref 26.0–34.0)
MCHC: 32 g/dL (ref 30.0–36.0)
MCV: 80 fL (ref 80.0–100.0)
Platelets: 183 10*3/uL (ref 150–400)
RBC: 4.3 MIL/uL (ref 3.87–5.11)
RDW: 16.5 % — ABNORMAL HIGH (ref 11.5–15.5)
WBC: 11.9 10*3/uL — ABNORMAL HIGH (ref 4.0–10.5)
nRBC: 0 % (ref 0.0–0.2)

## 2023-04-24 LAB — BASIC METABOLIC PANEL
Anion gap: 8 (ref 5–15)
BUN: 17 mg/dL (ref 8–23)
CO2: 20 mmol/L — ABNORMAL LOW (ref 22–32)
Calcium: 8.2 mg/dL — ABNORMAL LOW (ref 8.9–10.3)
Chloride: 111 mmol/L (ref 98–111)
Creatinine, Ser: 1.11 mg/dL — ABNORMAL HIGH (ref 0.44–1.00)
GFR, Estimated: 51 mL/min — ABNORMAL LOW (ref >60)
Glucose, Bld: 118 mg/dL — ABNORMAL HIGH (ref 70–99)
Potassium: 3.6 mmol/L (ref 3.5–5.1)
Sodium: 139 mmol/L (ref 135–145)

## 2023-04-24 LAB — MAGNESIUM: Magnesium: 2.1 mg/dL (ref 1.7–2.4)

## 2023-04-24 LAB — CULTURE, BLOOD (ROUTINE X 2)
Culture: NO GROWTH
Special Requests: ADEQUATE

## 2023-04-24 MED ORDER — LIDOCAINE 5 % EX PTCH
1.0000 | MEDICATED_PATCH | CUTANEOUS | Status: DC
  Administered 2023-04-24 – 2023-04-27 (×4): 1 via TRANSDERMAL
  Filled 2023-04-24 (×4): qty 1

## 2023-04-24 MED ORDER — QUETIAPINE FUMARATE 25 MG PO TABS
12.5000 mg | ORAL_TABLET | Freq: Every day | ORAL | Status: DC
  Administered 2023-04-24: 12.5 mg via ORAL
  Filled 2023-04-24: qty 1

## 2023-04-24 MED ORDER — DONEPEZIL HCL 10 MG PO TABS
10.0000 mg | ORAL_TABLET | Freq: Every day | ORAL | Status: DC
  Administered 2023-04-24 – 2023-04-26 (×3): 10 mg via ORAL
  Filled 2023-04-24 (×3): qty 1

## 2023-04-24 MED ORDER — ACETAMINOPHEN 500 MG PO TABS
1000.0000 mg | ORAL_TABLET | Freq: Three times a day (TID) | ORAL | Status: DC
  Administered 2023-04-24 – 2023-04-27 (×10): 1000 mg via ORAL
  Filled 2023-04-24 (×10): qty 2

## 2023-04-24 NOTE — Progress Notes (Signed)
PROGRESS NOTE    Katie Jackson  DGL:[id number] DOB: 1946/02/14 DOA: 04/21/2023 PCP: Georgann Housekeeper, MD    Brief Narrative:  77 year old female with history of severe dementia, and hypertension.  She has a history of recurrent UTI.  She lives at home with her daughter.  Today she started running a fever, became weak.  She has recently been dealing with a UTI and came off antibiotics several long ago.  There is no report of nausea and vomiting.  The patient was brought to the ER.   In the ER UA appears infected.  Tmax 100.3.  HR 128, RR up to 29, BP normal.  Lactic acid 1.8=> 2.5 => 2.8.  Patient received IV Rocephin in the ER.  She was also given sepsis IV fluids.   Assessment and Plan: Sepsis due to UTI/lactic acidosis -Blood cultures/urine cultures collected??? Unclear is urine culture actually sent prior to abx so not sure how helpful it will be -Continue IV Rocephin -lactic acid normalized   Low back pain (above sacrum) -lumbar x ray ordered as well as lidocaine patch  Transient acute respiratory distress -While patient in ER, she developed wheezing and appeared to be in respiratory distress.  Repeat chest x-ray done was unrevealing.  Patient given nebulizers and improved. -Wheezing started after patient was given IV Ativan-- avoid ativan in future -added incentive spirometry   Dementia with behavioral disturbance (HCC) -Per daughter, patient sundowns -delirium precuations -Seroquel prn  Hypokalemia -replete  DVT prophylaxis: enoxaparin (LOVENOX) injection 40 mg Start: 04/24/23 1000    Code Status: Full Code Family Communication: at bedside 5/5  Disposition Plan:  Level of care: Med-Surg Status is: Inpatient Remains inpatient appropriate because: needs IV abx    Consultants:  none   Subjective: C/o low back pain  Objective: Vitals:   04/23/23 1550 04/23/23 1944 04/24/23 0634 04/24/23 0831  BP: (!) 154/63 (!) 159/68 (!) 154/79 (!) 126/57  Pulse: 95 (!)  105 91 85  Resp: 18 18 18 18   Temp: 99 F (37.2 C) 98.9 F (37.2 C) 98.6 F (37 C) 97.7 F (36.5 C)  TempSrc: Oral     SpO2: 93% 94% 95% 97%  Weight:      Height:        Intake/Output Summary (Last 24 hours) at 04/24/2023 1107 Last data filed at 04/24/2023 1009 Gross per 24 hour  Intake 880 ml  Output 425 ml  Net 455 ml   Filed Weights   04/22/23 0137  Weight: 61.2 kg    Examination:    General: Appearance:     Overweight female in no acute distress   Tender to palpation above sacrum on both side of spine  Lungs:     respirations unlabored  Heart:    Normal heart rate. Normal rhythm. No murmurs, rubs, or gallops.   MS:   All extremities are intact.   Neurologic:   Awake, alert-- oriented to person       Data Reviewed: I have personally reviewed following labs and imaging studies  CBC: Recent Labs  Lab 04/21/23 2130 04/22/23 0626 04/23/23 0242 04/24/23 0435  WBC 15.5* 12.3* 14.2* 11.9*  NEUTROABS 12.5*  --  11.0*  --   HGB 12.5 9.9* 9.8* 11.0*  HCT 39.2 31.5* 30.8* 34.4*  MCV 79.2* 79.5* 78.6* 80.0  PLT 290 207 226 183   Basic Metabolic Panel: Recent Labs  Lab 04/21/23 2130 04/22/23 0626 04/23/23 0242 04/24/23 0435  NA 135  --  139 139  K 3.8  --  3.3* 3.6  CL 105  --  111 111  CO2 20*  --  22 20*  GLUCOSE 152*  --  109* 118*  BUN 27*  --  19 17  CREATININE 1.59* 1.46* 1.19* 1.11*  CALCIUM 9.2  --  7.9* 8.2*  MG  --  1.7  --  2.1   GFR: Estimated Creatinine Clearance: 34.2 mL/min (A) (by C-G formula based on SCr of 1.11 mg/dL (H)). Liver Function Tests: Recent Labs  Lab 04/21/23 2130  AST 19  ALT 14  ALKPHOS 75  BILITOT 0.7  PROT 7.7  ALBUMIN 3.5   Recent Labs  Lab 04/21/23 2130  LIPASE 36   No results for input(s): "AMMONIA" in the last 168 hours. Coagulation Profile: Recent Labs  Lab 04/21/23 2130  INR 1.2   Cardiac Enzymes: No results for input(s): "CKTOTAL", "CKMB", "CKMBINDEX", "TROPONINI" in the last 168 hours. BNP  (last 3 results) No results for input(s): "PROBNP" in the last 8760 hours. HbA1C: No results for input(s): "HGBA1C" in the last 72 hours. CBG: Recent Labs  Lab 04/23/23 1406  GLUCAP 95   Lipid Profile: No results for input(s): "CHOL", "HDL", "LDLCALC", "TRIG", "CHOLHDL", "LDLDIRECT" in the last 72 hours. Thyroid Function Tests: No results for input(s): "TSH", "T4TOTAL", "FREET4", "T3FREE", "THYROIDAB" in the last 72 hours. Anemia Panel: No results for input(s): "VITAMINB12", "FOLATE", "FERRITIN", "TIBC", "IRON", "RETICCTPCT" in the last 72 hours. Sepsis Labs: Recent Labs  Lab 04/22/23 0058 04/22/23 0244 04/22/23 0502 04/23/23 0242  LATICACIDVEN 2.5* 2.8* 2.3* 0.8    Recent Results (from the past 240 hour(s))  Resp panel by RT-PCR (RSV, Flu A&B, Covid) Anterior Nasal Swab     Status: None   Collection Time: 04/21/23  9:12 PM   Specimen: Anterior Nasal Swab  Result Value Ref Range Status   SARS Coronavirus 2 by RT PCR NEGATIVE NEGATIVE Final   Influenza A by PCR NEGATIVE NEGATIVE Final   Influenza B by PCR NEGATIVE NEGATIVE Final    Comment: (NOTE) The Xpert Xpress SARS-CoV-2/FLU/RSV plus assay is intended as an aid in the diagnosis of influenza from Nasopharyngeal swab specimens and should not be used as a sole basis for treatment. Nasal washings and aspirates are unacceptable for Xpert Xpress SARS-CoV-2/FLU/RSV testing.  Fact Sheet for Patients: BloggerCourse.com  Fact Sheet for Healthcare Providers: SeriousBroker.it  This test is not yet approved or cleared by the [place] FDA and has been authorized for detection and/or diagnosis of SARS-CoV-2 by FDA under an Emergency Use Authorization (EUA). This EUA will remain in effect (meaning this test can be used) for the duration of the COVID-19 declaration under Section 564(b)(1) of the Act, 21 U.S.C. section 360bbb-3(b)(1), unless the authorization is terminated  or revoked.     Resp Syncytial Virus by PCR NEGATIVE NEGATIVE Final    Comment: (NOTE) Fact Sheet for Patients: BloggerCourse.com  Fact Sheet for Healthcare Providers: SeriousBroker.it  This test is not yet approved or cleared by the [place] FDA and has been authorized for detection and/or diagnosis of SARS-CoV-2 by FDA under an Emergency Use Authorization (EUA). This EUA will remain in effect (meaning this test can be used) for the duration of the COVID-19 declaration under Section 564(b)(1) of the Act, 21 U.S.C. section 360bbb-3(b)(1), unless the authorization is terminated or revoked.  Performed at [facility], [address] [place], [place] [address]   Blood Culture (routine x 2)     Status: None (Preliminary  result)   Collection Time: 04/21/23  9:30 PM   Specimen: BLOOD  Result Value Ref Range Status   Specimen Description BLOOD BLOOD RIGHT ARM  Final   Special Requests   Final    BOTTLES DRAWN AEROBIC AND ANAEROBIC Blood Culture adequate volume   Culture   Final    NO GROWTH 3 DAYS Performed at [facility], [address], [place], [place] [address]    Report Status PENDING  Incomplete  Blood Culture (routine x 2)     Status: None (Preliminary result)   Collection Time: 04/21/23  9:30 PM   Specimen: BLOOD  Result Value Ref Range Status   Specimen Description BLOOD BLOOD RIGHT HAND  Final   Special Requests   Final    BOTTLES DRAWN AEROBIC ONLY Blood Culture adequate volume   Culture   Final    NO GROWTH 3 DAYS Performed at [facility], [address], [place], [place] [address]    Report Status PENDING  Incomplete         Radiology Studies: No results found.      Scheduled Meds:  acetaminophen  1,000 mg Oral TID   acidophilus  1 capsule Oral Daily   enoxaparin (LOVENOX) injection  40 mg Subcutaneous Daily   ipratropium-albuterol  3 mL Nebulization Once   lidocaine  1  patch Transdermal Q24H   Continuous Infusions:  cefTRIAXone (ROCEPHIN)  IV Stopped (04/23/23 2320)     LOS: 2 days    Time spent: 45 minutes spent on chart review, discussion with nursing staff, consultants, updating family and interview/physical exam; more than 50% of that time was spent in counseling and/or coordination of care.    Joseph Art, DO [facility] Available via Epic secure chat 7am-7pm After these hours, please refer to coverage provider listed on [email or web address] 04/24/2023, 11:07 AM

## 2023-04-24 NOTE — Progress Notes (Signed)
Speech Language Pathology Treatment: Dysphagia  Patient Details Name: Katie Jackson MRN: [id number] DOB: 26-May-1946 Today's Date: 04/24/2023 Time: 4098-1191 SLP Time Calculation (min) (ACUTE ONLY): 13 min  Assessment / Plan / Recommendation Clinical Impression  Pt was seen for dysphagia treatment. He was alert and cooperative during the session and pt's daughter was present. Pt's daughter reported that the pt typically consumes a regular texture diet with thin liquids. Full dentures were donned for trials with SLP placing the mandibular denture. Pt tolerated puree, regular texture solids and thin liquids via straw without overt s/s of aspiration. Mastication was [facility] and oral clearance was adequate. Pt's daughter reported that the pt's swallow function during this session was representative of her baseline. A regular texture diet with thin liquids is recommended and SLP will follow briefly to ensure tolerance.    HPI HPI: 77 year old female with past medical history significant for dementia presents with her daughter for concern of UTI. Pt was seen for a clinical swallow evaluation in the outpatient setting on 10/20/22 with recommendations for regular solids and thin liquids.      SLP Plan  Continue with current plan of care      Recommendations for follow up therapy are one component of a multi-disciplinary discharge planning process, led by the attending physician.  Recommendations may be updated based on patient status, additional functional criteria and insurance authorization.    Recommendations  Diet recommendations: Regular;Thin liquid Liquids provided via: Cup;Straw Medication Administration: Whole meds with puree Supervision: Staff to assist with self feeding Compensations: Minimize environmental distractions;Slow rate;Small sips/bites Postural Changes and/or Swallow Maneuvers: Seated upright 90 degrees                  Oral care BID     Dysphagia, unspecified  (R13.10)     Continue with current plan of care    Hermina Barnard I. Vear Clock, MS, CCC-SLP Acute Rehabilitation Services Office number [phone]  Scheryl Marten  04/24/2023, 4:04 PM

## 2023-04-24 NOTE — Progress Notes (Signed)
Physical Therapy Treatment Patient Details Name: Katie Jackson MRN: [id number] DOB: 04/20/1946 Today's Date: 04/24/2023   History of Present Illness 77 year old female admitted 5/3 with UTI.   PMH: severe dementia, and hypertension.  She has a history of recurrent UTI.  She lives at home with her granddaughter and daughter.    PT Comments    Patient asleep at start but easily aroused. Pt completed supine>sit with min assist and visual/verbal cues to scoot to EOB. Pt required min assist to stand to RW, Mod assist to guide walker and steps to ambulate to bathroom. Pt able to wash face with cloth while seated on toilet, Total assist for toileting with removal of soiled brief and pericare. Mod assist to return to bed. EOS pt repositioned self back to supine and resting in Lt sidelying with no signs of distress or discomfort. Will continue to progress as able.     Recommendations for follow up therapy are one component of a multi-disciplinary discharge planning process, led by the attending physician.  Recommendations may be updated based on patient status, additional functional criteria and insurance authorization.  Follow Up Recommendations       Assistance Recommended at Discharge Frequent or constant Supervision/Assistance  Patient can return home with the following A lot of help with walking and/or transfers;A lot of help with bathing/dressing/bathroom;Assistance with cooking/housework;Assist for transportation;Help with stairs or ramp for entrance   Equipment Recommendations  None recommended by PT    Recommendations for Other Services       Precautions / Restrictions Precautions Precautions: Fall Restrictions Weight Bearing Restrictions: No     Mobility  Bed Mobility Overal bed mobility: Needs Assistance Bed Mobility: Sidelying to Sit, Sit to Supine   Sidelying to sit: Min assist, HOB elevated   Sit to supine: Min guard   General bed mobility comments:  verbal/visual/tactile cues for hand placement and to intiaite sitting up EOB. Min assist to obtain upirhgt sitting and balance. pt using UE's to scoot to edge. Pt able to return to supine and Lt sidelying at EOS.    Transfers Overall transfer level: Needs assistance Equipment used: Rolling walker (2 wheels) Transfers: Sit to/from Stand Sit to Stand: Min assist           General transfer comment: min assist for power up from EOB and toilet. multimodal cues for hand placement.    Ambulation/Gait Ambulation/Gait assistance: Mod assist Gait Distance (Feet): 25 Feet Assistive device: Rolling walker (2 wheels) Gait Pattern/deviations: Step-through pattern, Decreased step length - right, Decreased step length - left, Decreased stance time - left, Narrow base of support, Trunk flexed, Drifts right/left Gait velocity: decr     General Gait Details: Mod assist to guide steps and walker position. Pt with slight Lt lean with gait and assist to stabilize balance.   Stairs             Wheelchair Mobility    Modified Rankin (Stroke Patients Only)       Balance Overall balance assessment: Needs assistance Sitting-balance support: Feet supported, Single extremity supported, Bilateral upper extremity supported Sitting balance-Leahy Scale: Fair     Standing balance support: Reliant on assistive device for balance, During functional activity, Bilateral upper extremity supported Standing balance-Leahy Scale: Poor                              Cognition Arousal/Alertness: Awake/alert Behavior During Therapy: Flat affect Overall Cognitive Status: History of cognitive  impairments - at baseline                                 General Comments: pt sleeping at start but easily aroused and remained alert during session.        Exercises      General Comments        Pertinent Vitals/Pain Pain Assessment Pain Assessment: PAINAD Breathing:  normal Negative Vocalization: none Facial Expression: smiling or inexpressive Body Language: relaxed Consolability: no need to console PAINAD Score: 0 Pain Intervention(s): Monitored during session, Repositioned    Home Living                          Prior Function            PT Goals (current goals can now be found in the care plan section) Acute Rehab PT Goals Patient Stated Goal: to go home with granddaughter PT Goal Formulation: With patient/family Time For Goal Achievement: 05/07/23 Potential to Achieve Goals: Good Progress towards PT goals: Progressing toward goals    Frequency    Min 3X/week      PT Plan Current plan remains appropriate    Co-evaluation              AM-PAC PT "6 Clicks" Mobility   Outcome Measure  Help needed turning from your back to your side while in a flat bed without using bedrails?: A Little Help needed moving from lying on your back to sitting on the side of a flat bed without using bedrails?: A Little Help needed moving to and from a bed to a chair (including a wheelchair)?: A Little Help needed standing up from a chair using your arms (e.g., wheelchair or bedside chair)?: A Little Help needed to walk in hospital room?: A Little Help needed climbing 3-5 steps with a railing? : Total 6 Click Score: 16    End of Session Equipment Utilized During Treatment: Gait belt Activity Tolerance: Patient tolerated treatment well Patient left: in bed;with call bell/phone within reach;with bed alarm set;with nursing/sitter in room Nurse Communication: Mobility status PT Visit Diagnosis: Muscle weakness (generalized) (M62.81);Pain     Time: 1100-1119 PT Time Calculation (min) (ACUTE ONLY): 19 min  Charges:  $Gait Training: 8-22 mins                     Wynn Maudlin, DPT Acute Rehabilitation Services Office [phone]  04/24/23 12:14 PM

## 2023-04-24 NOTE — NC FL2 (Signed)
[place] MEDICAID [facility] LEVEL OF CARE FORM     IDENTIFICATION  Patient Name: Katie Jackson Birthdate: [birth year] Sex: female Admission Date (Current Location): 04/21/2023  [place] and [place] Number:  Haynes Bast [id number] K Facility and Address:  [facility] [place], [address] [place], [place] [address]      Provider Number: [id number]  Attending Physician Name and Address:  Joseph Art, DO  Relative Name and Phone Number:  Vassie Moselle - daughter - [phone]    Current Level of Care: Hospital Recommended Level of Care: Skilled Nursing Facility Prior Approval Number:    Date Approved/Denied:   PASRR Number: pending  Discharge Plan: SNF    Current Diagnoses: Patient Active Problem List   Diagnosis Date Noted   Acute cystitis 04/22/2023   HTN (hypertension) 04/22/2023   Dementia with behavioral disturbance (HCC) 02/28/2020   Pressure injury of skin 02/26/2020   AKI (acute kidney injury) (HCC) 02/25/2020    Orientation RESPIRATION BLADDER Height & Weight     Self  Normal Incontinent Weight: 61.2 kg Height:  5' (152.4 cm)  BEHAVIORAL SYMPTOMS/MOOD NEUROLOGICAL BOWEL NUTRITION STATUS      Continent Diet (See discharge summary)  AMBULATORY STATUS COMMUNICATION OF NEEDS Skin   Total Care   Normal                       Personal Care Assistance Level of Assistance  Bathing, Feeding, Dressing Bathing Assistance: Maximum assistance Feeding assistance: Limited assistance Dressing Assistance: Maximum assistance     Functional Limitations Info  Sight, Hearing, Speech Sight Info: Adequate Hearing Info: Adequate Speech Info: Impaired (wears glasses)    SPECIAL CARE FACTORS FREQUENCY  PT (By licensed PT), OT (By licensed OT)     PT Frequency: 3-5 x per week OT Frequency: 3-5 x per week            Contractures Contractures Info: Not present    Additional Factors Info  Code Status, Allergies Code Status Info: Full  code Allergies Info: Penicillin           Current Medications (04/24/2023):  This is the current hospital active medication list Current Facility-Administered Medications  Medication Dose Route Frequency Provider Last Rate Last Admin   acetaminophen (TYLENOL) tablet 1,000 mg  1,000 mg Oral TID Vann, Jessica U, DO   1,000 mg at 04/24/23 1529   acidophilus (RISAQUAD) capsule 1 capsule  1 capsule Oral Daily Marlin Canary U, DO   1 capsule at 04/24/23 1032   cefTRIAXone (ROCEPHIN) 2 g in sodium chloride 0.9 % 100 mL IVPB  2 g Intravenous Q24H Marita [place], PA-C   Stopped at 04/23/23 2320   donepezil (ARICEPT) tablet 10 mg  10 mg Oral QHS Vann, Jessica U, DO       enoxaparin (LOVENOX) injection 40 mg  40 mg Subcutaneous Daily Vann, Jessica U, DO   40 mg at 04/24/23 1033   ipratropium-albuterol (DUONEB) 0.5-2.5 (3) MG/3ML nebulizer solution 3 mL  3 mL Nebulization Once Karie Mainland, Amjad, PA-C       lidocaine (LIDODERM) 5 % 1 patch  1 patch Transdermal Q24H Vann, Jessica U, DO   1 patch at 04/24/23 1032   QUEtiapine (SEROQUEL) tablet 12.5 mg  12.5 mg Oral QHS Vann, Jessica U, DO       senna-docusate (Senokot-S) tablet 1 tablet  1 tablet Oral QHS PRN Gery Pray, MD         Discharge Medications: Please see  discharge summary for a list of discharge medications.  Relevant Imaging Results:  Relevant Lab Results:   Additional Information SS# [id number]  Janae Bridgeman, RN

## 2023-04-24 NOTE — TOC Initial Note (Signed)
Transition of Care [facility]) - Initial/Assessment Note    Patient Details  Name: Katie Jackson MRN: [id number] Date of Birth: 07-24-1946  Transition of Care [facility]) CM/SW Contact:    Janae Bridgeman, RN Phone Number: 04/24/2023, 4:40 PM  Clinical Narrative:                 CM met with the patient and daughter at the bedside.  The patient lives at her apartment and the granddaughter provides 24 hour care in the home.  The daughter states that her and her granddaughter have been providing 24 care in the home since 2019 and are no longer able to care for her due to her severe dementia.  Attending MD is aware.  DME at the home at this time include BSC, 3:1, Wheelchair, Medical laboratory scientific officer, Museum/gallery exhibitions officer, Information systems manager.  The patient is confused and unable to state goals.    [place], MSW notified and SNF work up will be started - and pending bed offers by the family.  Expected Discharge Plan: Skilled Nursing Facility Barriers to Discharge: Continued Medical Work up   Patient Goals and CMS Choice Patient states their goals for this hospitalization and ongoing recovery are:: Patient unable to states goals CMS Medicare.gov Compare Post Acute Care list provided to:: Legal Guardian (daughter, Vassie Moselle) Choice offered to / list presented to : [facility] POA / Guardian Spencer ownership interest in [facility].provided to:: [facility] POA / Guardian    Expected Discharge Plan and Services   Discharge Planning Services: CM Consult Post Acute Care Choice: Skilled Nursing Facility Living arrangements for the past 2 months: Apartment                                      Prior Living Arrangements/Services Living arrangements for the past 2 months: Apartment Lives with:: Relatives (Adult granddaughter provides 24 hour care in the apartment) Patient language and need for interpreter reviewed:: Yes Do you feel safe going back to the place where you live?: Yes (Patient's family is no longer able to  provide care in the home - SNF recommended)      Need for Family Participation in Patient Care: Yes (Comment) Care giver support system in place?: Yes (comment) Current home services: DME (BSC, 3:1, wheelchair, RW, Rolator, Shower seat) Criminal Activity/Legal Involvement Pertinent to Current Situation/Hospitalization: No - Comment as needed  Activities of Daily Living Home Assistive Devices/Equipment: Environmental consultant (specify type), Shower chair without back ADL Screening (condition at time of admission) Patient's cognitive ability adequate to safely complete daily activities?: No Is the patient deaf or have difficulty hearing?: No Does the patient have difficulty seeing, even when wearing glasses/contacts?: No Does the patient have difficulty concentrating, remembering, or making decisions?: Yes Patient able to express need for assistance with ADLs?: No Does the patient have difficulty dressing or bathing?: Yes Independently performs ADLs?: No Communication: Needs assistance Is this a change from baseline?: Pre-admission baseline Dressing (OT): Dependent Is this a change from baseline?: Pre-admission baseline Grooming: Needs assistance Is this a change from baseline?: Pre-admission baseline Feeding: Needs assistance Is this a change from baseline?: Pre-admission baseline In/Out Bed: Dependent, Needs assistance Does the patient have difficulty walking or climbing stairs?: Yes Weakness of Legs: Both Weakness of Arms/Hands: Both  Permission Sought/Granted Permission sought to share information with : Case Manager, Family Supports Permission granted to share information with : Yes, Verbal Permission Granted  Permission granted to share info w AGENCY: SNF facility  Permission granted to share info w Relationship: daughter at bedside     Emotional Assessment Appearance:: Appears stated age Attitude/Demeanor/Rapport: Gracious Affect (typically observed): Accepting Orientation: :  Oriented to Self Alcohol / Substance Use: Not Applicable Psych Involvement: No (comment)  Admission diagnosis:  Acute cystitis [N30.00] Urinary tract infection without hematuria, site unspecified [N39.0] Sepsis, due to unspecified organism, unspecified whether acute organ dysfunction present [facility]) [A41.9] Patient Active Problem List   Diagnosis Date Noted   Acute cystitis 04/22/2023   HTN (hypertension) 04/22/2023   Dementia with behavioral disturbance (HCC) 02/28/2020   Pressure injury of skin 02/26/2020   AKI (acute kidney injury) (HCC) 02/25/2020   PCP:  Georgann Housekeeper, MD Pharmacy:   [facility] [address] [place] - [address] [place] [address] Phone: [phone] Fax: [phone]     Social Determinants of Health (SDOH) Social History: SDOH Screenings   Food Insecurity: No Food Insecurity (04/22/2023)  Transportation Needs: No Transportation Needs (04/22/2023)  Utilities: Not At Risk (04/22/2023)  Tobacco Use: Medium Risk (04/21/2023)   SDOH Interventions:     Readmission Risk Interventions    04/24/2023    4:40 PM  Readmission Risk Prevention Plan  Post Dischage Appt Complete  Medication Screening Complete  Transportation Screening Complete

## 2023-04-25 DIAGNOSIS — N390 Urinary tract infection, site not specified: Secondary | ICD-10-CM | POA: Diagnosis not present

## 2023-04-25 LAB — CBC
HCT: 35.9 % — ABNORMAL LOW (ref 36.0–46.0)
Hemoglobin: 11.7 g/dL — ABNORMAL LOW (ref 12.0–15.0)
MCH: 25.2 pg — ABNORMAL LOW (ref 26.0–34.0)
MCHC: 32.6 g/dL (ref 30.0–36.0)
MCV: 77.4 fL — ABNORMAL LOW (ref 80.0–100.0)
Platelets: 269 10*3/uL (ref 150–400)
RBC: 4.64 MIL/uL (ref 3.87–5.11)
RDW: 16.2 % — ABNORMAL HIGH (ref 11.5–15.5)
WBC: 8.6 10*3/uL (ref 4.0–10.5)
nRBC: 0 % (ref 0.0–0.2)

## 2023-04-25 LAB — CULTURE, BLOOD (ROUTINE X 2): Culture: NO GROWTH

## 2023-04-25 MED ORDER — QUETIAPINE FUMARATE 25 MG PO TABS
25.0000 mg | ORAL_TABLET | Freq: Every day | ORAL | Status: DC
  Administered 2023-04-25 – 2023-04-26 (×2): 25 mg via ORAL
  Filled 2023-04-25 (×2): qty 1

## 2023-04-25 NOTE — Plan of Care (Signed)
A/Ox1 to self only. No complaints of pain. Daughter at bedside most of the night and very active in care. Sitter at bedside for safety. Up with walker and 1 person assist to restroom. Continent vs incontinent. Slept for most of the shift after getting night meds.    Problem: Education: Goal: Knowledge of General Education information will improve Description: Including pain rating scale, medication(s)/side effects and non-pharmacologic comfort measures Outcome: Progressing   Problem: Health Behavior/Discharge Planning: Goal: Ability to manage health-related needs will improve Outcome: Progressing   Problem: Clinical Measurements: Goal: Ability to maintain clinical measurements within normal limits will improve Outcome: Progressing Goal: Will remain free from infection Outcome: Progressing Goal: Diagnostic test results will improve Outcome: Progressing Goal: Respiratory complications will improve Outcome: Progressing Goal: Cardiovascular complication will be avoided Outcome: Progressing   Problem: Activity: Goal: Risk for activity intolerance will decrease Outcome: Progressing   Problem: Nutrition: Goal: Adequate nutrition will be maintained Outcome: Progressing   Problem: Coping: Goal: Level of anxiety will decrease Outcome: Progressing   Problem: Elimination: Goal: Will not experience complications related to bowel motility Outcome: Progressing Goal: Will not experience complications related to urinary retention Outcome: Progressing   Problem: Pain Managment: Goal: General experience of comfort will improve Outcome: Progressing   Problem: Safety: Goal: Ability to remain free from injury will improve Outcome: Progressing   Problem: Skin Integrity: Goal: Risk for impaired skin integrity will decrease Outcome: Progressing

## 2023-04-25 NOTE — Care Management Important Message (Signed)
Important Message  Patient Details  Name: Katie Jackson MRN: [id number] Date of Birth: 27-Feb-1946   Medicare Important Message Given:  Yes     Dorena Bodo 04/25/2023, 12:37 PM

## 2023-04-25 NOTE — Progress Notes (Signed)
PROGRESS NOTE    Katie Jackson  WUJ:[id number] DOB: 06-16-1946 DOA: 04/21/2023 PCP: Georgann Housekeeper, MD    Brief Narrative:  77 year old female with history of severe dementia, and hypertension.  She has a history of recurrent UTI.  She lives at home with her daughter.  At home,  she started running a fever, became weak.  Appears to have a UTI, urine cultures not sent until AFTER abx   Assessment and Plan: Sepsis due to UTI/lactic acidosis -Blood cultures/urine cultures collected??? Unclear is urine culture actually sent prior to abx so not sure how helpful it will be -Continue IV Rocephin (can consider change to PO abx to finish course if remains a febrile) -lactic acid normalized   Low back pain (above sacrum) -lumbar x ray w/o revleal - lidocaine patch -tylenol -CBC stable  Transient acute respiratory distress -While patient in ER, she developed wheezing and appeared to be in respiratory distress.  Repeat chest x-ray done was unrevealing.  Patient given nebulizers and improved. -Wheezing started after patient was given IV Ativan-- avoid ativan in future -added incentive spirometry   Dementia with behavioral disturbance (HCC) -Per daughter, patient sundowns -delirium precuations -Seroquel QHS (home dose)  Hypokalemia -replete  DVT prophylaxis: enoxaparin (LOVENOX) injection 40 mg Start: 04/24/23 1000    Code Status: Full Code Family Communication: called daughter, no answer  Disposition Plan:  Level of care: Med-Surg Status is: Inpatient Remains inpatient appropriate because: needs IV abx, currently has sitter will increase seroquel to try to remove sitter    Consultants:  none   Subjective: Still with low back pain-- tender to palpation   Objective: Vitals:   04/24/23 0831 04/24/23 1601 04/25/23 0532 04/25/23 0700  BP: (!) 126/57 (!) 120/58 (!) 157/72 (!) 144/71  Pulse: 85 91 82 74  Resp: 18 18 18 16   Temp: 97.7 F (36.5 C) 97.6 F (36.4 C)     TempSrc:  Oral    SpO2: 97% 96%  98%  Weight:      Height:        Intake/Output Summary (Last 24 hours) at 04/25/2023 1118 Last data filed at 04/25/2023 0900 Gross per 24 hour  Intake 731 ml  Output 1 ml  Net 730 ml   Filed Weights   04/22/23 0137  Weight: 61.2 kg    Examination:     General: Appearance:     Overweight female in no acute distress     Lungs:     respirations unlabored  Heart:    Normal heart rate.   MS:   All extremities are intact.   Neurologic:   Awake, alert- pleasant and cooperative         Data Reviewed: I have personally reviewed following labs and imaging studies  CBC: Recent Labs  Lab 04/21/23 2130 04/22/23 0626 04/23/23 0242 04/24/23 0435 04/25/23 0838  WBC 15.5* 12.3* 14.2* 11.9* 8.6  NEUTROABS 12.5*  --  11.0*  --   --   HGB 12.5 9.9* 9.8* 11.0* 11.7*  HCT 39.2 31.5* 30.8* 34.4* 35.9*  MCV 79.2* 79.5* 78.6* 80.0 77.4*  PLT 290 207 226 183 269   Basic Metabolic Panel: Recent Labs  Lab 04/21/23 2130 04/22/23 0626 04/23/23 0242 04/24/23 0435  NA 135  --  139 139  K 3.8  --  3.3* 3.6  CL 105  --  111 111  CO2 20*  --  22 20*  GLUCOSE 152*  --  109* 118*  BUN 27*  --  19 17  CREATININE 1.59* 1.46* 1.19* 1.11*  CALCIUM 9.2  --  7.9* 8.2*  MG  --  1.7  --  2.1   GFR: Estimated Creatinine Clearance: 34.2 mL/min (A) (by C-G formula based on SCr of 1.11 mg/dL (H)). Liver Function Tests: Recent Labs  Lab 04/21/23 2130  AST 19  ALT 14  ALKPHOS 75  BILITOT 0.7  PROT 7.7  ALBUMIN 3.5   Recent Labs  Lab 04/21/23 2130  LIPASE 36   No results for input(s): "AMMONIA" in the last 168 hours. Coagulation Profile: Recent Labs  Lab 04/21/23 2130  INR 1.2   Cardiac Enzymes: No results for input(s): "CKTOTAL", "CKMB", "CKMBINDEX", "TROPONINI" in the last 168 hours. BNP (last 3 results) No results for input(s): "PROBNP" in the last 8760 hours. HbA1C: No results for input(s): "HGBA1C" in the last 72 hours. CBG: Recent  Labs  Lab 04/23/23 1406  GLUCAP 95   Lipid Profile: No results for input(s): "CHOL", "HDL", "LDLCALC", "TRIG", "CHOLHDL", "LDLDIRECT" in the last 72 hours. Thyroid Function Tests: No results for input(s): "TSH", "T4TOTAL", "FREET4", "T3FREE", "THYROIDAB" in the last 72 hours. Anemia Panel: No results for input(s): "VITAMINB12", "FOLATE", "FERRITIN", "TIBC", "IRON", "RETICCTPCT" in the last 72 hours. Sepsis Labs: Recent Labs  Lab 04/22/23 0058 04/22/23 0244 04/22/23 0502 04/23/23 0242  LATICACIDVEN 2.5* 2.8* 2.3* 0.8    Recent Results (from the past 240 hour(s))  Resp panel by RT-PCR (RSV, Flu A&B, Covid) Anterior Nasal Swab     Status: None   Collection Time: 04/21/23  9:12 PM   Specimen: Anterior Nasal Swab  Result Value Ref Range Status   SARS Coronavirus 2 by RT PCR NEGATIVE NEGATIVE Final   Influenza A by PCR NEGATIVE NEGATIVE Final   Influenza B by PCR NEGATIVE NEGATIVE Final    Comment: (NOTE) The Xpert Xpress SARS-CoV-2/FLU/RSV plus assay is intended as an aid in the diagnosis of influenza from Nasopharyngeal swab specimens and should not be used as a sole basis for treatment. Nasal washings and aspirates are unacceptable for Xpert Xpress SARS-CoV-2/FLU/RSV testing.  Fact Sheet for Patients: BloggerCourse.com  Fact Sheet for Healthcare Providers: SeriousBroker.it  This test is not yet approved or cleared by the [place] FDA and has been authorized for detection and/or diagnosis of SARS-CoV-2 by FDA under an Emergency Use Authorization (EUA). This EUA will remain in effect (meaning this test can be used) for the duration of the COVID-19 declaration under Section 564(b)(1) of the Act, 21 U.S.C. section 360bbb-3(b)(1), unless the authorization is terminated or revoked.     Resp Syncytial Virus by PCR NEGATIVE NEGATIVE Final    Comment: (NOTE) Fact Sheet for  Patients: BloggerCourse.com  Fact Sheet for Healthcare Providers: SeriousBroker.it  This test is not yet approved or cleared by the [place] FDA and has been authorized for detection and/or diagnosis of SARS-CoV-2 by FDA under an Emergency Use Authorization (EUA). This EUA will remain in effect (meaning this test can be used) for the duration of the COVID-19 declaration under Section 564(b)(1) of the Act, 21 U.S.C. section 360bbb-3(b)(1), unless the authorization is terminated or revoked.  Performed at [facility], [address] [place], [place] [address]   Blood Culture (routine x 2)     Status: None (Preliminary result)   Collection Time: 04/21/23  9:30 PM   Specimen: BLOOD  Result Value Ref Range Status   Specimen Description BLOOD BLOOD RIGHT ARM  Final   Special Requests   Final  BOTTLES DRAWN AEROBIC AND ANAEROBIC Blood Culture adequate volume   Culture   Final    NO GROWTH 4 DAYS Performed at [facility], [address], [place], [place] [address]    Report Status PENDING  Incomplete  Blood Culture (routine x 2)     Status: None (Preliminary result)   Collection Time: 04/21/23  9:30 PM   Specimen: BLOOD  Result Value Ref Range Status   Specimen Description BLOOD BLOOD RIGHT HAND  Final   Special Requests   Final    BOTTLES DRAWN AEROBIC ONLY Blood Culture adequate volume   Culture   Final    NO GROWTH 4 DAYS Performed at [facility], [address], [place], [place] [address]    Report Status PENDING  Incomplete  Urine Culture (for pregnant, neutropenic or urologic patients or patients with an indwelling urinary catheter)     Status: None   Collection Time: 04/23/23  3:40 PM   Specimen: Urine, Clean Catch  Result Value Ref Range Status   Specimen Description URINE, CLEAN CATCH  Final   Special Requests NONE  Final   Culture   Final    NO GROWTH Performed at [facility],  [address] [place], [place] [address]    Report Status 04/24/2023 FINAL  Final         Radiology Studies: DG Lumbar Spine 2-3 Views  Result Date: 04/24/2023 CLINICAL DATA:  Low back pain. EXAM: LUMBAR SPINE - 2-3 VIEW COMPARISON:  Lumbar radiograph 08/30/2016 FINDINGS: The bones are subjectively under mineralized. There is grade 1 anterolisthesis of L4 on L5. Mild broad-based dextroscoliotic curvature. Normal vertebral body heights. No acute fracture compression deformity. Mild disc space narrowing and spurring at L5-S1. Diffuse facet hypertrophy, prominent at L4-L5 and L5-S1. No evidence of pars defects or gross focal bone abnormality. Aortic atherosclerosis. IMPRESSION: 1. Facet hypertrophy in the lower lumbar spine with grade 1 anterolisthesis of L4 on L5. 2. Mild degenerative disc disease at L5-S1. Electronically Signed   By: Narda Rutherford M.D.   On: 04/24/2023 15:06        Scheduled Meds:  acetaminophen  1,000 mg Oral TID   acidophilus  1 capsule Oral Daily   donepezil  10 mg Oral QHS   enoxaparin (LOVENOX) injection  40 mg Subcutaneous Daily   ipratropium-albuterol  3 mL Nebulization Once   lidocaine  1 patch Transdermal Q24H   QUEtiapine  12.5 mg Oral QHS   Continuous Infusions:  cefTRIAXone (ROCEPHIN)  IV 2 g (04/24/23 2222)     LOS: 3 days    Time spent: 45 minutes spent on chart review, discussion with nursing staff, consultants, updating family and interview/physical exam; more than 50% of that time was spent in counseling and/or coordination of care.    Joseph Art, DO [facility] Available via Epic secure chat 7am-7pm After these hours, please refer to coverage provider listed on [email or web address] 04/25/2023, 11:18 AM

## 2023-04-25 NOTE — Care Management Important Message (Signed)
Important Message  Patient Details  Name: Valene Loubier MRN: [id number] Date of Birth: 07/08/1946   Medicare Important Message Given:  Yes     Dorena Bodo 04/25/2023, 12:36 PM

## 2023-04-26 DIAGNOSIS — N3000 Acute cystitis without hematuria: Secondary | ICD-10-CM

## 2023-04-26 LAB — CULTURE, BLOOD (ROUTINE X 2)

## 2023-04-26 NOTE — Hospital Course (Signed)
Katie Jackson is a 77 y.o. female with a history of dementia and hypertension. Patient presented secondary to fever and weakness with concern for a UTI and evidence of sepsis. Empiric antibiotics started. Blood and urine cultures with no growth. Patient completed antibiotic course for UTI. Hospitalization complicated by need for placement.

## 2023-04-26 NOTE — Progress Notes (Signed)
Physical Therapy Treatment Patient Details Name: Katie Jackson MRN: [id number] DOB: [birth year] Today's Date: 04/26/2023   History of Present Illness 77 year old female admitted 5/3 with UTI.   PMH: severe dementia, and hypertension.  She has a history of recurrent UTI.  She lives at home with her granddaughter and daughter.    PT Comments    Pt seen for PT tx with pt agreeable with encouragement. Pt is able to complete STS from recliner with supervision & ambulate 3 laps around unit with RW & supervision. Pt without overt LOB during mobility tasks. Pt with minimal verbal communication with PT during session, grunting/making noises in response to PT's questions/statements, only verbalizing "damn" & "hey" repeatedly throughout session. At this time, pt is not appropriate for rehab services as pt is currently ambulatory with RW & supervision, which seems to be better than baseline (per PT evaluation pt was min assist with RW at home prior to admission) & pt with minimal ability to retain education/teaching 2/2 dementia. Pt would benefit from 24 hr supervision at d/c, but is most appropriate for mobility specialists & nursing staff to mobilize pt at this time. Pt will continue to remain a fall risk 2/2 dementia & impaired awareness that impacts functional mobility, thus the recommendation for 24 hr supervision.    Recommendations for follow up therapy are one component of a multi-disciplinary discharge planning process, led by the attending physician.  Recommendations may be updated based on patient status, additional functional criteria and insurance authorization.  Follow Up Recommendations  Can patient physically be transported by private vehicle: Yes    Assistance Recommended at Discharge Frequent or constant Supervision/Assistance  Patient can return home with the following A little help with walking and/or transfers;A little help with bathing/dressing/bathroom;Help with stairs or ramp for  entrance;Direct supervision/assist for medications management;Assist for transportation;Assistance with cooking/housework;Direct supervision/assist for financial management   Equipment Recommendations  Rolling walker (2 wheels)    Recommendations for Other Services       Precautions / Restrictions Precautions Precautions: Fall Restrictions Weight Bearing Restrictions: No     Mobility  Bed Mobility               General bed mobility comments: not tested, pt received in recliner, left standing in room in care of NT    Transfers   Equipment used: None, Rolling walker (2 wheels) Transfers: Sit to/from Stand Sit to Stand: Supervision           General transfer comment: Pt able to transfer STS from recliner without assistance    Ambulation/Gait Ambulation/Gait assistance: Supervision Gait Distance (Feet): 300 Feet Assistive device: Rolling walker (2 wheels) Gait Pattern/deviations: Decreased step length - right, Decreased step length - left, Decreased stride length Gait velocity: decreased     General Gait Details: Pushes RW slightly out in front of her, decreased ability to follow instruction for ambulating within base of AD. No overt LOB noted.   Stairs             Wheelchair Mobility    Modified Rankin (Stroke Patients Only)       Balance Overall balance assessment: Needs assistance Sitting-balance support: Feet supported, Single extremity supported, Bilateral upper extremity supported Sitting balance-Leahy Scale: Good     Standing balance support: During functional activity, Bilateral upper extremity supported Standing balance-Leahy Scale: Fair  Cognition Arousal/Alertness: Awake/alert Behavior During Therapy: Flat affect Overall Cognitive Status: History of cognitive impairments - at baseline                                 General Comments: Pt with hx of dementia. Pt requires  encouragement for participation, poor ability to follow simple commands even with extra time. Pt making noises throughout session in reponse to PT's questions/statements. Pt only verbalizes "damn" and "hey" repeatedly throughout session.        Exercises      General Comments        Pertinent Vitals/Pain Pain Assessment Pain Assessment: Faces Faces Pain Scale: No hurt    Home Living                          Prior Function            PT Goals (current goals can now be found in the care plan section) Acute Rehab PT Goals Patient Stated Goal: pt unable to state PT Goal Formulation: With patient/family Time For Goal Achievement: 05/07/23 Potential to Achieve Goals: Fair Progress towards PT goals: Goals met/education completed, patient discharged from PT    Frequency    Min 2X/week      PT Plan Frequency needs to be updated;Discharge plan needs to be updated    Co-evaluation              AM-PAC PT "6 Clicks" Mobility   Outcome Measure  Help needed turning from your back to your side while in a flat bed without using bedrails?: None Help needed moving from lying on your back to sitting on the side of a flat bed without using bedrails?: A Little Help needed moving to and from a bed to a chair (including a wheelchair)?: A Little Help needed standing up from a chair using your arms (e.g., wheelchair or bedside chair)?: A Little Help needed to walk in hospital room?: A Little Help needed climbing 3-5 steps with a railing? : A Little 6 Click Score: 19    End of Session Equipment Utilized During Treatment: Gait belt Activity Tolerance: Patient tolerated treatment well Patient left:  (standing in room in care of NT) Nurse Communication: Mobility status PT Visit Diagnosis: Muscle weakness (generalized) (M62.81)     Time: 1610-9604 PT Time Calculation (min) (ACUTE ONLY): 17 min  Charges:  $Therapeutic Activity: 8-22 mins                      Aleda Grana, PT, DPT 04/26/23, 11:21 AM   Sandi Mariscal 04/26/2023, 11:18 AM

## 2023-04-26 NOTE — Progress Notes (Signed)
Speech Language Pathology Treatment: Dysphagia  Patient Details Name: Katie Jackson MRN: [id number] DOB: 05/19/1946 Today's Date: 04/26/2023 Time: [id number] SLP Time Calculation (min) (ACUTE ONLY): 10 min  Assessment / Plan / Recommendation Clinical Impression  Pt was seen for dysphagia treatment and was cooperative throughout the session. Pt, nursing, and her sitter reported that the pt has been tolerating the current diet without overt s/s of aspiration. Pt nurse denied any difficulty with meds that were given whole with liquid yesterday. Pt tolerated regular texture solids, and thin liquids via straw using individual and consecutive swallows without symptoms of oropharyngeal dysphagia. It is recommended that the current diet be continued. Further skilled SLP services are not clinically indicated at this time.    HPI HPI: 77 year old female with past medical history significant for dementia presents with her daughter for concern of UTI. Pt was seen for a clinical swallow evaluation in the outpatient setting on 10/20/22 with recommendations for regular solids and thin liquids.      SLP Plan  Continue with current plan of care      Recommendations for follow up therapy are one component of a multi-disciplinary discharge planning process, led by the attending physician.  Recommendations may be updated based on patient status, additional functional criteria and insurance authorization.    Recommendations  Diet recommendations: Regular;Thin liquid Liquids provided via: Cup;Straw Medication Administration: Whole meds with puree Supervision: Staff to assist with self feeding Compensations: Minimize environmental distractions;Slow rate;Small sips/bites Postural Changes and/or Swallow Maneuvers: Seated upright 90 degrees                  Oral care BID     Dysphagia, unspecified (R13.10)     Continue with current plan of care    Katie Jackson I. Vear Clock, MS, CCC-SLP Acute  Rehabilitation Services Office number [phone]  Scheryl Marten  04/26/2023, 9:47 AM

## 2023-04-26 NOTE — Progress Notes (Signed)
PROGRESS NOTE    Katie Jackson  WGN:[id number] DOB: 01-Dec-1946 DOA: 04/21/2023 PCP: Georgann Housekeeper, MD   Brief Narrative: Katie Jackson is a 77 y.o. female with a history of dementia and hypertension. Patient presented secondary to fever and weakness with concern for a UTI and evidence of sepsis. Empiric antibiotics started. Blood and urine cultures with no growth. Patient completed antibiotic course for UTI. Hospitalization complicated by need for placement.   Assessment and Plan:  Sepsis secondary to UTI Present on admission. Blood and urine cultures obtained. Empiric Ceftriaxone started. Culture data with no growth. Patient completed a 5 days course of antibiotics. Resolved.  Back pain Lumbar x-ray imaging without acute pathology. -Continue lidocaine patch  AKI Unclear baseline creatinine. Last creatinine from 20111. Creatinine of 1.59 on admission but has improved to 1.11 with IV fluids. AKI resolved.  Acute respiratory distress Transient while in the ED. Chest x-ray without etiology. Improved with nebulizer treatment. Possibly related to Ativan IV administration.  Dementia Patient with sundowning and impulsivity. Patient is on Seroquel as a home medication, which was increased to 25 mg. -Continue Seroquel -Delirium precautions -Sitter for safety  Hypokalemia Potassium of 3.3. Supplementation given. Hypokalemia resolved.  Chronic anemia Stable.   DVT prophylaxis: Lovenox Code Status:   Code Status: Full Code Family Communication: None at bedside Disposition Plan: Discharge likely to memory care unit once bed is available   Consultants:  None  Procedures:  None  Antimicrobials: Ceftriaxone    Subjective: Patient without specific concerns.  Objective: BP 111/60 (BP Location: Right Arm)   Pulse 91   Temp 98.3 F (36.8 C) (Oral)   Resp 19   Ht 5' (1.524 m)   Wt 61.2 kg   SpO2 96%   BMI 26.37 kg/m   Examination:  General exam:  Appears calm and comfortable Respiratory system: Clear to auscultation. Respiratory effort normal. Cardiovascular system: S1 & S2 heard, RRR. No murmurs, rubs, gallops or clicks. Gastrointestinal system: Abdomen is nondistended, soft and nontender. Normal bowel sounds heard. Central nervous system: Alert and oriented to person. Musculoskeletal: No edema. No calf tenderness    Data Reviewed: I have personally reviewed following labs and imaging studies  CBC Lab Results  Component Value Date   WBC 8.6 04/25/2023   RBC 4.64 04/25/2023   HGB 11.7 (L) 04/25/2023   HCT 35.9 (L) 04/25/2023   MCV 77.4 (L) 04/25/2023   MCH 25.2 (L) 04/25/2023   PLT 269 04/25/2023   MCHC 32.6 04/25/2023   RDW 16.2 (H) 04/25/2023   LYMPHSABS 1.9 04/23/2023   MONOABS 1.0 04/23/2023   EOSABS 0.1 04/23/2023   BASOSABS 0.1 04/23/2023     Last metabolic panel Lab Results  Component Value Date   NA 139 04/24/2023   K 3.6 04/24/2023   CL 111 04/24/2023   CO2 20 (L) 04/24/2023   BUN 17 04/24/2023   CREATININE 1.11 (H) 04/24/2023   GLUCOSE 118 (H) 04/24/2023   GFRNONAA 51 (L) 04/24/2023   GFRAA 42 (L) 03/03/2020   CALCIUM 8.2 (L) 04/24/2023   PHOS 3.7 02/26/2020   PROT 7.7 04/21/2023   ALBUMIN 3.5 04/21/2023   BILITOT 0.7 04/21/2023   ALKPHOS 75 04/21/2023   AST 19 04/21/2023   ALT 14 04/21/2023   ANIONGAP 8 04/24/2023    GFR: Estimated Creatinine Clearance: 34.2 mL/min (A) (by C-G formula based on SCr of 1.11 mg/dL (H)).  Recent Results (from the past 240 hour(s))  Resp panel by RT-PCR (RSV, Flu A&B,  Covid) Anterior Nasal Swab     Status: None   Collection Time: 04/21/23  9:12 PM   Specimen: Anterior Nasal Swab  Result Value Ref Range Status   SARS Coronavirus 2 by RT PCR NEGATIVE NEGATIVE Final   Influenza A by PCR NEGATIVE NEGATIVE Final   Influenza B by PCR NEGATIVE NEGATIVE Final    Comment: (NOTE) The Xpert Xpress SARS-CoV-2/FLU/RSV plus assay is intended as an aid in the diagnosis  of influenza from Nasopharyngeal swab specimens and should not be used as a sole basis for treatment. Nasal washings and aspirates are unacceptable for Xpert Xpress SARS-CoV-2/FLU/RSV testing.  Fact Sheet for Patients: BloggerCourse.com  Fact Sheet for Healthcare Providers: SeriousBroker.it  This test is not yet approved or cleared by the [place] FDA and has been authorized for detection and/or diagnosis of SARS-CoV-2 by FDA under an Emergency Use Authorization (EUA). This EUA will remain in effect (meaning this test can be used) for the duration of the COVID-19 declaration under Section 564(b)(1) of the Act, 21 U.S.C. section 360bbb-3(b)(1), unless the authorization is terminated or revoked.     Resp Syncytial Virus by PCR NEGATIVE NEGATIVE Final    Comment: (NOTE) Fact Sheet for Patients: BloggerCourse.com  Fact Sheet for Healthcare Providers: SeriousBroker.it  This test is not yet approved or cleared by the [place] FDA and has been authorized for detection and/or diagnosis of SARS-CoV-2 by FDA under an Emergency Use Authorization (EUA). This EUA will remain in effect (meaning this test can be used) for the duration of the COVID-19 declaration under Section 564(b)(1) of the Act, 21 U.S.C. section 360bbb-3(b)(1), unless the authorization is terminated or revoked.  Performed at [facility], [address] [place], [place] [address]   Blood Culture (routine x 2)     Status: None   Collection Time: 04/21/23  9:30 PM   Specimen: BLOOD  Result Value Ref Range Status   Specimen Description BLOOD BLOOD RIGHT ARM  Final   Special Requests   Final    BOTTLES DRAWN AEROBIC AND ANAEROBIC Blood Culture adequate volume   Culture   Final    NO GROWTH 5 DAYS Performed at [facility], [address], [place], [place] [address]    Report Status 04/26/2023  FINAL  Final  Blood Culture (routine x 2)     Status: None   Collection Time: 04/21/23  9:30 PM   Specimen: BLOOD  Result Value Ref Range Status   Specimen Description BLOOD BLOOD RIGHT HAND  Final   Special Requests   Final    BOTTLES DRAWN AEROBIC ONLY Blood Culture adequate volume   Culture   Final    NO GROWTH 5 DAYS Performed at [facility], [address], [place], [place] [address]    Report Status 04/26/2023 FINAL  Final  Urine Culture (for pregnant, neutropenic or urologic patients or patients with an indwelling urinary catheter)     Status: None   Collection Time: 04/23/23  3:40 PM   Specimen: Urine, Clean Catch  Result Value Ref Range Status   Specimen Description URINE, CLEAN CATCH  Final   Special Requests NONE  Final   Culture   Final    NO GROWTH Performed at [facility], [address], [place], [place] [address]    Report Status 04/24/2023 FINAL  Final      Radiology Studies: No results found.    LOS: 4 days    Jacquelin Hawking,  MD [facility] 04/26/2023, 5:34 PM   If 7PM-7AM, please contact night-coverage [email or web address]

## 2023-04-27 DIAGNOSIS — N3000 Acute cystitis without hematuria: Secondary | ICD-10-CM | POA: Diagnosis not present

## 2023-04-27 MED ORDER — SODIUM CHLORIDE 0.9 % IV SOLN: 2.0000 g | Freq: Once | INTRAVENOUS | Status: DC

## 2023-04-27 MED ORDER — SODIUM CHLORIDE 0.9 % IV SOLN
2.0000 g | Freq: Once | INTRAVENOUS | Status: AC
  Administered 2023-04-27: 2 g via INTRAVENOUS
  Filled 2023-04-27: qty 20

## 2023-04-27 MED ORDER — QUETIAPINE FUMARATE 25 MG PO TABS: 25.0000 mg | ORAL_TABLET | Freq: Every day | ORAL | 2 refills | Status: DC

## 2023-04-27 NOTE — Discharge Summary (Signed)
Physician Discharge Summary   Patient: Katie Jackson MRN: [id number] DOB: 1946-01-20  Admit date:     04/21/2023  Discharge date: 04/27/23  Discharge Physician: Jacquelin Hawking, MD   PCP: Georgann Housekeeper, MD   Recommendations at discharge:  PCP follow-up to help with transition to memory care unit  Discharge Diagnoses: Principal Problem:   Acute cystitis Active Problems:   Dementia with behavioral disturbance (HCC)   HTN (hypertension)  Resolved Problems:   * No resolved hospital problems. *  Hospital Course: Katie Jackson is a 77 y.o. female with a history of dementia and hypertension. Patient presented secondary to fever and weakness with concern for a UTI and evidence of sepsis. Empiric antibiotics started. Blood and urine cultures with no growth. Patient completed antibiotic course for UTI. Hospitalization complicated by need for placement.  Assessment and Plan:  Sepsis secondary to UTI Present on admission. Blood and urine cultures obtained. Empiric Ceftriaxone started. Culture data with no growth. Patient completed a 7 days course of antibiotics. Resolved.   Back pain Lumbar x-ray imaging without acute pathology.   AKI Unclear baseline creatinine. Last creatinine from 20111. Creatinine of 1.59 on admission but has improved to 1.11 with IV fluids. AKI resolved.   Acute respiratory distress Transient while in the ED. Chest x-ray without etiology. Improved with nebulizer treatment. Possibly related to Ativan IV administration.   Dementia Patient with sundowning and impulsivity. Patient is on Seroquel as a home medication, which was increased to 25 mg. Continue Seroquel 25 mg qHS on discharge. Family to pursue memory care placement.   Hypokalemia Potassium of 3.3. Supplementation given. Hypokalemia resolved.   Chronic anemia Stable.   Consultants: None Procedures performed: None  Disposition: Home Diet recommendation: Regular diet   DISCHARGE  MEDICATION: Allergies as of 04/27/2023       Reactions   Penicillins Other (See Comments)   From childhood; reaction not recalled.        Medication List     STOP taking these medications    amLODipine 10 MG tablet Commonly known as: NORVASC       TAKE these medications    CALCIUM 600/VITAMIN D3 PO Take 1 tablet by mouth 2 (two) times daily with a meal.   donepezil 10 MG tablet Commonly known as: ARICEPT Take 1 tablet (10 mg total) by mouth at bedtime.   lovastatin 20 MG tablet Commonly known as: MEVACOR Take 20 mg by mouth at bedtime.   QUEtiapine 25 MG tablet Commonly known as: SEROQUEL Take 1 tablet (25 mg total) by mouth at bedtime. What changed: how much to take        Discharge Exam: BP 134/64 (BP Location: Right Arm)   Pulse 87   Temp 98 F (36.7 C) (Oral)   Resp 16   Ht 5' (1.524 m)   Wt 61.2 kg   SpO2 97%   BMI 26.37 kg/m   General exam: Appears calm and comfortable Respiratory system: Clear to auscultation. Respiratory effort normal. Cardiovascular system: S1 & S2 heard, RRR. Gastrointestinal system: Abdomen is nondistended, soft and nontender. Normal bowel sounds heard. Central nervous system: Alert.  Condition at discharge: stable  The results of significant diagnostics from this hospitalization (including imaging, microbiology, ancillary and laboratory) are listed below for reference.   Imaging Studies: DG Lumbar Spine 2-3 Views  Result Date: 04/24/2023 CLINICAL DATA:  Low back pain. EXAM: LUMBAR SPINE - 2-3 VIEW COMPARISON:  Lumbar radiograph 08/30/2016 FINDINGS: The bones are subjectively under mineralized. There  is grade 1 anterolisthesis of L4 on L5. Mild broad-based dextroscoliotic curvature. Normal vertebral body heights. No acute fracture compression deformity. Mild disc space narrowing and spurring at L5-S1. Diffuse facet hypertrophy, prominent at L4-L5 and L5-S1. No evidence of pars defects or gross focal bone abnormality. Aortic  atherosclerosis. IMPRESSION: 1. Facet hypertrophy in the lower lumbar spine with grade 1 anterolisthesis of L4 on L5. 2. Mild degenerative disc disease at L5-S1. Electronically Signed   By: Narda Rutherford M.D.   On: 04/24/2023 15:06   DG Chest Portable 1 View  Result Date: 04/22/2023 CLINICAL DATA:  Dyspnea. EXAM: PORTABLE CHEST 1 VIEW COMPARISON:  Portable chest yesterday at 9:46 p.m. FINDINGS: The cardiac size is upper-normal.  No vascular congestion is seen. There is a stable mediastinum with aortic tortuosity and moderate calcific plaque. There is increased elevation of the right diaphragm with atelectatic bands overlying the right hemidiaphragm. No focal pneumonia is evident or pleural collections. The patient is rotated to the right exaggerating a levocurvature of the thoracic spine. There is overlying monitor wiring. IMPRESSION: 1. Increased elevation of the right diaphragm with atelectatic bands overlying the right hemidiaphragm. Low inspiration. 2. Aortic atherosclerosis. 3. No other change. Electronically Signed   By: Almira Bar M.D.   On: 04/22/2023 04:45   DG Chest Port 1 View  Result Date: 04/21/2023 CLINICAL DATA:  Questionable sepsis - evaluate for abnormality EXAM: PORTABLE CHEST 1 VIEW COMPARISON:  02/25/2020 FINDINGS: Heart and mediastinal contours within normal limits. Low lung volumes with bibasilar atelectasis. No effusions or pneumothorax. No acute bony abnormality. IMPRESSION: Low lung volumes, bibasilar atelectasis. Electronically Signed   By: Charlett Nose M.D.   On: 04/21/2023 22:01   MR PELVIS W WO CONTRAST  Result Date: 04/17/2023 CLINICAL DATA:  Reported palpable area of clinical concern within the upper right buttock EXAM: MRI PELVIS WITHOUT AND WITH CONTRAST TECHNIQUE: Multiplanar multisequence MR imaging of the pelvis was performed both before and after administration of intravenous contrast. CONTRAST:  7.24mL GADAVIST GADOBUTROL 1 MMOL/ML IV SOLN COMPARISON:  Ultrasound  pelvis limited dated 12/22/2022 FINDINGS: Urinary Tract:  No abnormality visualized. Bowel:  Colonic diverticulosis without acute diverticulitis. Vascular/Lymphatic: No pathologically enlarged lymph nodes. No significant vascular abnormality seen. Reproductive:  No adnexal mass. Other:  None. Musculoskeletal: Degenerative changes of the right hip with asymmetrically increased small right joint effusion. 6 mm T2 hyperintense subcutaneous signal overlying the right gluteal region (7:22) is associated with ill-defined enhancement, likely a superficial vessel. IMPRESSION: 1. A 6 mm focus in the subcutaneous right gluteal region, likely a superficial vessel. Otherwise, no evidence of focal lesion or signal abnormality. 2. Degenerative changes of the right hip with asymmetrically increased small right joint effusion. Electronically Signed   By: Agustin Cree M.D.   On: 04/17/2023 14:16    Microbiology: Results for orders placed or performed during the hospital encounter of 04/21/23  Resp panel by RT-PCR (RSV, Flu A&B, Covid) Anterior Nasal Swab     Status: None   Collection Time: 04/21/23  9:12 PM   Specimen: Anterior Nasal Swab  Result Value Ref Range Status   SARS Coronavirus 2 by RT PCR NEGATIVE NEGATIVE Final   Influenza A by PCR NEGATIVE NEGATIVE Final   Influenza B by PCR NEGATIVE NEGATIVE Final    Comment: (NOTE) The Xpert Xpress SARS-CoV-2/FLU/RSV plus assay is intended as an aid in the diagnosis of influenza from Nasopharyngeal swab specimens and should not be used as a sole basis for treatment. Nasal washings and  aspirates are unacceptable for Xpert Xpress SARS-CoV-2/FLU/RSV testing.  Fact Sheet for Patients: BloggerCourse.com  Fact Sheet for Healthcare Providers: SeriousBroker.it  This test is not yet approved or cleared by the [place] FDA and has been authorized for detection and/or diagnosis of SARS-CoV-2 by FDA under an Emergency  Use Authorization (EUA). This EUA will remain in effect (meaning this test can be used) for the duration of the COVID-19 declaration under Section 564(b)(1) of the Act, 21 U.S.C. section 360bbb-3(b)(1), unless the authorization is terminated or revoked.     Resp Syncytial Virus by PCR NEGATIVE NEGATIVE Final    Comment: (NOTE) Fact Sheet for Patients: BloggerCourse.com  Fact Sheet for Healthcare Providers: SeriousBroker.it  This test is not yet approved or cleared by the [place] FDA and has been authorized for detection and/or diagnosis of SARS-CoV-2 by FDA under an Emergency Use Authorization (EUA). This EUA will remain in effect (meaning this test can be used) for the duration of the COVID-19 declaration under Section 564(b)(1) of the Act, 21 U.S.C. section 360bbb-3(b)(1), unless the authorization is terminated or revoked.  Performed at [facility], [address], [place], [place] [address]   Blood Culture (routine x 2)     Status: None   Collection Time: 04/21/23  9:30 PM   Specimen: BLOOD  Result Value Ref Range Status   Specimen Description BLOOD BLOOD RIGHT ARM  Final   Special Requests   Final    BOTTLES DRAWN AEROBIC AND ANAEROBIC Blood Culture adequate volume   Culture   Final    NO GROWTH 5 DAYS Performed at [facility], [address], [place], [place] [address]    Report Status 04/26/2023 FINAL  Final  Blood Culture (routine x 2)     Status: None   Collection Time: 04/21/23  9:30 PM   Specimen: BLOOD  Result Value Ref Range Status   Specimen Description BLOOD BLOOD RIGHT HAND  Final   Special Requests   Final    BOTTLES DRAWN AEROBIC ONLY Blood Culture adequate volume   Culture   Final    NO GROWTH 5 DAYS Performed at [facility], [address], [place], [place] [address]    Report Status 04/26/2023 FINAL  Final  Urine Culture (for pregnant, neutropenic or urologic patients or  patients with an indwelling urinary catheter)     Status: None   Collection Time: 04/23/23  3:40 PM   Specimen: Urine, Clean Catch  Result Value Ref Range Status   Specimen Description URINE, CLEAN CATCH  Final   Special Requests NONE  Final   Culture   Final    NO GROWTH Performed at [facility], [address] [place], [place] [address]    Report Status 04/24/2023 FINAL  Final    Labs: CBC: Recent Labs  Lab 04/21/23 2130 04/22/23 0626 04/23/23 0242 04/24/23 0435 04/25/23 0838  WBC 15.5* 12.3* 14.2* 11.9* 8.6  NEUTROABS 12.5*  --  11.0*  --   --   HGB 12.5 9.9* 9.8* 11.0* 11.7*  HCT 39.2 31.5* 30.8* 34.4* 35.9*  MCV 79.2* 79.5* 78.6* 80.0 77.4*  PLT 290 207 226 183 269   Basic Metabolic Panel: Recent Labs  Lab 04/21/23 2130 04/22/23 0626 04/23/23 0242 04/24/23 0435  NA 135  --  139 139  K 3.8  --  3.3* 3.6  CL 105  --  111 111  CO2 20*  --  22 20*  GLUCOSE 152*  --  109* 118*  BUN 27*  --  19 17  CREATININE 1.59* 1.46* 1.19* 1.11*  CALCIUM 9.2  --  7.9* 8.2*  MG  --  1.7  --  2.1   Liver Function Tests: Recent Labs  Lab 04/21/23 2130  AST 19  ALT 14  ALKPHOS 75  BILITOT 0.7  PROT 7.7  ALBUMIN 3.5   CBG: Recent Labs  Lab 04/23/23 1406  GLUCAP 95    Discharge time spent: 35 minutes.  Signed: Jacquelin Hawking, MD [facility] 04/27/2023

## 2023-04-27 NOTE — NC FL2 (Signed)
[place] MEDICAID [facility] LEVEL OF CARE FORM     IDENTIFICATION  Patient Name: Katie Jackson Birthdate: [birth year] Sex: female Admission Date (Current Location): 04/21/2023  [place] and [place] Number:  Haynes Bast [id number] K Facility and Address:  [facility] [place], [address], [place], [place] [address]      Provider Number: [id number]  Attending Physician Name and Address:  Narda Bonds, MD  Relative Name and Phone Number:  Vassie Moselle (Daughter)  [phone]    Current Level of Care: Hospital Recommended Level of Care: Memory Care Prior Approval Number:    Date Approved/Denied:   PASRR Number: [id number]  Discharge Plan: Other (Comment) (Pt to return home until memory care facility can be found)    Current Diagnoses: Patient Active Problem List   Diagnosis Date Noted   Acute cystitis 04/22/2023   HTN (hypertension) 04/22/2023   Dementia with behavioral disturbance (HCC) 02/28/2020   Pressure injury of skin 02/26/2020   AKI (acute kidney injury) (HCC) 02/25/2020    Orientation RESPIRATION BLADDER Height & Weight     Self  Normal Continent Weight: 135 lb (61.2 kg) Height:  5' (152.4 cm)  BEHAVIORAL SYMPTOMS/MOOD NEUROLOGICAL BOWEL NUTRITION STATUS      Continent (occasional incontinence) Diet (See discharge summary)  AMBULATORY STATUS COMMUNICATION OF NEEDS Skin   Supervision Verbally Normal                       Personal Care Assistance Level of Assistance  Bathing, Feeding, Dressing Bathing Assistance: Limited assistance Feeding assistance: Independent Dressing Assistance: Limited assistance     Functional Limitations Info  Sight, Hearing, Speech Sight Info: Impaired (wears glasses) Hearing Info: Adequate Speech Info: Adequate    SPECIAL CARE FACTORS FREQUENCY                       Contractures Contractures Info: Not present    Additional Factors Info  Code Status, Allergies Code Status Info: Full  code Allergies Info: Penicillin           Current Medications (04/27/2023):  This is the current hospital active medication list Current Facility-Administered Medications  Medication Dose Route Frequency Provider Last Rate Last Admin   acetaminophen (TYLENOL) tablet 1,000 mg  1,000 mg Oral TID Benjamine Mola, Jessica U, DO   1,000 mg at 04/26/23 2339   acidophilus (RISAQUAD) capsule 1 capsule  1 capsule Oral Daily Marlin Canary U, DO   1 capsule at 04/26/23 1129   cefTRIAXone (ROCEPHIN) 2 g in sodium chloride 0.9 % 100 mL IVPB  2 g Intravenous Q24H Karie Mainland, Amjad, PA-C 200 mL/hr at 04/26/23 2331 2 g at 04/26/23 2331   donepezil (ARICEPT) tablet 10 mg  10 mg Oral QHS Marlin Canary U, DO   10 mg at 04/26/23 2339   enoxaparin (LOVENOX) injection 40 mg  40 mg Subcutaneous Daily Vann, Jessica U, DO   40 mg at 04/26/23 1129   ipratropium-albuterol (DUONEB) 0.5-2.5 (3) MG/3ML nebulizer solution 3 mL  3 mL Nebulization Once Karie Mainland, Amjad, PA-C       lidocaine (LIDODERM) 5 % 1 patch  1 patch Transdermal Q24H Marlin Canary U, DO   1 patch at 04/26/23 1129   QUEtiapine (SEROQUEL) tablet 25 mg  25 mg Oral QHS Vann, Jessica U, DO   25 mg at 04/26/23 2339   senna-docusate (Senokot-S) tablet 1 tablet  1 tablet Oral QHS PRN Gery Pray, MD  Discharge Medications: Please see discharge summary for a list of discharge medications.  Relevant Imaging Results:  Relevant Lab Results:   Additional Information SS# [id number]  Smantha Boakye A [place], [place]

## 2023-04-27 NOTE — TOC Progression Note (Addendum)
Transition of Care [facility]) - Progression Note    Patient Details  Name: Katie Jackson MRN: [id number] Date of Birth: [birth year]  Transition of Care [facility]) CM/SW Contact  Inna Tisdell A [place], [place] Phone Number: 04/27/2023, 10:50 AM  Clinical Narrative:    CSW contacted the pt's daughter, Bjorn Loser, regarding update to discharge plan and level of care recommendation.   CSW informed her the SNF is no longer recommended due to pt being able to ambulate well. Recommendation for pt to find long term care at a memory care facility.   CSW will provide resources for [facility], [employer] to assist pt and family in finding an appropriate level of care in the area.   Pt will return home and family can begin process of finding memory care facility for pt as the long term goal.   Bjorn Loser said she or another family member will be able to provide transportation at discharge but will have to wait till she is off of work.   Pt has all needed DME equipment at home.   CSW will notify pt's nurse regarding transportation.    Expected Discharge Plan: Skilled Nursing Facility Barriers to Discharge: Continued Medical Work up  Expected Discharge Plan and Services   Discharge Planning Services: CM Consult Post Acute Care Choice: Skilled Nursing Facility Living arrangements for the past 2 months: Apartment                                       Social Determinants of Health (SDOH) Interventions SDOH Screenings   Food Insecurity: No Food Insecurity (04/22/2023)  Transportation Needs: No Transportation Needs (04/22/2023)  Utilities: Not At Risk (04/22/2023)  Tobacco Use: Medium Risk (04/21/2023)    Readmission Risk Interventions    04/24/2023    4:40 PM  Readmission Risk Prevention Plan  Post Dischage Appt Complete  Medication Screening Complete  Transportation Screening Complete

## 2023-04-27 NOTE — Progress Notes (Signed)
Pt leaving 2W unit via wheelchair with NT, Kayla. Pt in no visual signs of distress and showing great signs of euphoria.

## 2023-05-09 ENCOUNTER — Other Ambulatory Visit: Payer: Self-pay | Admitting: Physician Assistant

## 2023-06-11 ENCOUNTER — Other Ambulatory Visit: Payer: Self-pay

## 2023-06-11 ENCOUNTER — Emergency Department (HOSPITAL_COMMUNITY): Payer: 59

## 2023-06-11 ENCOUNTER — Inpatient Hospital Stay (HOSPITAL_COMMUNITY)
Admission: EM | Admit: 2023-06-11 | Discharge: 2023-06-16 | DRG: 689 | Disposition: A | Discharge status: Home Health | Payer: 59 | Attending: Internal Medicine | Admitting: Internal Medicine

## 2023-06-11 DIAGNOSIS — E860 Dehydration: Secondary | ICD-10-CM | POA: Diagnosis present

## 2023-06-11 DIAGNOSIS — Z87891 Personal history of nicotine dependence: Secondary | ICD-10-CM

## 2023-06-11 DIAGNOSIS — Z880 Allergy status to penicillin: Secondary | ICD-10-CM

## 2023-06-11 DIAGNOSIS — R404 Transient alteration of awareness: Secondary | ICD-10-CM

## 2023-06-11 DIAGNOSIS — D509 Iron deficiency anemia, unspecified: Secondary | ICD-10-CM | POA: Diagnosis present

## 2023-06-11 DIAGNOSIS — N179 Acute kidney failure, unspecified: Secondary | ICD-10-CM | POA: Diagnosis present

## 2023-06-11 DIAGNOSIS — J449 Chronic obstructive pulmonary disease, unspecified: Secondary | ICD-10-CM | POA: Diagnosis present

## 2023-06-11 DIAGNOSIS — N390 Urinary tract infection, site not specified: Secondary | ICD-10-CM | POA: Diagnosis not present

## 2023-06-11 DIAGNOSIS — Z86008 Personal history of in-situ neoplasm of other site: Secondary | ICD-10-CM | POA: Diagnosis not present

## 2023-06-11 DIAGNOSIS — E876 Hypokalemia: Secondary | ICD-10-CM | POA: Diagnosis not present

## 2023-06-11 DIAGNOSIS — N1831 Chronic kidney disease, stage 3a: Secondary | ICD-10-CM | POA: Diagnosis present

## 2023-06-11 DIAGNOSIS — Z741 Need for assistance with personal care: Secondary | ICD-10-CM | POA: Diagnosis not present

## 2023-06-11 DIAGNOSIS — I6523 Occlusion and stenosis of bilateral carotid arteries: Secondary | ICD-10-CM | POA: Diagnosis not present

## 2023-06-11 DIAGNOSIS — F02818 Dementia in other diseases classified elsewhere, unspecified severity, with other behavioral disturbance: Secondary | ICD-10-CM | POA: Diagnosis present

## 2023-06-11 DIAGNOSIS — I129 Hypertensive chronic kidney disease with stage 1 through stage 4 chronic kidney disease, or unspecified chronic kidney disease: Secondary | ICD-10-CM | POA: Diagnosis not present

## 2023-06-11 DIAGNOSIS — G309 Alzheimer's disease, unspecified: Secondary | ICD-10-CM | POA: Diagnosis present

## 2023-06-11 DIAGNOSIS — G9340 Encephalopathy, unspecified: Secondary | ICD-10-CM | POA: Diagnosis present

## 2023-06-11 DIAGNOSIS — Z79899 Other long term (current) drug therapy: Secondary | ICD-10-CM | POA: Diagnosis not present

## 2023-06-11 DIAGNOSIS — F03918 Unspecified dementia, unspecified severity, with other behavioral disturbance: Secondary | ICD-10-CM | POA: Diagnosis present

## 2023-06-11 DIAGNOSIS — E7800 Pure hypercholesterolemia, unspecified: Secondary | ICD-10-CM | POA: Diagnosis present

## 2023-06-11 DIAGNOSIS — L89151 Pressure ulcer of sacral region, stage 1: Secondary | ICD-10-CM | POA: Diagnosis present

## 2023-06-11 DIAGNOSIS — F05 Delirium due to known physiological condition: Secondary | ICD-10-CM | POA: Diagnosis not present

## 2023-06-11 DIAGNOSIS — X30XXXA Exposure to excessive natural heat, initial encounter: Secondary | ICD-10-CM

## 2023-06-11 DIAGNOSIS — M810 Age-related osteoporosis without current pathological fracture: Secondary | ICD-10-CM | POA: Diagnosis not present

## 2023-06-11 DIAGNOSIS — Z87440 Personal history of urinary (tract) infections: Secondary | ICD-10-CM

## 2023-06-11 DIAGNOSIS — G9341 Metabolic encephalopathy: Secondary | ICD-10-CM | POA: Diagnosis not present

## 2023-06-11 DIAGNOSIS — T675XXA Heat exhaustion, unspecified, initial encounter: Secondary | ICD-10-CM | POA: Diagnosis not present

## 2023-06-11 DIAGNOSIS — Z743 Need for continuous supervision: Secondary | ICD-10-CM | POA: Diagnosis not present

## 2023-06-11 DIAGNOSIS — R6889 Other general symptoms and signs: Secondary | ICD-10-CM | POA: Diagnosis not present

## 2023-06-11 DIAGNOSIS — I499 Cardiac arrhythmia, unspecified: Secondary | ICD-10-CM | POA: Diagnosis not present

## 2023-06-11 LAB — COMPREHENSIVE METABOLIC PANEL
ALT: 9 U/L (ref 0–44)
AST: 14 U/L — ABNORMAL LOW (ref 15–41)
Albumin: 3.3 g/dL — ABNORMAL LOW (ref 3.5–5.0)
Alkaline Phosphatase: 68 U/L (ref 38–126)
Anion gap: 6 (ref 5–15)
BUN: 19 mg/dL (ref 8–23)
CO2: 23 mmol/L (ref 22–32)
Calcium: 8.4 mg/dL — ABNORMAL LOW (ref 8.9–10.3)
Chloride: 109 mmol/L (ref 98–111)
Creatinine, Ser: 1.38 mg/dL — ABNORMAL HIGH (ref 0.44–1.00)
GFR, Estimated: 39 mL/min — ABNORMAL LOW (ref >60)
Glucose, Bld: 125 mg/dL — ABNORMAL HIGH (ref 70–99)
Potassium: 3.5 mmol/L (ref 3.5–5.1)
Sodium: 138 mmol/L (ref 135–145)
Total Bilirubin: 1.1 mg/dL (ref 0.3–1.2)
Total Protein: 6.6 g/dL (ref 6.5–8.1)

## 2023-06-11 LAB — CBC WITH DIFFERENTIAL/PLATELET
Abs Immature Granulocytes: 0.1 10*3/uL — ABNORMAL HIGH (ref 0.00–0.07)
Basophils Absolute: 0.1 10*3/uL (ref 0.0–0.1)
Basophils Relative: 1 %
Eosinophils Absolute: 0 10*3/uL (ref 0.0–0.5)
Eosinophils Relative: 0 %
HCT: 34.1 % — ABNORMAL LOW (ref 36.0–46.0)
Hemoglobin: 10.7 g/dL — ABNORMAL LOW (ref 12.0–15.0)
Immature Granulocytes: 1 %
Lymphocytes Relative: 12 %
Lymphs Abs: 1.6 10*3/uL (ref 0.7–4.0)
MCH: 24.8 pg — ABNORMAL LOW (ref 26.0–34.0)
MCHC: 31.4 g/dL (ref 30.0–36.0)
MCV: 79.1 fL — ABNORMAL LOW (ref 80.0–100.0)
Monocytes Absolute: 1 10*3/uL (ref 0.1–1.0)
Monocytes Relative: 8 %
Neutro Abs: 10.1 10*3/uL — ABNORMAL HIGH (ref 1.7–7.7)
Neutrophils Relative %: 78 %
Platelets: 262 10*3/uL (ref 150–400)
RBC: 4.31 MIL/uL (ref 3.87–5.11)
RDW: 16.7 % — ABNORMAL HIGH (ref 11.5–15.5)
WBC: 12.9 10*3/uL — ABNORMAL HIGH (ref 4.0–10.5)
nRBC: 0 % (ref 0.0–0.2)

## 2023-06-11 LAB — URINALYSIS, ROUTINE W REFLEX MICROSCOPIC
Bilirubin Urine: NEGATIVE
Glucose, UA: NEGATIVE mg/dL
Ketones, ur: NEGATIVE mg/dL
Nitrite: POSITIVE — AB
Protein, ur: NEGATIVE mg/dL
Specific Gravity, Urine: 1.009 (ref 1.005–1.030)
pH: 6 (ref 5.0–8.0)

## 2023-06-11 LAB — CK: Total CK: 24 U/L — ABNORMAL LOW (ref 38–234)

## 2023-06-11 MED ORDER — SODIUM CHLORIDE 0.9 % IV BOLUS
1000.0000 mL | Freq: Once | INTRAVENOUS | Status: AC
  Administered 2023-06-11: 1000 mL via INTRAVENOUS

## 2023-06-11 MED ORDER — SODIUM CHLORIDE 0.9 % IV SOLN
1.0000 g | Freq: Once | INTRAVENOUS | Status: AC
  Administered 2023-06-12: 1 g via INTRAVENOUS
  Filled 2023-06-11: qty 10

## 2023-06-11 NOTE — H&P (Signed)
History and Physical    Katie Jackson ZOX:[id number] DOB: 06-10-1946 DOA: 06/11/2023  PCP: Georgann Housekeeper, MD  Patient coming from: Home  I have personally briefly reviewed patient's old medical records in [facility]  Chief Complaint: Diminished mentation  HPI: Katie Jackson is a 77 y.o. female with medical history significant for dementia, CKD stage IIIa, HLD who presented to the ED for evaluation of decreased level of mentation.***  ***  ED Course  Labs/Imaging on admission: I have personally reviewed following labs and imaging studies.  Initial vitals showed BP 151/52, pulse 98, RR 20, temp 98.2 F, SpO2 96% on room air.  Labs show WBC 12.9, hemoglobin 10.7, platelets 262,000, sodium 138, potassium 3.5, bicarb 23, BUN 19, creatinine 1.38, serum glucose 125, CK24.  UA shows positive nitrites, moderate leukocytes, 0-5 RBCs, 21-50 WBCs, few bacteria. Urine culture ordered.  CT head without contrast negative for acute endocrine abnormality.  Patient was given 1 L normal saline and IV ceftriaxone.  The hospitalist service was consulted to admit for further evaluation and management.  Review of Systems:  ***All systems reviewed and are negative except as documented in history of present illness above.   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 2   Chronic kidney disease    stage 3   COPD (chronic obstructive pulmonary disease) (HCC)    Daughter denies this dx as of 02/22/23   High cholesterol    Hypertension     Past Surgical History:  Procedure Laterality Date   ABDOMINAL HYSTERECTOMY     BREAST BIOPSY     BUNIONECTOMY Left    cataract Bilateral    MULTIPLE TOOTH EXTRACTIONS     RADIOLOGY WITH ANESTHESIA N/A 04/13/2023   Procedure: MRI WITH ANESTHESIA OF PELVIS WITH AND  WITHOUT CONTRAST;  Surgeon: Radiologist, Medication, MD;  Location: [facility] OR;  Service: Radiology;  Laterality: N/A;    Social History:  reports that she has quit smoking. Her smoking use included cigarettes. She smoked an average of .5 packs per day. She has never used smokeless tobacco. She reports that she does not currently use alcohol. She reports that she does not use drugs.  Allergies  Allergen Reactions   Penicillins Other (See Comments)    From childhood; reaction not recalled.    No family history on file.   Prior to Admission medications   Medication Sig Start Date End Date Taking? Authorizing Provider  Calcium Carb-Cholecalciferol (CALCIUM 600/VITAMIN D3 PO) Take 1 tablet by mouth 2 (two) times daily with a meal.     [provider]  donepezil (ARICEPT) 10 MG tablet TAKE 1 TABLET BY MOUTH AT BEDTIME 05/09/23   Wertman, Sung Amabile, PA-C  lovastatin (MEVACOR) 20 MG tablet Take 20 mg by mouth at bedtime.    [provider]  QUEtiapine (SEROQUEL) 25 MG tablet Take 1 tablet (25 mg total) by mouth at bedtime. 04/27/23 07/26/23  Narda Bonds, MD    Physical Exam: Vitals:   06/11/23 1930 06/11/23 2000 06/11/23 2100 06/11/23 2200  BP: 129/66 132/63 134/65 134/64  Pulse: 95 (!) 102 96 95  Resp: (!) 22 (!) 22 (!) 21 17  Temp:      TempSrc:      SpO2: 96% 99% 98% 96%   ***  Constitutional: NAD, calm, comfortable Eyes: PERRL, lids and conjunctivae normal ENMT: Mucous membranes are moist. Posterior pharynx clear of any exudate or lesions.Normal dentition.  Neck: normal, supple, no masses. Respiratory: clear to auscultation bilaterally, no wheezing, no crackles. Normal respiratory effort. No accessory muscle use.  Cardiovascular: Regular rate and rhythm, no murmurs / rubs / gallops. No extremity edema. 2+ pedal pulses. Abdomen: no tenderness, no masses palpated. No hepatosplenomegaly. Bowel sounds positive.  Musculoskeletal: no clubbing / cyanosis. No joint deformity  upper and lower extremities. Good ROM, no contractures. Normal muscle tone.  Skin: no rashes, lesions, ulcers. No induration Neurologic: CN 2-12 grossly intact. Sensation intact. Strength 5/5 in all 4.  Psychiatric: Normal judgment and insight. Alert and oriented x 3. Normal mood.   EKG: Not performed.  Assessment/Plan Principal Problem:   Acute encephalopathy   *** No notes on file *** Assessment and Plan: Acute encephalopathy on background of dementia: ***  Urinary tract infection: ***  CKD stage IIIa: ***  Chronic microcytic anemia: Hemoglobin stable.   DVT prophylaxis: ***  Code Status: ***  Family Communication: ***  Disposition Plan: ***  Consults called: None Severity of Illness: The appropriate patient status for this patient is OBSERVATION. Observation status is judged to be reasonable and necessary in order to provide the required intensity of service to ensure the patient's safety. The patient's presenting symptoms, physical exam findings, and initial radiographic and laboratory data in the context of their medical condition is felt to place them at decreased risk for further clinical deterioration. Furthermore, it is anticipated that the patient will be medically stable for discharge from the hospital within 2 midnights of admission.   Darreld Mclean MD [facility]  If 7PM-7AM, please contact night-coverage [email or web address]  06/11/2023, 11:42 PM

## 2023-06-11 NOTE — ED Triage Notes (Signed)
EMS reports from home, neighbor called due to Pt being outside for a long time and found unresponsive. Hx of dementia  BP 140/80 HR 90 RR 20 Sp02 94 RA CBG 144  18ga LAC NS enroute

## 2023-06-11 NOTE — ED Provider Notes (Signed)
[place] EMERGENCY DEPARTMENT AT [facility] Provider Note  CSN: [id number] Arrival date & time: 06/11/23 1732  Chief Complaint(s) Loss of Consciousness  HPI Katie Jackson is a 77 y.o. female history of dementia, CKD, COPD, lives with daughter presenting to the emergency department with episode of syncope/unresponsiveness.  Patient was apparently sitting outside in the hot sun for over an hour, found by neighbor with diminished responsiveness.  Daughter reports patient is usually more alert and active.  Daughter also reports patient has had frequent urinary infections.  No reported falls.  Patient was somnolent per EMS.  Daughter does not think patient has had any fevers or other symptoms as far she is aware.  No vomiting or other recent issues.  History limited due to dementia.   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 Authorizing Provider  Calcium Carb-Cholecalciferol (CALCIUM 600/VITAMIN D3 PO) Take 1 tablet by mouth 2 (two) times daily with a meal.     [provider]  donepezil (ARICEPT) 10 MG tablet TAKE 1 TABLET BY MOUTH AT BEDTIME 05/09/23   Wertman, Sung Amabile, PA-C  lovastatin (MEVACOR) 20 MG tablet Take 20 mg by mouth at bedtime.     [provider]  QUEtiapine (SEROQUEL) 25 MG tablet Take 1 tablet (25 mg total) by mouth at bedtime. 04/27/23 07/26/23  Narda Bonds, MD                                                                                                                                    Past Surgical History Past Surgical History:  Procedure Laterality Date   ABDOMINAL HYSTERECTOMY     BREAST BIOPSY     BUNIONECTOMY Left    cataract Bilateral    MULTIPLE TOOTH EXTRACTIONS     RADIOLOGY WITH ANESTHESIA N/A 04/13/2023   Procedure: MRI WITH ANESTHESIA OF PELVIS WITH AND WITHOUT CONTRAST;  Surgeon: Radiologist, Medication, MD;  Location: [facility] OR;  Service: Radiology;  Laterality: N/A;   Family History No family history on file.  Social History Social History   Tobacco Use   Smoking status: Former  Packs/day: .5    Types: Cigarettes   Smokeless tobacco: Never  Vaping Use   Vaping Use: Never used  Substance Use Topics   Alcohol use: Not Currently   Drug use: Never   Allergies Penicillins  Review of Systems Review of Systems  All other systems reviewed and are negative.   Physical Exam Vital Signs  I have reviewed the triage vital signs BP 134/64   Pulse 95   Temp 98.2 F (36.8 C) (Oral)   Resp 17   SpO2 96%  Physical Exam Vitals and nursing note reviewed.  Constitutional:      General: She is not in acute distress.    Appearance: She is well-developed.     Comments: Somnolent but arousable  HENT:     Head: Normocephalic and atraumatic.     Mouth/Throat:     Mouth: Mucous membranes are dry.  Eyes:     Pupils: Pupils are equal, round, and reactive to light.  Cardiovascular:     Rate and Rhythm: Normal rate and regular rhythm.     Heart sounds: No murmur heard. Pulmonary:     Effort: Pulmonary effort is normal. No respiratory distress.     Breath sounds: Normal breath sounds.  Abdominal:     General: Abdomen is flat.     Palpations: Abdomen is soft.     Tenderness:  There is no abdominal tenderness.  Musculoskeletal:        General: No tenderness.     Right lower leg: No edema.     Left lower leg: No edema.  Skin:    General: Skin is warm and dry.  Neurological:     Comments: Patient oriented to self, angry at me when stimulated with sternal rub, moves all 4 extremities.  No obvious cranial nerve deficit.  Psychiatric:        Mood and Affect: Mood normal.        Behavior: Behavior normal.     ED Results and Treatments Labs (all labs ordered are listed, but only abnormal results are displayed) Labs Reviewed  COMPREHENSIVE METABOLIC PANEL - Abnormal; Notable for the following components:      Result Value   Glucose, Bld 125 (*)    Creatinine, Ser 1.38 (*)    Calcium 8.4 (*)    Albumin 3.3 (*)    AST 14 (*)    GFR, Estimated 39 (*)    All other components within normal limits  CBC WITH DIFFERENTIAL/PLATELET - Abnormal; Notable for the following components:   WBC 12.9 (*)    Hemoglobin 10.7 (*)    HCT 34.1 (*)    MCV 79.1 (*)    MCH 24.8 (*)    RDW 16.7 (*)    Neutro Abs 10.1 (*)    Abs Immature Granulocytes 0.10 (*)    All other components within normal limits  CK - Abnormal; Notable for the following components:   Total CK 24 (*)    All other components within normal limits  URINALYSIS, ROUTINE W REFLEX MICROSCOPIC - Abnormal; Notable for the following components:   APPearance HAZY (*)    Hgb urine dipstick SMALL (*)    Nitrite POSITIVE (*)    Leukocytes,Ua MODERATE (*)    Bacteria, UA FEW (*)    All other components within normal limits  URINE CULTURE  Radiology CT Head Wo Contrast  Result Date: 06/11/2023 CLINICAL DATA:  Mental status change, unknown cause Pt being outside for a long time and found unresponsive. Hx of dementia EXAM: CT HEAD WITHOUT CONTRAST TECHNIQUE: Contiguous axial images were obtained  from the base of the skull through the vertex without intravenous contrast. RADIATION DOSE REDUCTION: This exam was performed according to the departmental dose-optimization program which includes automated exposure control, adjustment of the mA and/or kV according to patient size and/or use of iterative reconstruction technique. COMPARISON:  CT head 11/05/2018 FINDINGS: Brain: Cerebral ventricle sizes are concordant with the degree of cerebral volume loss. Patchy and confluent areas of decreased attenuation are noted throughout the deep and periventricular white matter of the cerebral hemispheres bilaterally, compatible with chronic microvascular ischemic disease. No evidence of large-territorial acute infarction. No parenchymal hemorrhage. No mass lesion. No extra-axial collection. No mass effect or midline shift. No hydrocephalus. Basilar cisterns are patent. Vascular: No hyperdense vessel. Atherosclerotic calcifications are present within the cavernous internal carotid arteries. Skull: No acute fracture or focal lesion. Sinuses/Orbits: Paranasal sinuses and mastoid air cells are clear. Bilateral lens replacement. Otherwise the orbits are unremarkable. Other: None. IMPRESSION: No acute intracranial abnormality. Electronically Signed   By: Tish Frederickson M.D.   On: 06/11/2023 19:21    Pertinent labs & imaging results that were available during my care of the patient were reviewed by me and considered in my medical decision making (see MDM for details).  Medications Ordered in ED Medications  cefTRIAXone (ROCEPHIN) 1 g in sodium chloride 0.9 % 100 mL IVPB (has no administration in time range)  sodium chloride 0.9 % bolus 1,000 mL (0 mLs Intravenous Stopped 06/11/23 2001)                                                                                                                                     Procedures Procedures  (including critical care time)  Medical Decision Making / ED  Course   MDM:  77 year old female with history of dementia presenting to the emergency department with episode of unresponsiveness.  Episode apparently occurred after patient had been sitting outside for an hour.  It was very warm today with temperature in the 90s.  Daughter reports patient still not quite back at baseline.  Has had some foul-smelling urine and urinalysis is concerning for urinary infection.  Positive for nitrates, leukocytes, bacteria and WBCs.  She also has a mild AKI likely in the setting of dehydration.  Labs also notable for mild leukocytosis.  CT head without evidence of acute intracranial process.  Labs otherwise without evidence of other electrolyte abnormality.  CK without evidence of rhabdomyolysis.  Will discuss with hospitalist.  Clinical Course as of 06/11/23 2339  Wynelle Link Jun 11, 2023  2338 Discussed with Dr. Allena Katz who will admit patient for further management of UTI, dehydration and weakness. [WS]    Clinical Course User Index [WS]  Lonell Grandchild, MD     Additional history obtained: -Additional history obtained from family and ems -External records from outside source obtained and reviewed including: Chart review including previous notes, labs, imaging, consultation notes including prior admission for UTI   Lab Tests: -I ordered, reviewed, and interpreted labs.   The pertinent results include:   Labs Reviewed  COMPREHENSIVE METABOLIC PANEL - Abnormal; Notable for the following components:      Result Value   Glucose, Bld 125 (*)    Creatinine, Ser 1.38 (*)    Calcium 8.4 (*)    Albumin 3.3 (*)    AST 14 (*)    GFR, Estimated 39 (*)    All other components within normal limits  CBC WITH DIFFERENTIAL/PLATELET - Abnormal; Notable for the following components:   WBC 12.9 (*)    Hemoglobin 10.7 (*)    HCT 34.1 (*)    MCV 79.1 (*)    MCH 24.8 (*)    RDW 16.7 (*)    Neutro Abs 10.1 (*)    Abs Immature Granulocytes 0.10 (*)    All other  components within normal limits  CK - Abnormal; Notable for the following components:   Total CK 24 (*)    All other components within normal limits  URINALYSIS, ROUTINE W REFLEX MICROSCOPIC - Abnormal; Notable for the following components:   APPearance HAZY (*)    Hgb urine dipstick SMALL (*)    Nitrite POSITIVE (*)    Leukocytes,Ua MODERATE (*)    Bacteria, UA FEW (*)    All other components within normal limits  URINE CULTURE    Notable for signs of UTI, leukocytosis, AKI     Imaging Studies ordered: I ordered imaging studies including CT head On my interpretation imaging demonstrates no acute process I independently visualized and interpreted imaging. I agree with the radiologist interpretation   Medicines ordered and prescription drug management: Meds ordered this encounter  Medications   sodium chloride 0.9 % bolus 1,000 mL   cefTRIAXone (ROCEPHIN) 1 g in sodium chloride 0.9 % 100 mL IVPB    Order Specific Question:   Antibiotic Indication:    Answer:   UTI    -I have reviewed the patients home medicines and have made adjustments as needed   Consultations Obtained: I requested consultation with the hospitalist,  and discussed lab and imaging findings as well as pertinent plan - they recommend: admission   Cardiac Monitoring: The patient was maintained on a cardiac monitor.  I personally viewed and interpreted the cardiac monitored which showed an underlying rhythm of: NSR  Social Determinants of Health:  Diagnosis or treatment significantly limited by social determinants of health: former smoker   Reevaluation: After the interventions noted above, I reevaluated the patient and found that their symptoms have improved  Co morbidities that complicate the patient evaluation  Past Medical History:  Diagnosis Date   Age related osteoporosis    Alzheimer disease (HCC) 08/02/2018   late onset - moderate   Cancer (HCC) 08/11/2022   x 2 Squamous cell carcinoma in  situ left long finger and face x 2   Chronic kidney disease    stage 3   COPD (chronic obstructive pulmonary disease) (HCC)    Daughter denies this dx as of 02/22/23   High cholesterol    Hypertension       Dispostion: Disposition decision including need for hospitalization was considered, and patient admitted to the hospital.    Final Clinical  Impression(s) / ED Diagnoses Final diagnoses:  Urinary tract infection without hematuria, site unspecified  AKI (acute kidney injury) (HCC)  Transient alteration of awareness     This chart was dictated using voice recognition software.  Despite best efforts to proofread,  errors can occur which can change the documentation meaning.    Lonell Grandchild, MD 06/11/23 [phone]

## 2023-06-11 NOTE — ED Notes (Signed)
Patient placed on purewick at this time. Daughter at bedside. Denies any needs currently.

## 2023-06-12 ENCOUNTER — Encounter (HOSPITAL_COMMUNITY): Payer: Self-pay | Admitting: Internal Medicine

## 2023-06-12 DIAGNOSIS — G9340 Encephalopathy, unspecified: Secondary | ICD-10-CM | POA: Diagnosis not present

## 2023-06-12 DIAGNOSIS — D509 Iron deficiency anemia, unspecified: Secondary | ICD-10-CM | POA: Diagnosis present

## 2023-06-12 DIAGNOSIS — N390 Urinary tract infection, site not specified: Secondary | ICD-10-CM | POA: Diagnosis present

## 2023-06-12 DIAGNOSIS — Z87440 Personal history of urinary (tract) infections: Secondary | ICD-10-CM | POA: Diagnosis not present

## 2023-06-12 DIAGNOSIS — G309 Alzheimer's disease, unspecified: Secondary | ICD-10-CM | POA: Diagnosis present

## 2023-06-12 DIAGNOSIS — Z87891 Personal history of nicotine dependence: Secondary | ICD-10-CM | POA: Diagnosis not present

## 2023-06-12 DIAGNOSIS — R404 Transient alteration of awareness: Secondary | ICD-10-CM | POA: Diagnosis present

## 2023-06-12 DIAGNOSIS — M810 Age-related osteoporosis without current pathological fracture: Secondary | ICD-10-CM | POA: Diagnosis present

## 2023-06-12 DIAGNOSIS — F02818 Dementia in other diseases classified elsewhere, unspecified severity, with other behavioral disturbance: Secondary | ICD-10-CM | POA: Diagnosis present

## 2023-06-12 DIAGNOSIS — L89151 Pressure ulcer of sacral region, stage 1: Secondary | ICD-10-CM | POA: Diagnosis present

## 2023-06-12 DIAGNOSIS — E876 Hypokalemia: Secondary | ICD-10-CM | POA: Diagnosis present

## 2023-06-12 DIAGNOSIS — J449 Chronic obstructive pulmonary disease, unspecified: Secondary | ICD-10-CM | POA: Diagnosis present

## 2023-06-12 DIAGNOSIS — F05 Delirium due to known physiological condition: Secondary | ICD-10-CM | POA: Diagnosis not present

## 2023-06-12 DIAGNOSIS — X30XXXA Exposure to excessive natural heat, initial encounter: Secondary | ICD-10-CM | POA: Diagnosis not present

## 2023-06-12 DIAGNOSIS — T675XXA Heat exhaustion, unspecified, initial encounter: Secondary | ICD-10-CM | POA: Diagnosis present

## 2023-06-12 DIAGNOSIS — I129 Hypertensive chronic kidney disease with stage 1 through stage 4 chronic kidney disease, or unspecified chronic kidney disease: Secondary | ICD-10-CM | POA: Diagnosis present

## 2023-06-12 DIAGNOSIS — N1831 Chronic kidney disease, stage 3a: Secondary | ICD-10-CM | POA: Diagnosis present

## 2023-06-12 DIAGNOSIS — Z741 Need for assistance with personal care: Secondary | ICD-10-CM | POA: Diagnosis present

## 2023-06-12 DIAGNOSIS — G9341 Metabolic encephalopathy: Secondary | ICD-10-CM | POA: Diagnosis present

## 2023-06-12 DIAGNOSIS — E860 Dehydration: Secondary | ICD-10-CM | POA: Diagnosis present

## 2023-06-12 DIAGNOSIS — E7800 Pure hypercholesterolemia, unspecified: Secondary | ICD-10-CM | POA: Diagnosis present

## 2023-06-12 DIAGNOSIS — Z880 Allergy status to penicillin: Secondary | ICD-10-CM | POA: Diagnosis not present

## 2023-06-12 DIAGNOSIS — N179 Acute kidney failure, unspecified: Secondary | ICD-10-CM | POA: Diagnosis present

## 2023-06-12 DIAGNOSIS — Z86008 Personal history of in-situ neoplasm of other site: Secondary | ICD-10-CM | POA: Diagnosis not present

## 2023-06-12 DIAGNOSIS — Z79899 Other long term (current) drug therapy: Secondary | ICD-10-CM | POA: Diagnosis not present

## 2023-06-12 LAB — BASIC METABOLIC PANEL
Anion gap: 8 (ref 5–15)
BUN: 14 mg/dL (ref 8–23)
CO2: 18 mmol/L — ABNORMAL LOW (ref 22–32)
Calcium: 8.5 mg/dL — ABNORMAL LOW (ref 8.9–10.3)
Chloride: 112 mmol/L — ABNORMAL HIGH (ref 98–111)
Creatinine, Ser: 1.19 mg/dL — ABNORMAL HIGH (ref 0.44–1.00)
GFR, Estimated: 47 mL/min — ABNORMAL LOW (ref >60)
Glucose, Bld: 116 mg/dL — ABNORMAL HIGH (ref 70–99)
Potassium: 3.5 mmol/L (ref 3.5–5.1)
Sodium: 138 mmol/L (ref 135–145)

## 2023-06-12 LAB — CBC
HCT: 34.5 % — ABNORMAL LOW (ref 36.0–46.0)
Hemoglobin: 10.7 g/dL — ABNORMAL LOW (ref 12.0–15.0)
MCH: 24.4 pg — ABNORMAL LOW (ref 26.0–34.0)
MCHC: 31 g/dL (ref 30.0–36.0)
MCV: 78.6 fL — ABNORMAL LOW (ref 80.0–100.0)
Platelets: 261 10*3/uL (ref 150–400)
RBC: 4.39 MIL/uL (ref 3.87–5.11)
RDW: 16.6 % — ABNORMAL HIGH (ref 11.5–15.5)
WBC: 10.6 10*3/uL — ABNORMAL HIGH (ref 4.0–10.5)
nRBC: 0 % (ref 0.0–0.2)

## 2023-06-12 MED ORDER — ONDANSETRON HCL 4 MG PO TABS: 4.0000 mg | ORAL_TABLET | Freq: Four times a day (QID) | ORAL | Status: DC | PRN

## 2023-06-12 MED ORDER — HALOPERIDOL LACTATE 5 MG/ML IJ SOLN
1.0000 mg | Freq: Once | INTRAMUSCULAR | Status: AC | PRN
  Administered 2023-06-12: 1 mg via INTRAVENOUS
  Filled 2023-06-12: qty 1

## 2023-06-12 MED ORDER — ENOXAPARIN SODIUM 30 MG/0.3ML IJ SOSY
30.0000 mg | PREFILLED_SYRINGE | INTRAMUSCULAR | Status: DC
  Administered 2023-06-12 – 2023-06-14 (×3): 30 mg via SUBCUTANEOUS
  Filled 2023-06-12 (×3): qty 0.3

## 2023-06-12 MED ORDER — ONDANSETRON HCL 4 MG/2ML IJ SOLN: 4.0000 mg | Freq: Four times a day (QID) | INTRAMUSCULAR | Status: DC | PRN

## 2023-06-12 MED ORDER — SENNOSIDES-DOCUSATE SODIUM 8.6-50 MG PO TABS: 1.0000 | ORAL_TABLET | Freq: Every evening | ORAL | Status: DC | PRN

## 2023-06-12 MED ORDER — POTASSIUM CHLORIDE IN NACL 20-0.9 MEQ/L-% IV SOLN
INTRAVENOUS | Status: AC
  Filled 2023-06-12 (×2): qty 1000

## 2023-06-12 MED ORDER — QUETIAPINE FUMARATE 25 MG PO TABS
25.0000 mg | ORAL_TABLET | Freq: Every day | ORAL | Status: DC
  Administered 2023-06-12 (×2): 25 mg via ORAL
  Filled 2023-06-12 (×2): qty 1

## 2023-06-12 MED ORDER — ACETAMINOPHEN 650 MG RE SUPP: 650.0000 mg | Freq: Four times a day (QID) | RECTAL | Status: DC | PRN

## 2023-06-12 MED ORDER — ACETAMINOPHEN 325 MG PO TABS: 650.0000 mg | ORAL_TABLET | Freq: Four times a day (QID) | ORAL | Status: DC | PRN

## 2023-06-12 MED ORDER — PRAVASTATIN SODIUM 20 MG PO TABS
20.0000 mg | ORAL_TABLET | Freq: Every day | ORAL | Status: DC
  Administered 2023-06-12 – 2023-06-15 (×4): 20 mg via ORAL
  Filled 2023-06-12 (×4): qty 1

## 2023-06-12 MED ORDER — DONEPEZIL HCL 10 MG PO TABS
10.0000 mg | ORAL_TABLET | Freq: Every day | ORAL | Status: DC
  Administered 2023-06-12 – 2023-06-15 (×5): 10 mg via ORAL
  Filled 2023-06-12 (×6): qty 1

## 2023-06-12 MED ORDER — SODIUM CHLORIDE 0.9 % IV SOLN
1.0000 g | INTRAVENOUS | Status: DC
  Administered 2023-06-13 – 2023-06-14 (×2): 1 g via INTRAVENOUS
  Filled 2023-06-12 (×2): qty 10

## 2023-06-12 NOTE — Progress Notes (Signed)
PROGRESS NOTE  Katie Jackson ZOX:[id number] DOB: [birth year] DOA: 06/11/2023 PCP: Georgann Housekeeper, MD   LOS: 0 days   Brief Narrative / Interim history: 77 year old female with dementia with behavioral disturbances, CKD 3A, hyperlipidemia comes into the hospital due to decreased level of mentation.  Daughter says patient has dementia which is quite advanced, requiring assistance with all her ADLs.  Has significant memory impairment and limited conversation at baseline.  Apparently he was out on the patient to in the heat for about an hour, and was found by a neighbor with diminished responsiveness.  She was brought to the hospital.  She was found to have a UTI and started on antibiotics  Subjective / 24h Interval events: Agitated all night, sleeping this morning.  Daughters at bedside  Assesement and Plan: Principal Problem:   Acute encephalopathy Active Problems:   Dementia with behavioral disturbance (HCC)   Stage 3a chronic kidney disease (CKD) (HCC)   Principal problem UTI -patient still febrile this morning at 100.3, slightly tachycardic overnight.  UA positive for infection.  Urine cultures are pending.  She gets recurrent UTIs  Active problems Acute metabolic encephalopathy with underlying dementia with behavioral disturbances -quite lethargic and weak this morning, sleeping and is hard to wake up.  Continue antibiotics as above.  PT therapy pending. -Ideally she would need placement in a memory care unit, seems to bear significant burden to the family due to her behaviors, staying up all night and sleeping during the day  CKD 3A -creatinine is at baseline  Chronic microcytic anemia-hemoglobin is stable  Scheduled Meds:  donepezil  10 mg Oral QHS   enoxaparin (LOVENOX) injection  30 mg Subcutaneous Q24H   pravastatin  20 mg Oral q1800   QUEtiapine  25 mg Oral QHS   Continuous Infusions:  0.9 % NaCl with KCl 20 mEq / L 100 mL/hr at 06/12/23 0235   cefTRIAXone  (ROCEPHIN)  IV     PRN Meds:.acetaminophen **OR** acetaminophen, ondansetron **OR** ondansetron (ZOFRAN) IV, senna-docusate  Current Outpatient Medications  Medication Instructions   amLODipine (NORVASC) 10 mg, Oral, Daily   Calcium Carb-Cholecalciferol (CALCIUM 600/VITAMIN D3 PO) 1 tablet, Oral, 2 times daily with meals   donepezil (ARICEPT) 10 mg, Oral, Daily at bedtime   lovastatin (MEVACOR) 20 mg, Oral, Daily at bedtime   QUEtiapine (SEROQUEL) 25 mg, Oral, Daily at bedtime    Diet Orders (From admission, onward)     Start     Ordered   06/12/23 0016  Diet regular Room service appropriate? Yes; Fluid consistency: Thin  Diet effective now       Question Answer Comment  Room service appropriate? Yes   Fluid consistency: Thin      06/12/23 0016            DVT prophylaxis: enoxaparin (LOVENOX) injection 30 mg Start: 06/12/23 2200   Lab Results  Component Value Date   PLT 261 06/12/2023      Code Status: Full Code  Family Communication: daughter at bedside   Status is: Observation The patient will require care spanning > 2 midnights and should be moved to inpatient because: Still febrile, IV antibiotics   Level of care: Med-Surg  Consultants:  none  Objective: Vitals:   06/12/23 0100 06/12/23 0159 06/12/23 0635 06/12/23 0700  BP: (!) 160/106 (!) 163/57 (!) 179/87 131/75  Pulse: (!) 102 (!) 101 61   Resp: 16 18 19    Temp:  99.9 F (37.7 C) 100.3 F (37.9 C)  TempSrc:  Oral Axillary   SpO2: 98% 100% (!) 81% 95%    Intake/Output Summary (Last 24 hours) at 06/12/2023 0931 Last data filed at 06/12/2023 0300 Gross per 24 hour  Intake 1140.54 ml  Output --  Net 1140.54 ml   Wt Readings from Last 3 Encounters:  04/22/23 61.2 kg  04/13/23 60.3 kg  04/11/23 60.3 kg    Examination:  Constitutional: sleeping ENMT: Mucous membranes are moist.  Neck: normal, supple Respiratory: clear to auscultation bilaterally, no wheezing, no crackles. Normal  respiratory effort.  Cardiovascular: Regular rate and rhythm, no murmurs / rubs / gallops. No LE edema.  Abdomen: non distended, bowel sounds positive.  Musculoskeletal: no clubbing / cyanosis.   Data Reviewed: I have independently reviewed following labs and imaging studies   CBC Recent Labs  Lab 06/11/23 1901 06/12/23 0441  WBC 12.9* 10.6*  HGB 10.7* 10.7*  HCT 34.1* 34.5*  PLT 262 261  MCV 79.1* 78.6*  MCH 24.8* 24.4*  MCHC 31.4 31.0  RDW 16.7* 16.6*  LYMPHSABS 1.6  --   MONOABS 1.0  --   EOSABS 0.0  --   BASOSABS 0.1  --     Recent Labs  Lab 06/11/23 1901 06/12/23 0701  NA 138 138  K 3.5 3.5  CL 109 112*  CO2 23 18*  GLUCOSE 125* 116*  BUN 19 14  CREATININE 1.38* 1.19*  CALCIUM 8.4* 8.5*  AST 14*  --   ALT 9  --   ALKPHOS 68  --   BILITOT 1.1  --   ALBUMIN 3.3*  --     ------------------------------------------------------------------------------------------------------------------ No results for input(s): "CHOL", "HDL", "LDLCALC", "TRIG", "CHOLHDL", "LDLDIRECT" in the last 72 hours.  No results found for: "HGBA1C" ------------------------------------------------------------------------------------------------------------------ No results for input(s): "TSH", "T4TOTAL", "T3FREE", "THYROIDAB" in the last 72 hours.  Invalid input(s): "FREET3"  Cardiac Enzymes No results for input(s): "CKMB", "TROPONINI", "MYOGLOBIN" in the last 168 hours.  Invalid input(s): "CK" ------------------------------------------------------------------------------------------------------------------ No results found for: "BNP"  CBG: No results for input(s): "GLUCAP" in the last 168 hours.  No results found for this or any previous visit (from the past 240 hour(s)).   Radiology Studies: CT Head Wo Contrast  Result Date: 06/11/2023 CLINICAL DATA:  Mental status change, unknown cause Pt being outside for a long time and found unresponsive. Hx of dementia EXAM: CT HEAD  WITHOUT CONTRAST TECHNIQUE: Contiguous axial images were obtained from the base of the skull through the vertex without intravenous contrast. RADIATION DOSE REDUCTION: This exam was performed according to the departmental dose-optimization program which includes automated exposure control, adjustment of the mA and/or kV according to patient size and/or use of iterative reconstruction technique. COMPARISON:  CT head 11/05/2018 FINDINGS: Brain: Cerebral ventricle sizes are concordant with the degree of cerebral volume loss. Patchy and confluent areas of decreased attenuation are noted throughout the deep and periventricular white matter of the cerebral hemispheres bilaterally, compatible with chronic microvascular ischemic disease. No evidence of large-territorial acute infarction. No parenchymal hemorrhage. No mass lesion. No extra-axial collection. No mass effect or midline shift. No hydrocephalus. Basilar cisterns are patent. Vascular: No hyperdense vessel. Atherosclerotic calcifications are present within the cavernous internal carotid arteries. Skull: No acute fracture or focal lesion. Sinuses/Orbits: Paranasal sinuses and mastoid air cells are clear. Bilateral lens replacement. Otherwise the orbits are unremarkable. Other: None. IMPRESSION: No acute intracranial abnormality. Electronically Signed   By: Tish Frederickson M.D.   On: 06/11/2023 19:21     Pamella Pert, MD, PhD  [facility]  Between 7 am - 7 pm I am available, please contact me via Amion (for emergencies) or Securechat (non urgent messages)  Between 7 pm - 7 am I am not available, please contact night coverage MD/APP via Amion

## 2023-06-12 NOTE — Progress Notes (Signed)
PT Cancellation Note  Patient Details Name: [name] MRN: [id number] DOB: 1946-08-30   Cancelled Treatment:    Reason Eval/Treat Not Completed: Fatigue/lethargy limiting ability to participate (pt sleeping soundly, she did not arouse to verbal/tactile stimulii. Will attempt again later today.)   Tamala Ser PT 06/12/2023  Acute Rehabilitation Services  Office [phone]

## 2023-06-12 NOTE — ED Notes (Signed)
ED TO INPATIENT HANDOFF REPORT  ED Nurse Name and Phone #: Shanda Bumps RN  S Name/Age/Gender Katie Jackson 77 y.o. female Room/Bed: WA21/WA21  Code Status   Code Status: Full Code  Home/SNF/Other Home Patient oriented to: self Is this baseline? Yes   Triage Complete: Triage complete  Chief Complaint Acute encephalopathy [G93.40]  Triage Note EMS reports from home, neighbor called due to Pt being outside for a long time and found unresponsive. Hx of dementia  BP 140/80 HR 90 RR 20 Sp02 94 RA CBG 144  18ga LAC NS enroute     Allergies Allergies  Allergen Reactions   Penicillins Other (See Comments)    From childhood; reaction not recalled.    Level of Care/Admitting Diagnosis ED Disposition     ED Disposition  Admit   Condition  --   Comment  Hospital Area: [facility] [[id number]]  Level of Care: Med-Surg [16]  May place patient in observation at [facility] or [facility] if equivalent level of care is available:: No  Covid Evaluation: Asymptomatic - no recent exposure (last 10 days) testing not required  Diagnosis: Acute encephalopathy [811914]  Admitting Physician: Charlsie Quest [[id number]]  Attending Physician: Charlsie Quest [[id number]]          B Medical/Surgery History 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 2   Chronic kidney disease    stage 3   COPD (chronic obstructive pulmonary disease) (HCC)    Daughter denies this dx as of 02/22/23   High cholesterol    Hypertension    Past Surgical History:  Procedure Laterality Date   ABDOMINAL HYSTERECTOMY     BREAST BIOPSY     BUNIONECTOMY Left    cataract Bilateral    MULTIPLE TOOTH EXTRACTIONS     RADIOLOGY WITH ANESTHESIA N/A 04/13/2023   Procedure: MRI WITH ANESTHESIA OF PELVIS  WITH AND WITHOUT CONTRAST;  Surgeon: Radiologist, Medication, MD;  Location: [facility] OR;  Service: Radiology;  Laterality: N/A;     A IV Location/Drains/Wounds Patient Lines/Drains/Airways Status     Active Line/Drains/Airways     Name Placement date Placement time Site Days   Peripheral IV 06/11/23 18 G Left Antecubital 06/11/23  1739  Antecubital  1   Pressure Injury 02/26/20 Sacrum Mid Stage 1 -  Intact skin with non-blanchable redness of a localized area usually over a bony prominence. 02/26/20  1444  -- 1202            Intake/Output Last 24 hours  Intake/Output Summary (Last 24 hours) at 06/12/2023 0035 Last data filed at 06/11/2023 2001 Gross per 24 hour  Intake 1000 ml  Output --  Net 1000 ml    Labs/Imaging Results for orders placed or performed during the hospital encounter of 06/11/23 (from the past 48 hour(s))  Comprehensive metabolic panel     Status: Abnormal   Collection Time: 06/11/23  7:01 PM  Result Value Ref Range   Sodium 138 135 - 145 mmol/L   Potassium 3.5 3.5 - 5.1 mmol/L   Chloride 109 98 - 111 mmol/L   CO2 23 22 - 32 mmol/L   Glucose, Bld 125 (H) 70 - 99 mg/dL    Comment: Glucose reference range applies only to samples taken after fasting  for at least 8 hours.   BUN 19 8 - 23 mg/dL   Creatinine, Ser 9.56 (H) 0.44 - 1.00 mg/dL   Calcium 8.4 (L) 8.9 - 10.3 mg/dL   Total Protein 6.6 6.5 - 8.1 g/dL   Albumin 3.3 (L) 3.5 - 5.0 g/dL   AST 14 (L) 15 - 41 U/L   ALT 9 0 - 44 U/L   Alkaline Phosphatase 68 38 - 126 U/L   Total Bilirubin 1.1 0.3 - 1.2 mg/dL   GFR, Estimated 39 (L) >60 mL/min    Comment: (NOTE) Calculated using the CKD-EPI Creatinine Equation (2021)    Anion gap 6 5 - 15    Comment: Performed at [facility], [address] [place], [place] [address]  CBC with Differential     Status: Abnormal   Collection Time: 06/11/23  7:01 PM  Result Value Ref Range   WBC 12.9 (H) 4.0 - 10.5 K/uL   RBC 4.31 3.87 - 5.11 MIL/uL    Hemoglobin 10.7 (L) 12.0 - 15.0 g/dL   HCT 65.7 (L) 84.6 - 96.2 %   MCV 79.1 (L) 80.0 - 100.0 fL   MCH 24.8 (L) 26.0 - 34.0 pg   MCHC 31.4 30.0 - 36.0 g/dL   RDW 95.2 (H) 84.1 - 32.4 %   Platelets 262 150 - 400 K/uL   nRBC 0.0 0.0 - 0.2 %   Neutrophils Relative % 78 %   Neutro Abs 10.1 (H) 1.7 - 7.7 K/uL   Lymphocytes Relative 12 %   Lymphs Abs 1.6 0.7 - 4.0 K/uL   Monocytes Relative 8 %   Monocytes Absolute 1.0 0.1 - 1.0 K/uL   Eosinophils Relative 0 %   Eosinophils Absolute 0.0 0.0 - 0.5 K/uL   Basophils Relative 1 %   Basophils Absolute 0.1 0.0 - 0.1 K/uL   Immature Granulocytes 1 %   Abs Immature Granulocytes 0.10 (H) 0.00 - 0.07 K/uL    Comment: Performed at [facility], [address] [place], [place] [address]  CK     Status: Abnormal   Collection Time: 06/11/23  7:01 PM  Result Value Ref Range   Total CK 24 (L) 38 - 234 U/L    Comment: Performed at [facility], [address], [place], [place] [address]  Urinalysis, Routine w reflex microscopic -Urine, Clean Catch     Status: Abnormal   Collection Time: 06/11/23 10:35 PM  Result Value Ref Range   Color, Urine YELLOW YELLOW   APPearance HAZY (A) CLEAR   Specific Gravity, Urine 1.009 1.005 - 1.030   pH 6.0 5.0 - 8.0   Glucose, UA NEGATIVE NEGATIVE mg/dL   Hgb urine dipstick SMALL (A) NEGATIVE   Bilirubin Urine NEGATIVE NEGATIVE   Ketones, ur NEGATIVE NEGATIVE mg/dL   Protein, ur NEGATIVE NEGATIVE mg/dL   Nitrite POSITIVE (A) NEGATIVE   Leukocytes,Ua MODERATE (A) NEGATIVE   RBC / HPF 0-5 0 - 5 RBC/hpf   WBC, UA 21-50 0 - 5 WBC/hpf   Bacteria, UA FEW (A) NONE SEEN   Squamous Epithelial / HPF 0-5 0 - 5 /HPF   Mucus PRESENT     Comment: Performed at [facility], [address], [place], [place] [address]   CT Head Wo Contrast  Result Date: 06/11/2023 CLINICAL DATA:  Mental status change, unknown cause Pt being outside for a long time and found  unresponsive. Hx of dementia EXAM: CT HEAD WITHOUT CONTRAST TECHNIQUE:  Contiguous axial images were obtained from the base of the skull through the vertex without intravenous contrast. RADIATION DOSE REDUCTION: This exam was performed according to the departmental dose-optimization program which includes automated exposure control, adjustment of the mA and/or kV according to patient size and/or use of iterative reconstruction technique. COMPARISON:  CT head 11/05/2018 FINDINGS: Brain: Cerebral ventricle sizes are concordant with the degree of cerebral volume loss. Patchy and confluent areas of decreased attenuation are noted throughout the deep and periventricular white matter of the cerebral hemispheres bilaterally, compatible with chronic microvascular ischemic disease. No evidence of large-territorial acute infarction. No parenchymal hemorrhage. No mass lesion. No extra-axial collection. No mass effect or midline shift. No hydrocephalus. Basilar cisterns are patent. Vascular: No hyperdense vessel. Atherosclerotic calcifications are present within the cavernous internal carotid arteries. Skull: No acute fracture or focal lesion. Sinuses/Orbits: Paranasal sinuses and mastoid air cells are clear. Bilateral lens replacement. Otherwise the orbits are unremarkable. Other: None. IMPRESSION: No acute intracranial abnormality. Electronically Signed   By: Tish Frederickson M.D.   On: 06/11/2023 19:21    Pending Labs Unresulted Labs (From admission, onward)     Start     Ordered   06/12/23 0500  Basic metabolic panel  Tomorrow morning,   R        06/12/23 0016   06/12/23 0500  CBC  Tomorrow morning,   R        06/12/23 0016   06/11/23 2323  Urine Culture (for pregnant, neutropenic or urologic patients or patients with an indwelling urinary catheter)  (Urine Labs)  Add-on,   AD       Question:  Indication  Answer:  Dysuria   06/11/23 2323            Vitals/Pain Today's Vitals   06/11/23 2100 06/11/23 2200  06/11/23 2330 06/12/23 0012  BP: 134/65 134/64  (!) 163/80  Pulse: 96 95 95 100  Resp: (!) 21 17 15 18   Temp:    98.8 F (37.1 C)  TempSrc:    Oral  SpO2: 98% 96% 98% 95%  PainSc:        Isolation Precautions No active isolations  Medications Medications  cefTRIAXone (ROCEPHIN) 1 g in sodium chloride 0.9 % 100 mL IVPB (1 g Intravenous New Bag/Given 06/12/23 0012)  enoxaparin (LOVENOX) injection 30 mg (has no administration in time range)  acetaminophen (TYLENOL) tablet 650 mg (has no administration in time range)    Or  acetaminophen (TYLENOL) suppository 650 mg (has no administration in time range)  0.9 % NaCl with KCl 20 mEq/ L  infusion (has no administration in time range)  ondansetron (ZOFRAN) tablet 4 mg (has no administration in time range)    Or  ondansetron (ZOFRAN) injection 4 mg (has no administration in time range)  senna-docusate (Senokot-S) tablet 1 tablet (has no administration in time range)  cefTRIAXone (ROCEPHIN) 1 g in sodium chloride 0.9 % 100 mL IVPB (has no administration in time range)  sodium chloride 0.9 % bolus 1,000 mL (0 mLs Intravenous Stopped 06/11/23 2001)    Mobility walks with device     Focused Assessments Neuro Assessment Handoff: A&Ox1, dementia baseline          Neuro Assessment:   Neuro Checks:      Has TPA been given? No If patient is a Neuro Trauma and patient is going to OR before floor call report to 4N Charge nurse: [phone] or [phone]   R Recommendations: See Admitting Provider Note  Report given to:   Additional Notes:

## 2023-06-12 NOTE — Progress Notes (Signed)
PT Cancellation Note  Patient Details Name: Katie Jackson MRN: [id number] DOB: 07-09-1946   Cancelled Treatment:    Reason Eval/Treat Not Completed: Fatigue/lethargy limiting ability to participate (pt remains soundly asleep, unable to arouse her. Per RN, family stated pt sleeps during the day and is awake at night at baseline. Will attempt again tomorrow.)   Tamala Ser PT 06/12/2023  Acute Rehabilitation Services  Office [phone]

## 2023-06-12 NOTE — Hospital Course (Signed)
Katie Jackson is a 77 y.o. female with medical history significant for dementia with behavioral disturbance, CKD stage IIIa, HLD who is admitted with acute encephalopathy in setting of heat exhaustion and possible UTI.

## 2023-06-13 DIAGNOSIS — G9340 Encephalopathy, unspecified: Secondary | ICD-10-CM | POA: Diagnosis not present

## 2023-06-13 LAB — URINE CULTURE

## 2023-06-13 LAB — COMPREHENSIVE METABOLIC PANEL
ALT: 10 U/L (ref 0–44)
AST: 13 U/L — ABNORMAL LOW (ref 15–41)
Albumin: 2.8 g/dL — ABNORMAL LOW (ref 3.5–5.0)
Alkaline Phosphatase: 58 U/L (ref 38–126)
Anion gap: 9 (ref 5–15)
BUN: 17 mg/dL (ref 8–23)
CO2: 21 mmol/L — ABNORMAL LOW (ref 22–32)
Calcium: 8.4 mg/dL — ABNORMAL LOW (ref 8.9–10.3)
Chloride: 110 mmol/L (ref 98–111)
Creatinine, Ser: 1.24 mg/dL — ABNORMAL HIGH (ref 0.44–1.00)
GFR, Estimated: 45 mL/min — ABNORMAL LOW (ref >60)
Glucose, Bld: 101 mg/dL — ABNORMAL HIGH (ref 70–99)
Potassium: 3.3 mmol/L — ABNORMAL LOW (ref 3.5–5.1)
Sodium: 140 mmol/L (ref 135–145)
Total Bilirubin: 0.6 mg/dL (ref 0.3–1.2)
Total Protein: 6.1 g/dL — ABNORMAL LOW (ref 6.5–8.1)

## 2023-06-13 LAB — MAGNESIUM: Magnesium: 1.9 mg/dL (ref 1.7–2.4)

## 2023-06-13 LAB — CBC
HCT: 32.5 % — ABNORMAL LOW (ref 36.0–46.0)
Hemoglobin: 9.9 g/dL — ABNORMAL LOW (ref 12.0–15.0)
MCH: 24.6 pg — ABNORMAL LOW (ref 26.0–34.0)
MCHC: 30.5 g/dL (ref 30.0–36.0)
MCV: 80.8 fL (ref 80.0–100.0)
Platelets: 221 10*3/uL (ref 150–400)
RBC: 4.02 MIL/uL (ref 3.87–5.11)
RDW: 17 % — ABNORMAL HIGH (ref 11.5–15.5)
WBC: 11.5 10*3/uL — ABNORMAL HIGH (ref 4.0–10.5)
nRBC: 0 % (ref 0.0–0.2)

## 2023-06-13 LAB — PHOSPHORUS: Phosphorus: 3.1 mg/dL (ref 2.5–4.6)

## 2023-06-13 MED ORDER — POTASSIUM CHLORIDE 20 MEQ PO PACK
40.0000 meq | PACK | Freq: Once | ORAL | Status: AC
  Administered 2023-06-13: 40 meq via ORAL
  Filled 2023-06-13: qty 2

## 2023-06-13 MED ORDER — QUETIAPINE FUMARATE 25 MG PO TABS
25.0000 mg | ORAL_TABLET | Freq: Every day | ORAL | Status: DC
  Administered 2023-06-13: 25 mg via ORAL
  Filled 2023-06-13: qty 1

## 2023-06-13 MED ORDER — QUETIAPINE FUMARATE 25 MG PO TABS: 50.0000 mg | ORAL_TABLET | Freq: Every day | ORAL | Status: DC

## 2023-06-13 NOTE — TOC CM/SW Note (Addendum)
Transition of Care [facility]) - Inpatient Brief Assessment   Patient Details  Name: Katie Jackson MRN: [id number] Date of Birth: 1946/08/21  [facility] CM/SW Contact:    Durenda Guthrie, RN Phone Number: 06/13/2023, 11:54 AM   Clinical Narrative: Case Manager spoke patient's daughter, Vassie Moselle, [phone], discussed recommendation for Home Health PT, She states she prefers her mom to go to SNF or Memory Care, she understands that we can't get her into Memory Care from hospital. CM explained that we will have Social Worker come out as well and they can work on that when patient gets home. Bjorn Loser is emotional this morning, she states her youngest sister, 18yrs old, passed on this morning and she is trying to handle that as well. She has not discussed this with her mom yet. Referral has been called to Tresa Endo, Liaison with [facility], waiting for confirmation.  12:10: [facility] can not accept patient. CM contacted Lorenza Chick with [facility], may be a few days delay but they will accept patient.   Transition of Care Asessment: Insurance and Status: Insurance coverage has been reviewed Patient has primary care physician: Yes Home environment has been reviewed: lives in an apartment, granddaughter stays with her Prior level of function:: has dementia, needs some assistance Prior/Current Home Services: No current home services Social Determinants of Health Reivew: SDOH reviewed no interventions necessary Readmission risk has been reviewed: Yes Transition of care needs: transition of care needs identified, TOC will continue to follow

## 2023-06-13 NOTE — Progress Notes (Addendum)
PROGRESS NOTE  Katie Jackson ZOX:[id number] DOB: [birth year] DOA: 06/11/2023 PCP: Georgann Housekeeper, MD   LOS: 1 day   Brief Narrative / Interim history: 77 year old female with dementia with behavioral disturbances, CKD 3A, hyperlipidemia comes into the hospital due to decreased level of mentation.  Daughter says patient has dementia which is quite advanced, requiring assistance with all her ADLs.  Has significant memory impairment and limited conversation at baseline.  Apparently he was out on the patient to in the heat for about an hour, and was found by a neighbor with diminished responsiveness.  She was brought to the hospital.  She was found to have a UTI and started on antibiotics  Subjective / 24h Interval events: Appears more alert today.  No family at bedside.  Pleasantly demented, no complaints  Assesement and Plan: Principal Problem:   Acute encephalopathy Active Problems:   Dementia with behavioral disturbance (HCC)   Stage 3a chronic kidney disease (CKD) (HCC)   UTI (urinary tract infection)   Principal problem UTI -last fever yesterday, fever curve improving.  Continue ceftriaxone and continue to monitor urine cultures.  Active problems Acute metabolic encephalopathy with underlying dementia with behavioral disturbances -very lethargic and weak yesterday, but much better today, alert, with underlying dementia but pleasant and without complaints.  Could not work with PT yesterday due to lethargy, awaiting reevaluation today. -Ideally she would need placement in a memory care unit, seems to bear significant burden to the family due to her behaviors, staying up all night and sleeping during the day  CKD 3A -creatinine is at baseline  Hypokalemia-replace potassium and continue to monitor.  Magnesium is normal at 1.9  Chronic microcytic anemia-hemoglobin is stable  Scheduled Meds:  donepezil  10 mg Oral QHS   enoxaparin (LOVENOX) injection  30 mg Subcutaneous Q24H    pravastatin  20 mg Oral q1800   QUEtiapine  25 mg Oral QHS   Continuous Infusions:  cefTRIAXone (ROCEPHIN)  IV 1 g (06/13/23 0018)   PRN Meds:.acetaminophen **OR** acetaminophen, ondansetron **OR** ondansetron (ZOFRAN) IV, senna-docusate  Current Outpatient Medications  Medication Instructions   amLODipine (NORVASC) 10 mg, Oral, Daily   Calcium Carb-Cholecalciferol (CALCIUM 600/VITAMIN D3 PO) 1 tablet, Oral, 2 times daily with meals   donepezil (ARICEPT) 10 mg, Oral, Daily at bedtime   lovastatin (MEVACOR) 20 mg, Oral, Daily at bedtime   QUEtiapine (SEROQUEL) 25 mg, Oral, Daily at bedtime    Diet Orders (From admission, onward)     Start     Ordered   06/12/23 0016  Diet regular Room service appropriate? Yes; Fluid consistency: Thin  Diet effective now       Question Answer Comment  Room service appropriate? Yes   Fluid consistency: Thin      06/12/23 0016            DVT prophylaxis: enoxaparin (LOVENOX) injection 30 mg Start: 06/12/23 2200   Lab Results  Component Value Date   PLT 221 06/13/2023      Code Status: Full Code  Family Communication: daughter at bedside   Status is: Inpatient   Level of care: Med-Surg  Consultants:  none  Objective: Vitals:   06/12/23 1343 06/12/23 1540 06/12/23 2036 06/13/23 0439  BP: 136/64  139/65 (!) 123/54  Pulse: 93  97 89  Resp: 19  16 18   Temp: 98.6 F (37 C)  98.4 F (36.9 C) 97.7 F (36.5 C)  TempSrc: Oral  Oral Oral  SpO2: 95%  96% 97%  Weight:  58.4 kg    Height:  5\' 1"  (1.549 m)      Intake/Output Summary (Last 24 hours) at 06/13/2023 1107 Last data filed at 06/13/2023 0600 Gross per 24 hour  Intake 720 ml  Output --  Net 720 ml    Wt Readings from Last 3 Encounters:  06/12/23 58.4 kg  04/22/23 61.2 kg  04/13/23 60.3 kg    Examination:  Constitutional: NAD Eyes: lids and conjunctivae normal, no scleral icterus ENMT: mmm Neck: normal, supple Respiratory: clear to auscultation bilaterally,  no wheezing, no crackles. Normal respiratory effort.  Cardiovascular: Regular rate and rhythm, no murmurs / rubs / gallops. No LE edema. Abdomen: soft, no distention, no tenderness. Bowel sounds positive.   Data Reviewed: I have independently reviewed following labs and imaging studies   CBC Recent Labs  Lab 06/11/23 1901 06/12/23 0441 06/13/23 0459  WBC 12.9* 10.6* 11.5*  HGB 10.7* 10.7* 9.9*  HCT 34.1* 34.5* 32.5*  PLT 262 261 221  MCV 79.1* 78.6* 80.8  MCH 24.8* 24.4* 24.6*  MCHC 31.4 31.0 30.5  RDW 16.7* 16.6* 17.0*  LYMPHSABS 1.6  --   --   MONOABS 1.0  --   --   EOSABS 0.0  --   --   BASOSABS 0.1  --   --      Recent Labs  Lab 06/11/23 1901 06/12/23 0701 06/13/23 0459  NA 138 138 140  K 3.5 3.5 3.3*  CL 109 112* 110  CO2 23 18* 21*  GLUCOSE 125* 116* 101*  BUN 19 14 17   CREATININE 1.38* 1.19* 1.24*  CALCIUM 8.4* 8.5* 8.4*  AST 14*  --  13*  ALT 9  --  10  ALKPHOS 68  --  58  BILITOT 1.1  --  0.6  ALBUMIN 3.3*  --  2.8*  MG  --   --  1.9     ------------------------------------------------------------------------------------------------------------------ No results for input(s): "CHOL", "HDL", "LDLCALC", "TRIG", "CHOLHDL", "LDLDIRECT" in the last 72 hours.  No results found for: "HGBA1C" ------------------------------------------------------------------------------------------------------------------ No results for input(s): "TSH", "T4TOTAL", "T3FREE", "THYROIDAB" in the last 72 hours.  Invalid input(s): "FREET3"  Cardiac Enzymes No results for input(s): "CKMB", "TROPONINI", "MYOGLOBIN" in the last 168 hours.  Invalid input(s): "CK" ------------------------------------------------------------------------------------------------------------------ No results found for: "BNP"  CBG: No results for input(s): "GLUCAP" in the last 168 hours.  Recent Results (from the past 240 hour(s))  Urine Culture (for pregnant, neutropenic or urologic patients or  patients with an indwelling urinary catheter)     Status: Abnormal (Preliminary result)   Collection Time: 06/11/23 10:35 PM   Specimen: Urine, Clean Catch  Result Value Ref Range Status   Specimen Description   Final    URINE, CLEAN CATCH Performed at [facility], [address] [place], [place] [address]    Special Requests   Final    NONE Performed at [facility], [address] [place], [place] [address]    Culture (A)  Final    >=100,000 COLONIES/mL ESCHERICHIA COLI SUSCEPTIBILITIES TO FOLLOW Performed at [facility], [address] [place], [place] [address]    Report Status PENDING  Incomplete     Radiology Studies: No results found.   Pamella Pert, MD, PhD [facility]  Between 7 am - 7 pm I am available, please contact me via Amion (for emergencies) or Securechat (non urgent messages)  Between 7 pm - 7 am I am not available, please contact night  coverage MD/APP via Amion

## 2023-06-13 NOTE — Evaluation (Addendum)
Physical Therapy Evaluation Patient Details Name: Katie Jackson MRN: [id number] DOB: 10-26-1946 Today's Date: 06/13/2023  History of Present Illness  77 y.o. female with medical history significant for dementia with behavioral disturbance, CKD stage IIIa, HLD who presented to the ED for evaluation of decreased level of mentation. Dx of acute encephalopathy, UTI.  Clinical Impression  On eval, pt required Min A for mobility. She walked ~65 feet with a RW. Pt followed 1 step commands well with time. She was fairly easily awakened and participated well. Prior to session, observed pt feeding herself breakfast. No family present during session. Pt is likely near her baseline with regards to mobility. Recommend continued therapy after this hospital stay. Will continue to follow and progress activity as tolerated.        Recommendations for follow up therapy are one component of a multi-disciplinary discharge planning process, led by the attending physician.  Recommendations may be updated based on patient status, additional functional criteria and insurance authorization.  Follow Up Recommendations       Assistance Recommended at Discharge Frequent or constant Supervision/Assistance  Patient can return home with the following  A little help with walking and/or transfers;A little help with bathing/dressing/bathroom;Assistance with cooking/housework;Assist for transportation;Help with stairs or ramp for entrance    Equipment Recommendations None recommended by PT  Recommendations for Other Services       Functional Status Assessment Patient has had a recent decline in their functional status and demonstrates the ability to make significant improvements in function in a reasonable and predictable amount of time.     Precautions / Restrictions Precautions Precautions: Fall Restrictions Weight Bearing Restrictions: No      Mobility  Bed Mobility Overal bed mobility: Needs  Assistance Bed Mobility: Supine to Sit     Supine to sit: Min assist, HOB elevated     General bed mobility comments: Fairly easily awakened with verbal + tactile stimuli. Assist for trunk to upright. Pt able to scoot towards EOB with time.    Transfers Overall transfer level: Needs assistance Equipment used: Rolling walker (2 wheels) Transfers: Sit to/from Stand Sit to Stand: Min assist           General transfer comment: Assist to power up, steady.    Ambulation/Gait Ambulation/Gait assistance: Min guard Gait Distance (Feet): 60 Feet Assistive device: Rolling walker (2 wheels) Gait Pattern/deviations: Step-through pattern, Decreased stride length       General Gait Details: Close Min guard A. No LOB with RW use. Tolerated distance well. Followed directional cues well.  Stairs            Wheelchair Mobility    Modified Rankin (Stroke Patients Only)       Balance Overall balance assessment: Needs assistance         Standing balance support: Bilateral upper extremity supported, Reliant on assistive device for balance, During functional activity Standing balance-Leahy Scale: Poor                               Pertinent Vitals/Pain Pain Assessment Pain Assessment: Faces Faces Pain Scale: No hurt    Home Living Family/patient expects to be discharged to:: Private residence Living Arrangements: Children;Other relatives Available Help at Discharge: Family;Available 24 hours/day Type of Home: Apartment Home Access: Stairs to enter   Entrance Stairs-Number of Steps: 1   Home Layout: One level Home Equipment: BSC/3in1;Cane - single point;Wheelchair - Forensic psychologist (2 wheels);Rollator (4  wheels);Grab bars - tub/shower;Shower seat;Hand held shower head      Prior Function Prior Level of Function : Needs assist             Mobility Comments: walked with RW - always with min assist by family ADLs Comments: mod assist for ADLs  by family (info taken from previous admission)     Hand Dominance        Extremity/Trunk Assessment   Upper Extremity Assessment Upper Extremity Assessment: Generalized weakness    Lower Extremity Assessment Lower Extremity Assessment: Generalized weakness       Communication   Communication: No difficulties  Cognition Arousal/Alertness: Awake/alert Behavior During Therapy: WFL for tasks assessed/performed Overall Cognitive Status: History of cognitive impairments - at baseline                                 General Comments: followed 1 step commands well with time        General Comments      Exercises     Assessment/Plan    PT Assessment Patient needs continued PT services  PT Problem List Decreased strength;Decreased activity tolerance;Decreased balance;Decreased mobility;Decreased cognition;Decreased safety awareness       PT Treatment Interventions DME instruction;Gait training;Therapeutic exercise;Balance training;Functional mobility training;Therapeutic activities;Patient/family education    PT Goals (Current goals can be found in the Care Plan section)  Acute Rehab PT Goals Patient Stated Goal: pt unable to state PT Goal Formulation: Patient unable to participate in goal setting Time For Goal Achievement: 06/27/23 Potential to Achieve Goals: Fair    Frequency Min 1X/week     Co-evaluation               AM-PAC PT "6 Clicks" Mobility  Outcome Measure Help needed turning from your back to your side while in a flat bed without using bedrails?: A Little Help needed moving from lying on your back to sitting on the side of a flat bed without using bedrails?: A Little Help needed moving to and from a bed to a chair (including a wheelchair)?: A Little Help needed standing up from a chair using your arms (e.g., wheelchair or bedside chair)?: A Little Help needed to walk in hospital room?: A Little Help needed climbing 3-5 steps with a  railing? : A Lot 6 Click Score: 17    End of Session Equipment Utilized During Treatment: Gait belt Activity Tolerance: Patient tolerated treatment well Patient left: in chair;with call bell/phone within reach;with chair alarm set Nurse Communication: Mobility status PT Visit Diagnosis: Muscle weakness (generalized) (M62.81);Difficulty in walking, not elsewhere classified (R26.2)    Time: 1610-9604 PT Time Calculation (min) (ACUTE ONLY): 14 min   Charges:   PT Evaluation $PT Eval Low Complexity: 1 Low            Faye Ramsay, PT Acute Rehabilitation  Office: [phone]

## 2023-06-14 DIAGNOSIS — G9340 Encephalopathy, unspecified: Secondary | ICD-10-CM | POA: Diagnosis not present

## 2023-06-14 LAB — URINE CULTURE: Culture: >=100000 — AB

## 2023-06-14 MED ORDER — QUETIAPINE FUMARATE 25 MG PO TABS
25.0000 mg | ORAL_TABLET | Freq: Every day | ORAL | Status: DC
  Administered 2023-06-14: 25 mg via ORAL
  Filled 2023-06-14: qty 1

## 2023-06-14 MED ORDER — CEPHALEXIN 500 MG PO CAPS
500.0000 mg | ORAL_CAPSULE | Freq: Two times a day (BID) | ORAL | Status: DC
  Administered 2023-06-14 – 2023-06-16 (×4): 500 mg via ORAL
  Filled 2023-06-14 (×4): qty 1

## 2023-06-14 NOTE — Progress Notes (Signed)
    Patient Name: Katie Jackson           DOB: 1946/05/11  MRN: [id number]      Admission Date: 06/11/2023  Attending Provider: Alba Cory, MD  Primary Diagnosis: Acute encephalopathy   Level of care: Med-Surg    CROSS COVER NOTE   Date of Service   06/14/2023   Katie Jackson, 77 y.o. female, was admitted on 06/11/2023 for Acute encephalopathy.    HPI/Events of Note   Patein has hx of dementia with behavioral disturbance and is currently not responding to redirection.  High fall risk, request was made for safety sitter.     Interventions/ Plan   Safety sitter 1:1        Anthoney Harada, DNP, [facility]- [facility] [place]

## 2023-06-14 NOTE — Progress Notes (Signed)
PROGRESS NOTE    Katie Jackson  WGN:[id number] DOB: 07-16-1946 DOA: 06/11/2023 PCP: Georgann Housekeeper, MD   Brief Narrative: 77 year old with past medical history significant for dementia with behavioral disturbance, CKD 3A, hyperlipidemia presents to the hospital due to decreased level of mentation.  Per daughter patient has dementia which is quite advanced, requiring assistance with all her ADLs.  Has significant memory impairment and limited conversation at baseline.  The day of admission patient sat outside under the covers past year in the heat for about 1 hour.  Patient was find  my neighbor with diminished responsiveness and difficult to arouse.   Assessment & Plan:   Principal Problem:   Acute encephalopathy Active Problems:   Dementia with behavioral disturbance (HCC)   Stage 3a chronic kidney disease (CKD) (HCC)   UTI (urinary tract infection)  1-UTI: Patient presented with confusion, UA with positive nitrates, 1-50 white blood cell. Urine culture; more than 100,000 colony E. Coli Treated with IV ceftriaxone for 2 days, she will be transition to oral keflex.   2-Acute metabolic encephalopathy with underlying dementia with behavioral disturbance: -In the setting of UTI and underlying dementia -Will add scheduled Seroquel earlier to help with sundowning  3-CKD 3A: Renal function stable monitor  4-Hypokalemia: Replace orally  5-Chronic microcytic anemia: Monitor hemoglobin, stable    See wound care documentation below  Pressure Injury 02/26/20 Sacrum Mid Stage 1 -  Intact skin with non-blanchable redness of a localized area usually over a bony prominence. (Active)  02/26/20 1444  Location: Sacrum  Location Orientation: Mid  Staging: Stage 1 -  Intact skin with non-blanchable redness of a localized area usually over a bony prominence.  Wound Description (Comments):   Present on Admission: Yes     Estimated body mass index is 24.33 kg/m as calculated from  the following:   Height as of this encounter: 5\' 1"  (1.549 m).   Weight as of this encounter: 58.4 kg.   DVT prophylaxis: Lovenox Code Status: Full code Family Communication: Care discussed with patient Disposition Plan:  Status is: Inpatient Remains inpatient appropriate because: Management of UTI, encephalopathy     Consultants:  None  Procedures:  none  Antimicrobials:    Subjective: She is alert, confuse. Per nurse patient was awake up until midnight.    Objective: Vitals:   06/13/23 1400 06/13/23 1930 06/14/23 0645 06/14/23 0809  BP: (!) 145/102 124/63 (!) 154/72 137/74  Pulse: (!) 101 (!) 105 91 92  Resp:  16 16 17   Temp: 97.7 F (36.5 C) 98.8 F (37.1 C) 98.1 F (36.7 C) (!) 97.5 F (36.4 C)  TempSrc: Oral Oral Oral Oral  SpO2: 99% 97% 96% 95%  Weight:      Height:        Intake/Output Summary (Last 24 hours) at 06/14/2023 0838 Last data filed at 06/13/2023 1727 Gross per 24 hour  Intake 816.87 ml  Output --  Net 816.87 ml   Filed Weights   06/12/23 1540  Weight: 58.4 kg    Examination:  General exam: Appears calm and comfortable  Respiratory system: Clear to auscultation. Respiratory effort normal. Cardiovascular system: S1 & S2 heard, RRR. No JVD, murmurs, rubs, gallops or clicks. No pedal edema. Gastrointestinal system: Abdomen is nondistended, soft and nontender. No organomegaly or masses felt. Normal bowel sounds heard. Central nervous system: Alert not oriented. confuse Extremities: Symmetric 5 x 5 power.    Data Reviewed: I have personally reviewed following labs and imaging studies  CBC:  Recent Labs  Lab 06/11/23 1901 06/12/23 0441 06/13/23 0459  WBC 12.9* 10.6* 11.5*  NEUTROABS 10.1*  --   --   HGB 10.7* 10.7* 9.9*  HCT 34.1* 34.5* 32.5*  MCV 79.1* 78.6* 80.8  PLT 262 261 221   Basic Metabolic Panel: Recent Labs  Lab 06/11/23 1901 06/12/23 0701 06/13/23 0459  NA 138 138 140  K 3.5 3.5 3.3*  CL 109 112* 110  CO2  23 18* 21*  GLUCOSE 125* 116* 101*  BUN 19 14 17   CREATININE 1.38* 1.19* 1.24*  CALCIUM 8.4* 8.5* 8.4*  MG  --   --  1.9  PHOS  --   --  3.1   GFR: Estimated Creatinine Clearance: 30.7 mL/min (A) (by C-G formula based on SCr of 1.24 mg/dL (H)). Liver Function Tests: Recent Labs  Lab 06/11/23 1901 06/13/23 0459  AST 14* 13*  ALT 9 10  ALKPHOS 68 58  BILITOT 1.1 0.6  PROT 6.6 6.1*  ALBUMIN 3.3* 2.8*   No results for input(s): "LIPASE", "AMYLASE" in the last 168 hours. No results for input(s): "AMMONIA" in the last 168 hours. Coagulation Profile: No results for input(s): "INR", "PROTIME" in the last 168 hours. Cardiac Enzymes: Recent Labs  Lab 06/11/23 1901  CKTOTAL 24*   BNP (last 3 results) No results for input(s): "PROBNP" in the last 8760 hours. HbA1C: No results for input(s): "HGBA1C" in the last 72 hours. CBG: No results for input(s): "GLUCAP" in the last 168 hours. Lipid Profile: No results for input(s): "CHOL", "HDL", "LDLCALC", "TRIG", "CHOLHDL", "LDLDIRECT" in the last 72 hours. Thyroid Function Tests: No results for input(s): "TSH", "T4TOTAL", "FREET4", "T3FREE", "THYROIDAB" in the last 72 hours. Anemia Panel: No results for input(s): "VITAMINB12", "FOLATE", "FERRITIN", "TIBC", "IRON", "RETICCTPCT" in the last 72 hours. Sepsis Labs: No results for input(s): "PROCALCITON", "LATICACIDVEN" in the last 168 hours.  Recent Results (from the past 240 hour(s))  Urine Culture (for pregnant, neutropenic or urologic patients or patients with an indwelling urinary catheter)     Status: Abnormal   Collection Time: 06/11/23 10:35 PM   Specimen: Urine, Clean Catch  Result Value Ref Range Status   Specimen Description   Final    URINE, CLEAN CATCH Performed at [facility], [address] [place], [place] [address]    Special Requests   Final    NONE Performed at [facility], [address], [place], [place] [address]     Culture >=100,000 COLONIES/mL ESCHERICHIA COLI (A)  Final   Report Status 06/14/2023 FINAL  Final   Organism ID, Bacteria ESCHERICHIA COLI (A)  Final      Susceptibility   Escherichia coli - MIC*    AMPICILLIN >=32 RESISTANT Resistant     CEFAZOLIN <=4 SENSITIVE Sensitive     CEFEPIME <=0.12 SENSITIVE Sensitive     CEFTRIAXONE <=0.25 SENSITIVE Sensitive     CIPROFLOXACIN <=0.25 SENSITIVE Sensitive     GENTAMICIN <=1 SENSITIVE Sensitive     IMIPENEM <=0.25 SENSITIVE Sensitive     NITROFURANTOIN <=16 SENSITIVE Sensitive     TRIMETH/SULFA <=20 SENSITIVE Sensitive     AMPICILLIN/SULBACTAM >=32 RESISTANT Resistant     PIP/TAZO <=4 SENSITIVE Sensitive     * >=100,000 COLONIES/mL ESCHERICHIA COLI         Radiology Studies: No results found.      Scheduled Meds:  donepezil  10 mg Oral QHS   enoxaparin (LOVENOX) injection  30 mg Subcutaneous Q24H   pravastatin  20 mg Oral q1800   QUEtiapine  25 mg Oral QHS   Continuous Infusions:  cefTRIAXone (ROCEPHIN)  IV 1 g (06/14/23 0024)     LOS: 2 days    Time spent: 35 minutes    Cheryel Kyte A Courtnee Myer, MD [facility]   If 7PM-7AM, please contact night-coverage [email or web address]  06/14/2023, 8:38 AM

## 2023-06-14 NOTE — Progress Notes (Signed)
Mobility Specialist - Progress Note   06/14/23 1235  Mobility  Activity Transferred from chair to bed  Level of Assistance Standby assist, set-up cues, supervision of patient - no hands on  Assistive Device None  Distance Ambulated (ft) 5 ft  Range of Motion/Exercises Active  Activity Response Tolerated well  Mobility Referral Yes  $Mobility charge 1 Mobility  Mobility Specialist Start Time (ACUTE ONLY) 1230  Mobility Specialist Stop Time (ACUTE ONLY) 1236  Mobility Specialist Time Calculation (min) (ACUTE ONLY) 6 min   Pt received in chair and agreed to mobility. Had no issues throughout session, returned to bed with all needs met.  Marilynne Halsted Mobility Specialist

## 2023-06-14 NOTE — Progress Notes (Signed)
Physical Therapy Treatment Patient Details Name: [place] MRN: [id number] DOB: 05/26/1946 Today's Date: 06/14/2023   History of Present Illness 77 y.o. female with medical history significant for dementia with behavioral disturbance, CKD stage IIIa, HLD who presented to the ED for evaluation of decreased level of mentation. Dx of acute encephalopathy, UTI.    PT Comments    Family present during session. Pt required min encouragement for participation. She tolerated increased ambulation distance well. May benefit from continued therapy after this hospital stay.    Recommendations for follow up therapy are one component of a multi-disciplinary discharge planning process, led by the attending physician.  Recommendations may be updated based on patient status, additional functional criteria and insurance authorization.  Follow Up Recommendations       Assistance Recommended at Discharge Frequent or constant Supervision/Assistance  Patient can return home with the following A little help with walking and/or transfers;A little help with bathing/dressing/bathroom;Assistance with cooking/housework;Assist for transportation;Help with stairs or ramp for entrance   Equipment Recommendations  None recommended by PT    Recommendations for Other Services       Precautions / Restrictions Precautions Precautions: Fall Restrictions Weight Bearing Restrictions: No     Mobility  Bed Mobility               General bed mobility comments: oob in recliner    Transfers Overall transfer level: Needs assistance Equipment used: Rolling walker (2 wheels) Transfers: Sit to/from Stand Sit to Stand: Min guard           General transfer comment: MIn guard for safety.    Ambulation/Gait Ambulation/Gait assistance: Min guard Gait Distance (Feet): 120 Feet Assistive device: Rolling walker (2 wheels) Gait Pattern/deviations: Step-through pattern, Decreased stride length        General Gait Details: MIn guard A for safety. No LOB with RW use. Tolerated distance well. Followed directional cues well.   Stairs             Wheelchair Mobility    Modified Rankin (Stroke Patients Only)       Balance Overall balance assessment: Needs assistance         Standing balance support: Bilateral upper extremity supported, Reliant on assistive device for balance, During functional activity Standing balance-Leahy Scale: Poor                              Cognition Arousal/Alertness: Awake/alert Behavior During Therapy: WFL for tasks assessed/performed Overall Cognitive Status: History of cognitive impairments - at baseline                                 General Comments: followed 1 step commands well with time and encouragement        Exercises      General Comments        Pertinent Vitals/Pain Pain Assessment Pain Assessment: Faces Faces Pain Scale: No hurt    Home Living                          Prior Function            PT Goals (current goals can now be found in the care plan section) Progress towards PT goals: Progressing toward goals    Frequency    Min 1X/week      PT Plan Current plan  remains appropriate    Co-evaluation              AM-PAC PT "6 Clicks" Mobility   Outcome Measure  Help needed turning from your back to your side while in a flat bed without using bedrails?: A Little Help needed moving from lying on your back to sitting on the side of a flat bed without using bedrails?: A Little Help needed moving to and from a bed to a chair (including a wheelchair)?: A Little Help needed standing up from a chair using your arms (e.g., wheelchair or bedside chair)?: A Little Help needed to walk in hospital room?: A Little Help needed climbing 3-5 steps with a railing? : A Lot 6 Click Score: 17    End of Session Equipment Utilized During Treatment: Gait belt Activity  Tolerance: Patient tolerated treatment well Patient left: in chair;with call bell/phone within reach;with chair alarm set;with family/visitor present   PT Visit Diagnosis: Difficulty in walking, not elsewhere classified (R26.2)     Time: 6045-4098 PT Time Calculation (min) (ACUTE ONLY): 8 min  Charges:  $Gait Training: 8-22 mins                         Faye Ramsay, PT Acute Rehabilitation  Office: [phone]

## 2023-06-15 DIAGNOSIS — G9340 Encephalopathy, unspecified: Secondary | ICD-10-CM | POA: Diagnosis not present

## 2023-06-15 LAB — CBC
HCT: 36.1 % (ref 36.0–46.0)
Hemoglobin: 11.2 g/dL — ABNORMAL LOW (ref 12.0–15.0)
MCH: 24.2 pg — ABNORMAL LOW (ref 26.0–34.0)
MCHC: 31 g/dL (ref 30.0–36.0)
MCV: 78.1 fL — ABNORMAL LOW (ref 80.0–100.0)
Platelets: 281 10*3/uL (ref 150–400)
RBC: 4.62 MIL/uL (ref 3.87–5.11)
RDW: 16.5 % — ABNORMAL HIGH (ref 11.5–15.5)
WBC: 8.1 10*3/uL (ref 4.0–10.5)
nRBC: 0 % (ref 0.0–0.2)

## 2023-06-15 LAB — BASIC METABOLIC PANEL
Anion gap: 9 (ref 5–15)
BUN: 21 mg/dL (ref 8–23)
CO2: 22 mmol/L (ref 22–32)
Calcium: 8.8 mg/dL — ABNORMAL LOW (ref 8.9–10.3)
Chloride: 106 mmol/L (ref 98–111)
Creatinine, Ser: 1.11 mg/dL — ABNORMAL HIGH (ref 0.44–1.00)
GFR, Estimated: 51 mL/min — ABNORMAL LOW (ref >60)
Glucose, Bld: 114 mg/dL — ABNORMAL HIGH (ref 70–99)
Potassium: 3.9 mmol/L (ref 3.5–5.1)
Sodium: 137 mmol/L (ref 135–145)

## 2023-06-15 MED ORDER — QUETIAPINE FUMARATE 25 MG PO TABS
50.0000 mg | ORAL_TABLET | Freq: Every day | ORAL | Status: DC
  Administered 2023-06-15: 50 mg via ORAL
  Filled 2023-06-15: qty 2

## 2023-06-15 MED ORDER — ENOXAPARIN SODIUM 40 MG/0.4ML IJ SOSY
40.0000 mg | PREFILLED_SYRINGE | INTRAMUSCULAR | Status: DC
  Administered 2023-06-15: 40 mg via SUBCUTANEOUS
  Filled 2023-06-15: qty 0.4

## 2023-06-15 NOTE — Progress Notes (Signed)
Patient confuse/impulsive, poor safety awareness, Not following  verbal cues.  Initiated Recruitment consultant at 2100

## 2023-06-15 NOTE — Care Management Important Message (Signed)
Important Message  Patient Details IM Letter given. Name: Zuma Hust MRN: [id number] Date of Birth: [birth year]   Medicare Important Message Given:  Yes     Caren Macadam 06/15/2023, 10:17 AM

## 2023-06-15 NOTE — Progress Notes (Signed)
PROGRESS NOTE    Katie [place]  ZOX:[id number] DOB: [birth year] DOA: 06/11/2023 PCP: Georgann Housekeeper, MD   Brief Narrative: 77 year old with past medical history significant for dementia with behavioral disturbance, CKD 3A, hyperlipidemia presents to the hospital due to decreased level of mentation.  Per daughter patient has dementia which is quite advanced, requiring assistance with all her ADLs.  Has significant memory impairment and limited conversation at baseline.  The day of admission patient sat outside under the covers past year in the heat for about 1 hour.  Patient was find  my neighbor with diminished responsiveness and difficult to arouse.   Assessment & Plan:   Principal Problem:   Acute encephalopathy Active Problems:   Dementia with behavioral disturbance (HCC)   Stage 3a chronic kidney disease (CKD) (HCC)   UTI (urinary tract infection)  1-UTI: Patient presented with confusion, UA with positive nitrates, 1-50 white blood cell. Urine culture; more than 100,000 colony E. Coli Treated with IV ceftriaxone for 2 days, she will be transition to oral keflex.   2-Acute metabolic encephalopathy with underlying dementia with behavioral disturbance: -In the setting of UTI and underlying dementia -Will add scheduled Seroquel earlier to help with sundowning, increase dose of seroquel she was agitated last night.   3-CKD 3A: Renal function stable monitor  4-Hypokalemia: Replace orally  5-Chronic microcytic anemia: Monitor hemoglobin, stable    See wound care documentation below  Pressure Injury 02/26/20 Sacrum Mid Stage 1 -  Intact skin with non-blanchable redness of a localized area usually over a bony prominence. (Active)  02/26/20 1444  Location: Sacrum  Location Orientation: Mid  Staging: Stage 1 -  Intact skin with non-blanchable redness of a localized area usually over a bony prominence.  Wound Description (Comments):   Present on Admission: Yes      Estimated body mass index is 24.33 kg/m as calculated from the following:   Height as of this encounter: 5\' 1"  (1.549 m).   Weight as of this encounter: 58.4 kg.   DVT prophylaxis: Lovenox Code Status: Full code Family Communication: no family at bedside.  Disposition Plan:  Status is: Inpatient Remains inpatient appropriate because: Management of UTI, encephalopathy     Consultants:  None  Procedures:  none  Antimicrobials:    Subjective: She is alert, confuse . Has sitter at bedside. She was agitated last night.   Objective: Vitals:   06/14/23 1408 06/14/23 1414 06/14/23 1937 06/15/23 0600  BP: (!) 116/104 (!) 157/82 (!) 146/68 (!) 140/60  Pulse: 84 90 (!) 102 90  Resp: 17  16 16   Temp: 97.8 F (36.6 C)  98.1 F (36.7 C) 97.8 F (36.6 C)  TempSrc: Oral  Oral Oral  SpO2: 98%  97% 93%  Weight:      Height:        Intake/Output Summary (Last 24 hours) at 06/15/2023 1419 Last data filed at 06/15/2023 1157 Gross per 24 hour  Intake 711 ml  Output --  Net 711 ml    Filed Weights   06/12/23 1540  Weight: 58.4 kg    Examination:  General exam: NAD Respiratory system: CTA Cardiovascular system: S 1, S 2 RRR Gastrointestinal system: ABS present, soft, nt Central nervous system: alert, confuse   Data Reviewed: I have personally reviewed following labs and imaging studies  CBC: Recent Labs  Lab 06/11/23 1901 06/12/23 0441 06/13/23 0459 06/15/23 0634  WBC 12.9* 10.6* 11.5* 8.1  NEUTROABS 10.1*  --   --   --  HGB 10.7* 10.7* 9.9* 11.2*  HCT 34.1* 34.5* 32.5* 36.1  MCV 79.1* 78.6* 80.8 78.1*  PLT 262 261 221 281    Basic Metabolic Panel: Recent Labs  Lab 06/11/23 1901 06/12/23 0701 06/13/23 0459 06/15/23 0634  NA 138 138 140 137  K 3.5 3.5 3.3* 3.9  CL 109 112* 110 106  CO2 23 18* 21* 22  GLUCOSE 125* 116* 101* 114*  BUN 19 14 17 21   CREATININE 1.38* 1.19* 1.24* 1.11*  CALCIUM 8.4* 8.5* 8.4* 8.8*  MG  --   --  1.9  --   PHOS   --   --  3.1  --     GFR: Estimated Creatinine Clearance: 34.3 mL/min (A) (by C-G formula based on SCr of 1.11 mg/dL (H)). Liver Function Tests: Recent Labs  Lab 06/11/23 1901 06/13/23 0459  AST 14* 13*  ALT 9 10  ALKPHOS 68 58  BILITOT 1.1 0.6  PROT 6.6 6.1*  ALBUMIN 3.3* 2.8*    No results for input(s): "LIPASE", "AMYLASE" in the last 168 hours. No results for input(s): "AMMONIA" in the last 168 hours. Coagulation Profile: No results for input(s): "INR", "PROTIME" in the last 168 hours. Cardiac Enzymes: Recent Labs  Lab 06/11/23 1901  CKTOTAL 24*    BNP (last 3 results) No results for input(s): "PROBNP" in the last 8760 hours. HbA1C: No results for input(s): "HGBA1C" in the last 72 hours. CBG: No results for input(s): "GLUCAP" in the last 168 hours. Lipid Profile: No results for input(s): "CHOL", "HDL", "LDLCALC", "TRIG", "CHOLHDL", "LDLDIRECT" in the last 72 hours. Thyroid Function Tests: No results for input(s): "TSH", "T4TOTAL", "FREET4", "T3FREE", "THYROIDAB" in the last 72 hours. Anemia Panel: No results for input(s): "VITAMINB12", "FOLATE", "FERRITIN", "TIBC", "IRON", "RETICCTPCT" in the last 72 hours. Sepsis Labs: No results for input(s): "PROCALCITON", "LATICACIDVEN" in the last 168 hours.  Recent Results (from the past 240 hour(s))  Urine Culture (for pregnant, neutropenic or urologic patients or patients with an indwelling urinary catheter)     Status: Abnormal   Collection Time: 06/11/23 10:35 PM   Specimen: Urine, Clean Catch  Result Value Ref Range Status   Specimen Description   Final    URINE, CLEAN CATCH Performed at [facility], [address], [place], [place] [address]    Special Requests   Final    NONE Performed at [facility], [address], [place], [place] [address]    Culture >=100,000 COLONIES/mL ESCHERICHIA COLI (A)  Final   Report Status 06/14/2023 FINAL  Final   Organism ID, Bacteria  ESCHERICHIA COLI (A)  Final      Susceptibility   Escherichia coli - MIC*    AMPICILLIN >=32 RESISTANT Resistant     CEFAZOLIN <=4 SENSITIVE Sensitive     CEFEPIME <=0.12 SENSITIVE Sensitive     CEFTRIAXONE <=0.25 SENSITIVE Sensitive     CIPROFLOXACIN <=0.25 SENSITIVE Sensitive     GENTAMICIN <=1 SENSITIVE Sensitive     IMIPENEM <=0.25 SENSITIVE Sensitive     NITROFURANTOIN <=16 SENSITIVE Sensitive     TRIMETH/SULFA <=20 SENSITIVE Sensitive     AMPICILLIN/SULBACTAM >=32 RESISTANT Resistant     PIP/TAZO <=4 SENSITIVE Sensitive     * >=100,000 COLONIES/mL ESCHERICHIA COLI         Radiology Studies: No results found.      Scheduled Meds:  cephALEXin  500 mg Oral Q12H   donepezil  10 mg Oral QHS   enoxaparin (LOVENOX) injection  30  mg Subcutaneous Q24H   pravastatin  20 mg Oral q1800   QUEtiapine  50 mg Oral q1800   Continuous Infusions:     LOS: 3 days    Time spent: 35 minutes    Maleena Eddleman A Najeeb Uptain, MD [facility]   If 7PM-7AM, please contact night-coverage [email or web address]  06/15/2023, 2:19 PM

## 2023-06-16 DIAGNOSIS — G9340 Encephalopathy, unspecified: Secondary | ICD-10-CM | POA: Diagnosis not present

## 2023-06-16 MED ORDER — QUETIAPINE FUMARATE 50 MG PO TABS: 50.0000 mg | ORAL_TABLET | Freq: Every day | ORAL | 1 refills | Status: DC

## 2023-06-16 MED ORDER — CEPHALEXIN 500 MG PO CAPS: 500.0000 mg | ORAL_CAPSULE | Freq: Two times a day (BID) | ORAL | 0 refills | Status: AC

## 2023-06-16 NOTE — Progress Notes (Signed)
Physical Therapy Treatment Patient Details Name: Katie [place] MRN: [id number] DOB: 1946/07/05 Today's Date: 06/16/2023   History of Present Illness 77 y.o. female with medical history significant for dementia with behavioral disturbance, CKD stage IIIa, HLD who presented to the ED for evaluation of decreased level of mentation on 06/11/23. Dx of acute encephalopathy, UTI.    PT Comments    Pt has met goals and will be discharged from acute PT.  She is ambulating 300' with RW and min guard.  Pt appears to be at baseline mobility and demonstrates safe RW use.  She needs supervision at all times due to baseline dementia.  No further skilled PT needs.  Recommend continued ambulation with nursing or mobility.    Recommendations for follow up therapy are one component of a multi-disciplinary discharge planning process, led by the attending physician.  Recommendations may be updated based on patient status, additional functional criteria and insurance authorization.  Follow Up Recommendations       Assistance Recommended at Discharge Frequent or constant Supervision/Assistance  Patient can return home with the following A little help with walking and/or transfers;A little help with bathing/dressing/bathroom;Assistance with cooking/housework;Assist for transportation;Help with stairs or ramp for entrance   Equipment Recommendations  None recommended by PT    Recommendations for Other Services       Precautions / Restrictions Precautions Precautions: Fall     Mobility  Bed Mobility               General bed mobility comments: oob in recliner    Transfers Overall transfer level: Needs assistance Equipment used: Rolling walker (2 wheels) Transfers: Sit to/from Stand Sit to Stand: Supervision           General transfer comment: Pt demonstrating safe hand placement with standing to RW and with return to sitting backed completely with RW then reached back without cues.     Ambulation/Gait Ambulation/Gait assistance: Min guard Gait Distance (Feet): 300 Feet Assistive device: Rolling walker (2 wheels) Gait Pattern/deviations: Step-through pattern, Decreased stride length Gait velocity: decreased     General Gait Details: Min guard for safety.  Steady with RW with good proximity.  Took a break looking out window and remembered to get RW to resume walking   Social research officer, government Rankin (Stroke Patients Only)       Balance Overall balance assessment: Needs assistance   Sitting balance-Leahy Scale: Good     Standing balance support: Reliant on assistive device for balance Standing balance-Leahy Scale: Poor Standing balance comment: steady with RW                            Cognition Arousal/Alertness: Awake/alert Behavior During Therapy: WFL for tasks assessed/performed Overall Cognitive Status: History of cognitive impairments - at baseline                                 General Comments: Follows simple commands, demonstrated safe RW use        Exercises      General Comments        Pertinent Vitals/Pain Pain Assessment Pain Assessment: No/denies pain    Home Living  Prior Function            PT Goals (current goals can now be found in the care plan section) Acute Rehab PT Goals PT Goal Formulation: All assessment and education complete, DC therapy Progress towards PT goals: Goals met/education completed, patient discharged from PT    Frequency           PT Plan Discharge plan needs to be updated    Co-evaluation              AM-PAC PT "6 Clicks" Mobility   Outcome Measure  Help needed turning from your back to your side while in a flat bed without using bedrails?: A Little Help needed moving from lying on your back to sitting on the side of a flat bed without using bedrails?: A Little Help needed moving to  and from a bed to a chair (including a wheelchair)?: A Little Help needed standing up from a chair using your arms (e.g., wheelchair or bedside chair)?: A Little Help needed to walk in hospital room?: A Little Help needed climbing 3-5 steps with a railing? : A Little 6 Click Score: 18    End of Session Equipment Utilized During Treatment: Gait belt Activity Tolerance: Patient tolerated treatment well Patient left: in chair;with call bell/phone within reach;with chair alarm set;with nursing/sitter in room Nurse Communication: Mobility status PT Visit Diagnosis: Difficulty in walking, not elsewhere classified (R26.2)     Time: 9147-8295 PT Time Calculation (min) (ACUTE ONLY): 17 min  Charges:  $Gait Training: 8-22 mins                     Anise Salvo, PT Acute Rehab [facility] [phone]    Rayetta Humphrey 06/16/2023, 1:11 PM

## 2023-06-16 NOTE — Discharge Summary (Signed)
Physician Discharge Summary   Patient: Katie [place] MRN: [id number] DOB: Jul 26, 1946  Admit date:     06/11/2023  Discharge date: 06/16/23  Discharge Physician: Alba Cory   PCP: Georgann Housekeeper, MD   Recommendations at discharge:    Further adjustment of medication for behavioral management of Dementia.  Consider palliative care out patient.  Follow resolution of UTI>   Discharge Diagnoses: Principal Problem:   Acute encephalopathy Active Problems:   Dementia with behavioral disturbance (HCC)   Stage 3a chronic kidney disease (CKD) (HCC)   UTI (urinary tract infection)  Resolved Problems:   * No resolved hospital problems. *  Hospital Course:  77 year old with past medical history significant for dementia with behavioral disturbance, CKD 3A, hyperlipidemia presents to the hospital due to decreased level of mentation. Per daughter patient has dementia which is quite advanced, requiring assistance with all her ADLs. Has significant memory impairment and limited conversation at baseline. The day of admission patient sat outside under the covers past year in the heat for about 1 hour. Patient was find my neighbor with diminished responsiveness and difficult to arouse.    Assessment and Plan: 1-UTI: Patient presented with confusion, UA with positive nitrates, 1-50 white blood cell. Urine culture; more than 100,000 colony E. Coli Treated with IV ceftriaxone for 2 days, she will be transition to oral keflex.  Discharge on 3 days of keflex.    2-Acute metabolic encephalopathy with underlying dementia with behavioral disturbance: -In the setting of UTI and underlying dementia -Will add scheduled Seroquel earlier to help with sundowning, increase dose of seroquel she was agitated last night.  Seroquel dose increase to 50 mg daily. She was able to sleep all night.  She is stable for discharge.  Will benefit from palliative care out patient.  SW for [facility] ordered.   3-CKD  3A: Renal function stable monitor   4-Hypokalemia: Replaced   5-Chronic microcytic anemia: Monitor hemoglobin, stable       See wound care documentation below   Pressure Injury 02/26/20 Sacrum Mid Stage 1 -  Intact skin with non-blanchable redness of a localized area usually over a bony prominence. (Active)  02/26/20 1444  Location: Sacrum  Location Orientation: Mid  Staging: Stage 1 -  Intact skin with non-blanchable redness of a localized area usually over a bony prominence.  Wound Description (Comments):   Present on Admission: Yes             Consultants: None Procedures performed: None Disposition: Home Diet recommendation:  Discharge Diet Orders (From admission, onward)     Start     Ordered   06/16/23 0000  Diet - low sodium heart healthy        06/16/23 1239           Cardiac diet DISCHARGE MEDICATION: Allergies as of 06/16/2023       Reactions   Penicillins Other (See Comments)   From childhood; reaction not recalled.        Medication List     STOP taking these medications    amLODipine 10 MG tablet Commonly known as: NORVASC   CALCIUM 600/VITAMIN D3 PO       TAKE these medications    cephALEXin 500 MG capsule Commonly known as: KEFLEX Take 1 capsule (500 mg total) by mouth every 12 (twelve) hours for 3 days.   donepezil 10 MG tablet Commonly known as: ARICEPT TAKE 1 TABLET BY MOUTH AT BEDTIME   lovastatin 20 MG tablet Commonly  known as: MEVACOR Take 20 mg by mouth at bedtime.   QUEtiapine 50 MG tablet Commonly known as: SEROQUEL Take 1 tablet (50 mg total) by mouth daily at 6 PM. What changed:  medication strength how much to take when to take this        Follow-up Information     [facility] Follow up.   Specialty: Home Health Services Why: Someone from [facility] will contact you in a few days to arrange start date and time for your Home Health Physical therapy, Aide and Social  worker. Contact information: [address] [place] [address] [phone]                Discharge Exam: Ceasar Mons Weights   06/12/23 1540  Weight: 58.4 kg   General; Alert  Condition at discharge: stable  The results of significant diagnostics from this hospitalization (including imaging, microbiology, ancillary and laboratory) are listed below for reference.   Imaging Studies: CT Head Wo Contrast  Result Date: 06/11/2023 CLINICAL DATA:  Mental status change, unknown cause Pt being outside for a long time and found unresponsive. Hx of dementia EXAM: CT HEAD WITHOUT CONTRAST TECHNIQUE: Contiguous axial images were obtained from the base of the skull through the vertex without intravenous contrast. RADIATION DOSE REDUCTION: This exam was performed according to the departmental dose-optimization program which includes automated exposure control, adjustment of the mA and/or kV according to patient size and/or use of iterative reconstruction technique. COMPARISON:  CT head 11/05/2018 FINDINGS: Brain: Cerebral ventricle sizes are concordant with the degree of cerebral volume loss. Patchy and confluent areas of decreased attenuation are noted throughout the deep and periventricular white matter of the cerebral hemispheres bilaterally, compatible with chronic microvascular ischemic disease. No evidence of large-territorial acute infarction. No parenchymal hemorrhage. No mass lesion. No extra-axial collection. No mass effect or midline shift. No hydrocephalus. Basilar cisterns are patent. Vascular: No hyperdense vessel. Atherosclerotic calcifications are present within the cavernous internal carotid arteries. Skull: No acute fracture or focal lesion. Sinuses/Orbits: Paranasal sinuses and mastoid air cells are clear. Bilateral lens replacement. Otherwise the orbits are unremarkable. Other: None. IMPRESSION: No acute intracranial abnormality. Electronically Signed   By: Tish Frederickson  M.D.   On: 06/11/2023 19:21    Microbiology: Results for orders placed or performed during the hospital encounter of 06/11/23  Urine Culture (for pregnant, neutropenic or urologic patients or patients with an indwelling urinary catheter)     Status: Abnormal   Collection Time: 06/11/23 10:35 PM   Specimen: Urine, Clean Catch  Result Value Ref Range Status   Specimen Description   Final    URINE, CLEAN CATCH Performed at [facility], [address] [place], [place] [address]    Special Requests   Final    NONE Performed at [facility], [address] [place], [place] [address]    Culture >=100,000 COLONIES/mL ESCHERICHIA COLI (A)  Final   Report Status 06/14/2023 FINAL  Final   Organism ID, Bacteria ESCHERICHIA COLI (A)  Final      Susceptibility   Escherichia coli - MIC*    AMPICILLIN >=32 RESISTANT Resistant     CEFAZOLIN <=4 SENSITIVE Sensitive     CEFEPIME <=0.12 SENSITIVE Sensitive     CEFTRIAXONE <=0.25 SENSITIVE Sensitive     CIPROFLOXACIN <=0.25 SENSITIVE Sensitive     GENTAMICIN <=1 SENSITIVE Sensitive     IMIPENEM <=0.25 SENSITIVE Sensitive     NITROFURANTOIN <=16  SENSITIVE Sensitive     TRIMETH/SULFA <=20 SENSITIVE Sensitive     AMPICILLIN/SULBACTAM >=32 RESISTANT Resistant     PIP/TAZO <=4 SENSITIVE Sensitive     * >=100,000 COLONIES/mL ESCHERICHIA COLI    Labs: CBC: Recent Labs  Lab 06/11/23 1901 06/12/23 0441 06/13/23 0459 06/15/23 0634  WBC 12.9* 10.6* 11.5* 8.1  NEUTROABS 10.1*  --   --   --   HGB 10.7* 10.7* 9.9* 11.2*  HCT 34.1* 34.5* 32.5* 36.1  MCV 79.1* 78.6* 80.8 78.1*  PLT 262 261 221 281   Basic Metabolic Panel: Recent Labs  Lab 06/11/23 1901 06/12/23 0701 06/13/23 0459 06/15/23 0634  NA 138 138 140 137  K 3.5 3.5 3.3* 3.9  CL 109 112* 110 106  CO2 23 18* 21* 22  GLUCOSE 125* 116* 101* 114*  BUN 19 14 17 21   CREATININE 1.38* 1.19* 1.24* 1.11*  CALCIUM 8.4* 8.5* 8.4* 8.8*  MG  --   --  1.9   --   PHOS  --   --  3.1  --    Liver Function Tests: Recent Labs  Lab 06/11/23 1901 06/13/23 0459  AST 14* 13*  ALT 9 10  ALKPHOS 68 58  BILITOT 1.1 0.6  PROT 6.6 6.1*  ALBUMIN 3.3* 2.8*   CBG: No results for input(s): "GLUCAP" in the last 168 hours.  Discharge time spent: greater than 30 minutes.  Signed: Alba Cory, MD [facility] 06/16/2023

## 2023-06-24 IMAGING — MG MM DIGITAL SCREENING BILAT W/ TOMO AND CAD
8 series · 8 of 24 positions shown · non-contrast
Comparison: Previous exam(s).

CLINICAL DATA: Screening.

EXAM:
DIGITAL SCREENING BILATERAL MAMMOGRAM WITH TOMOSYNTHESIS AND CAD
TECHNIQUE: Bilateral screening digital craniocaudal and mediolateral oblique
mammograms were obtained. Bilateral screening digital breast
tomosynthesis was performed. The images were evaluated with
computer-aided detection.

[L CC synth-2D]
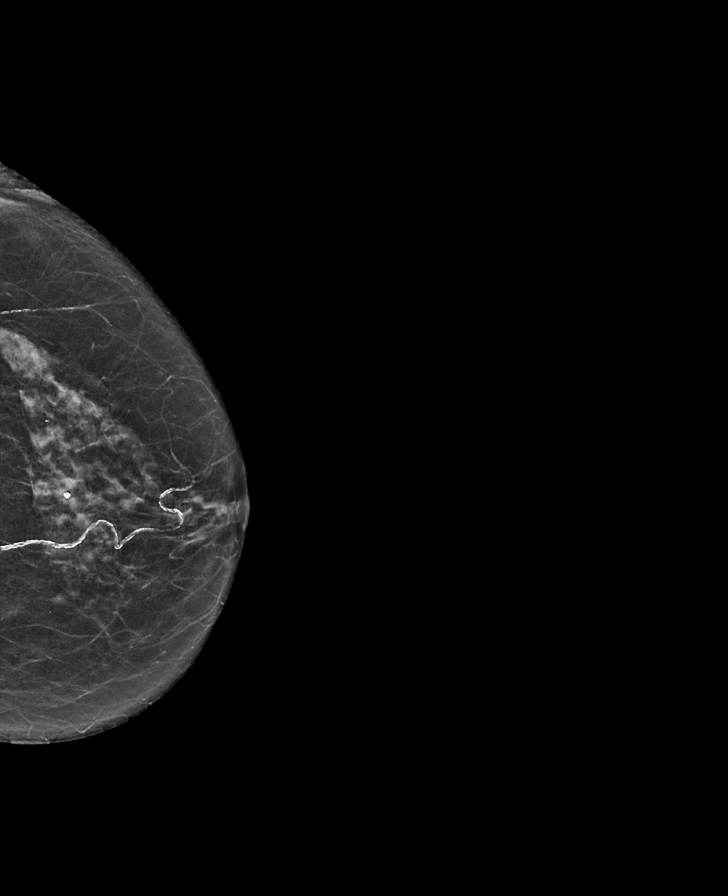

[R CC synth-2D]
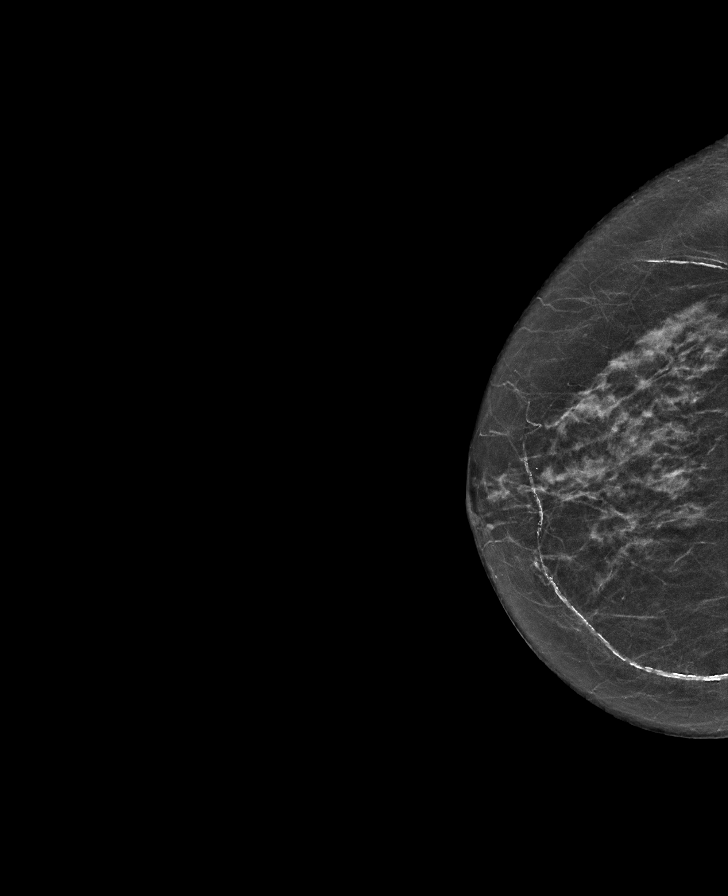

[R MLO synth-2D]
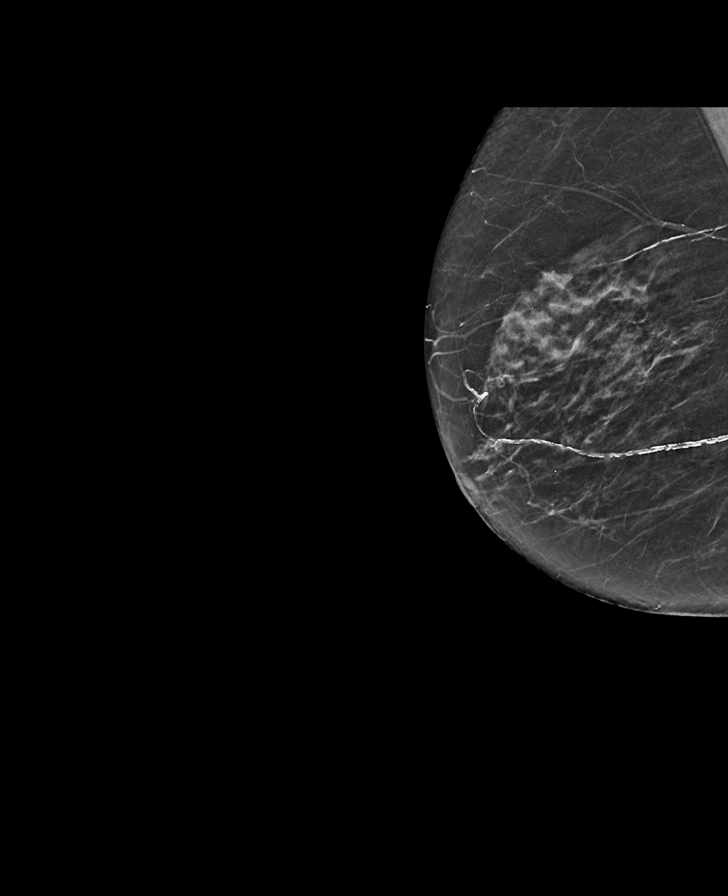

[L MLO synth-2D]
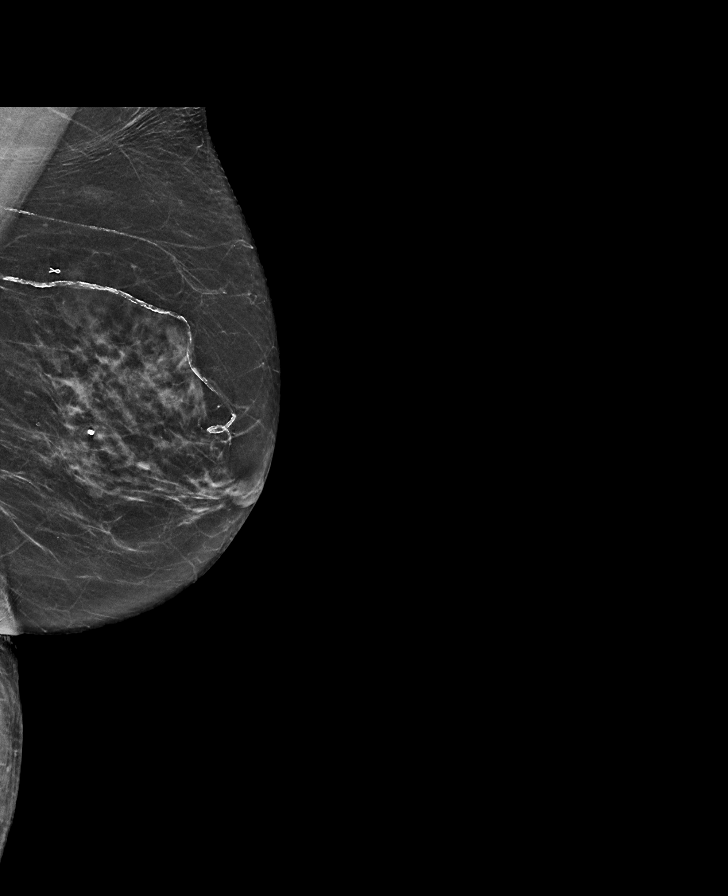

[L MLO tomo · tomo slice 29/58.0]
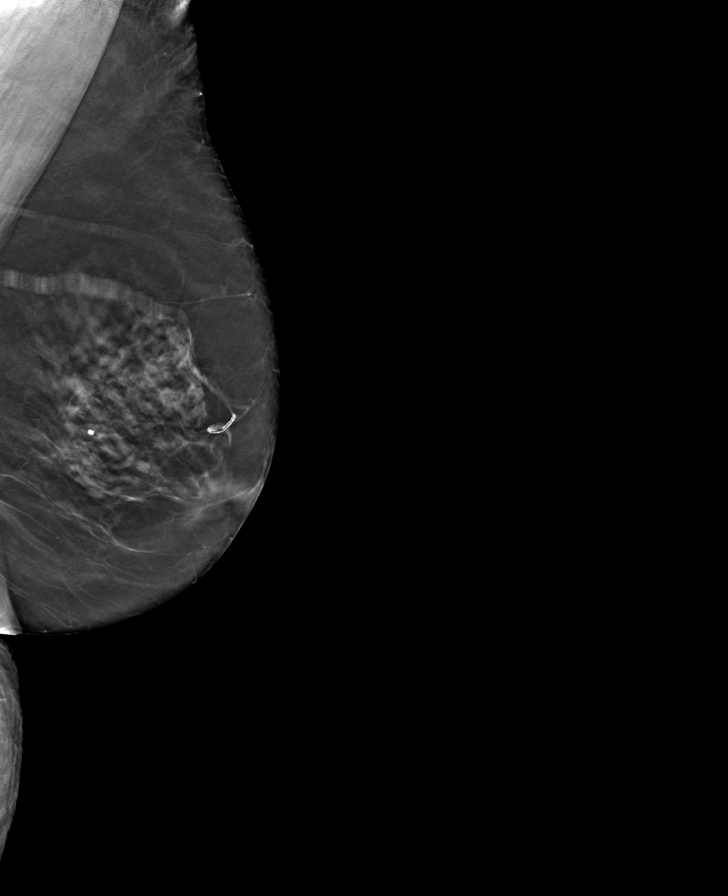

[L CC tomo · tomo slice 27/54.0]
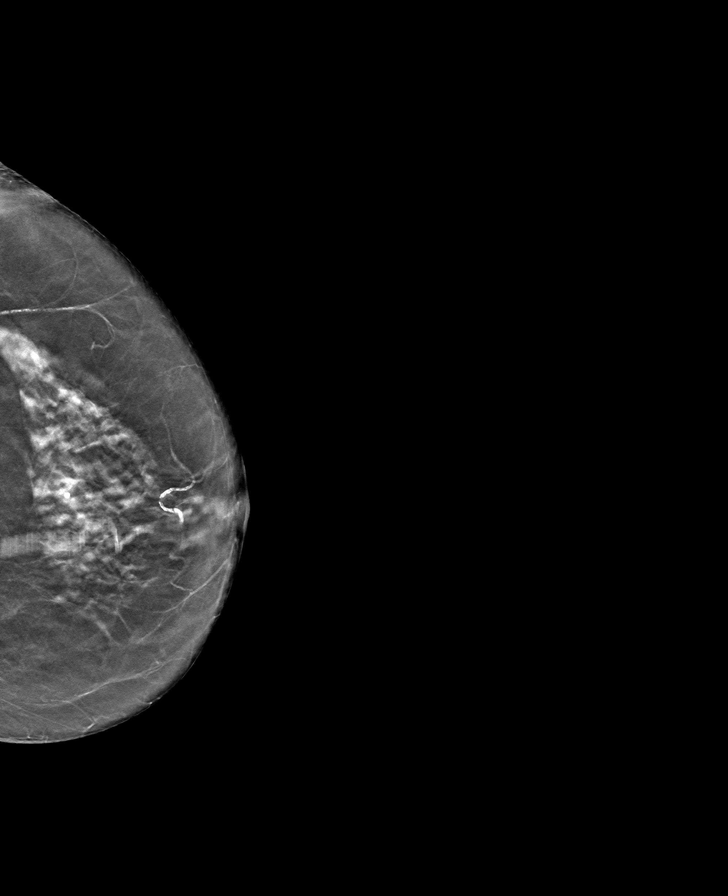

[R MLO tomo · tomo slice 29/57.0]
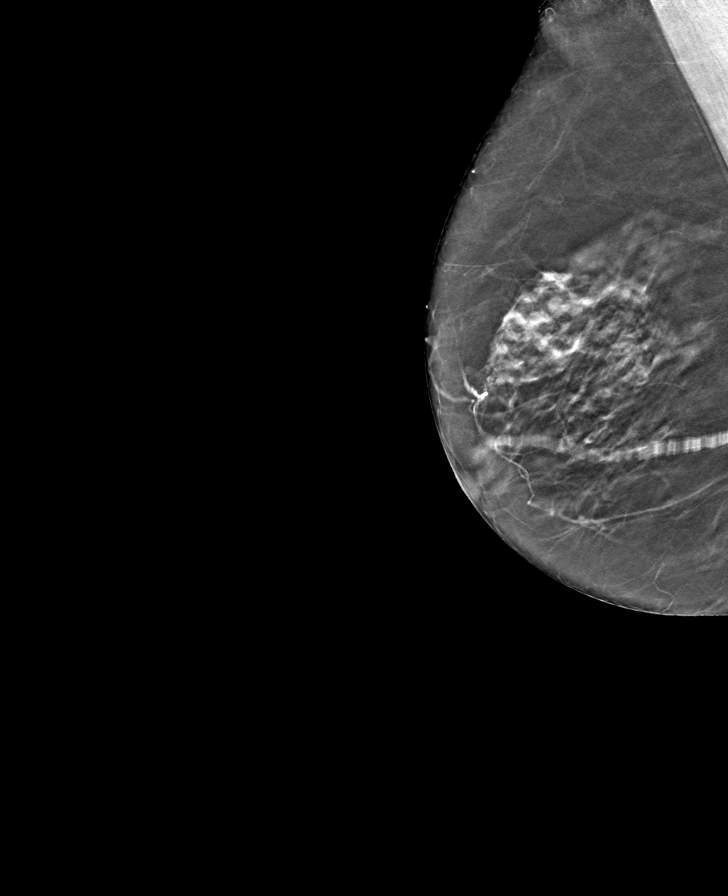

[R CC tomo · tomo slice 29/57.0]
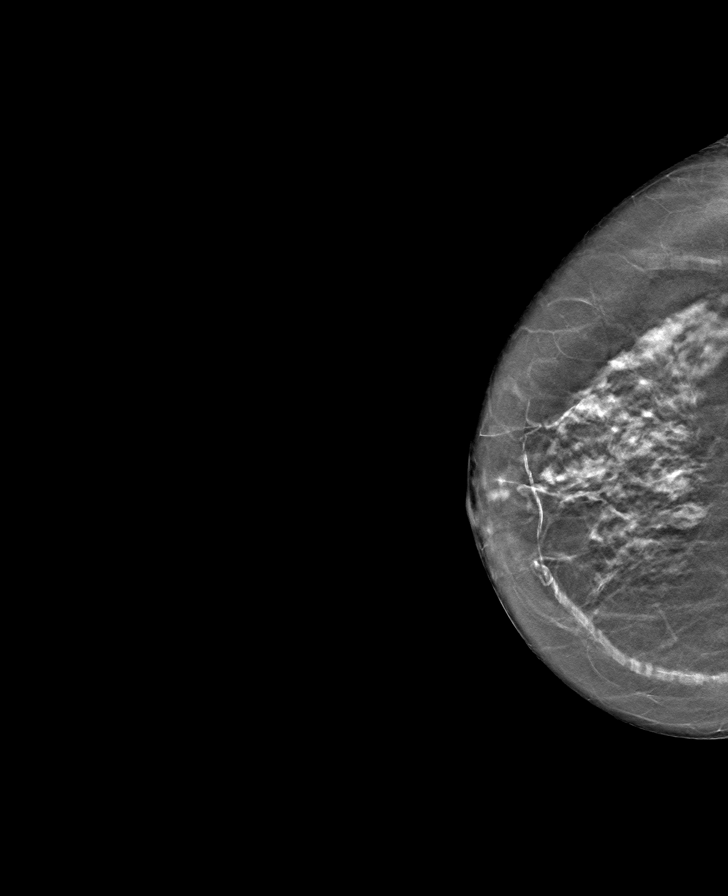

[8 of 24 positions shown; findings below may reference images not displayed]

ACR Breast Density Category c: The breast tissue is heterogeneously
dense, which may obscure small masses.
FINDINGS: There are no findings suspicious for malignancy.
IMPRESSION: No mammographic evidence of malignancy. A result letter of this
screening mammogram will be mailed directly to the patient.

RECOMMENDATION:
Screening mammogram in one year. (Code:[id number])

BI-RADS CATEGORY  1: Negative.

## 2023-06-29 DIAGNOSIS — F03C18 Unspecified dementia, severe, with other behavioral disturbance: Secondary | ICD-10-CM | POA: Diagnosis not present

## 2023-06-29 DIAGNOSIS — I10 Essential (primary) hypertension: Secondary | ICD-10-CM | POA: Diagnosis not present

## 2023-06-29 DIAGNOSIS — N390 Urinary tract infection, site not specified: Secondary | ICD-10-CM | POA: Diagnosis not present

## 2023-07-05 ENCOUNTER — Other Ambulatory Visit: Payer: Self-pay

## 2023-07-05 ENCOUNTER — Emergency Department (HOSPITAL_COMMUNITY)
Admission: EM | Admit: 2023-07-05 | Discharge: 2023-07-05 | Disposition: A | Discharge status: Home / Self Care | Payer: 59 | Attending: Emergency Medicine | Admitting: Emergency Medicine

## 2023-07-05 ENCOUNTER — Encounter (HOSPITAL_COMMUNITY): Payer: Self-pay

## 2023-07-05 DIAGNOSIS — W108XXA Fall (on) (from) other stairs and steps, initial encounter: Secondary | ICD-10-CM | POA: Diagnosis not present

## 2023-07-05 DIAGNOSIS — Z23 Encounter for immunization: Secondary | ICD-10-CM | POA: Insufficient documentation

## 2023-07-05 DIAGNOSIS — S81811A Laceration without foreign body, right lower leg, initial encounter: Secondary | ICD-10-CM | POA: Diagnosis not present

## 2023-07-05 MED ORDER — LORAZEPAM 1 MG PO TABS
1.0000 mg | ORAL_TABLET | Freq: Once | ORAL | Status: AC
  Administered 2023-07-05: 1 mg via ORAL
  Filled 2023-07-05: qty 1

## 2023-07-05 MED ORDER — LIDOCAINE-EPINEPHRINE (PF) 2 %-1:200000 IJ SOLN
20.0000 mL | Freq: Once | INTRAMUSCULAR | Status: AC
  Administered 2023-07-05: 10 mL
  Filled 2023-07-05: qty 20

## 2023-07-05 MED ORDER — BACITRACIN ZINC 500 UNIT/GM EX OINT
TOPICAL_OINTMENT | CUTANEOUS | Status: AC
  Administered 2023-07-05: 4 via TOPICAL
  Filled 2023-07-05: qty 3.6

## 2023-07-05 MED ORDER — BACITRACIN ZINC 500 UNIT/GM EX OINT: TOPICAL_OINTMENT | CUTANEOUS | Status: DC | PRN

## 2023-07-05 MED ORDER — TETANUS-DIPHTH-ACELL PERTUSSIS 5-2.5-18.5 LF-MCG/0.5 IM SUSY
0.5000 mL | PREFILLED_SYRINGE | Freq: Once | INTRAMUSCULAR | Status: AC
  Administered 2023-07-05: 0.5 mL via INTRAMUSCULAR
  Filled 2023-07-05: qty 0.5

## 2023-07-05 NOTE — Discharge Instructions (Signed)
Keep the wound clean and dry.  If you notice any redness or discharge from the wound, you should be reevaluated.  He need to follow-up with your doctor in about 10 days for wound check and probable suture removal.

## 2023-07-05 NOTE — ED Notes (Signed)
Pt family/caregivers provided with extra non-adherent dressing, roller gauze, and tape. Instructed them on wound care, bandage applications, going forward. Pt caregivers verbalized understanding and had no further questions or concerns.

## 2023-07-05 NOTE — ED Triage Notes (Signed)
BIB family after a fall on the porch. Pt fell on last step of porch and hit right shin on the corner of the concrete step. Pt has skin tear to outer right shin that is about 12 inches. No LOC, pt did not hit head. No other injury.

## 2023-07-05 NOTE — ED Provider Notes (Signed)
[facility]-EMERGENCY DEPT [facility] Emergency Department Provider Note MRN:  [id number]  Arrival date & time: 07/05/23     Chief Complaint   Skin Tear and Fall   History of Present Illness   Katie Jackson is a 77 y.o. year-old female presents to the ED with chief complaint of laceration to right leg.  She is accompanied by her family members, who report that she missed the last step on the porch and when she was standing back up she cut the right lower leg on the concrete step.  Family states that she has been walking since the fall.  She didn't hit her head.  Family denies any other injuries.  History provided by patient.   Review of Systems  Pertinent positive and negative review of systems noted in HPI.    Physical Exam   Vitals:   07/05/23 2152  BP: (!) 140/115  Pulse: 99  Resp: 16  Temp: 98.6 F (37 C)  SpO2: 100%    CONSTITUTIONAL:  well-appearing, NAD NEURO:  Alert and oriented x 3, CN 3-12 grossly intact EYES:  eyes equal and reactive ENT/NECK:  Supple, no stridor  CARDIO:  normal rate, regular rhythm, appears well-perfused  PULM:  No respiratory distress, CTAB GI/GU:  non-distended,  MSK/SPINE:  No gross deformities, no edema, moves all extremities  SKIN:  15 cm laceration to RLE    *Additional and/or pertinent findings included in MDM below  Diagnostic and Interventional Summary    EKG Interpretation Date/Time:    Ventricular Rate:    PR Interval:    QRS Duration:    QT Interval:    QTC Calculation:   R Axis:      Text Interpretation:         Labs Reviewed - No data to display  No orders to display    Medications  bacitracin ointment (4 Applications Topical Given 07/05/23 2312)  Tdap (BOOSTRIX) injection 0.5 mL (0.5 mLs Intramuscular Given 07/05/23 2235)  lidocaine-EPINEPHrine (XYLOCAINE W/EPI) 2 %-1:200000 (PF) injection 20 mL (10 mLs Infiltration Given 07/05/23 2311)  LORazepam (ATIVAN) tablet 1 mg (1 mg Oral Given 07/05/23 2228)      Procedures  /  Critical Care .Marland KitchenLaceration Repair  Date/Time: 07/05/2023 11:12 PM  Performed by: Roxy Horseman, PA-C Authorized by: Roxy Horseman, PA-C   Consent:    Consent obtained:  Verbal   Consent given by:  Healthcare agent   Risks, benefits, and alternatives were discussed: yes     Risks discussed:  Infection, pain, poor cosmetic result, need for additional repair, poor wound healing and vascular damage   Alternatives discussed:  No treatment and referral Universal protocol:    Procedure explained and questions answered to patient or proxy's satisfaction: yes     Relevant documents present and verified: yes     Test results available: yes     Imaging studies available: yes     Required blood products, implants, devices, and special equipment available: yes     Site/side marked: yes     Immediately prior to procedure, a time out was called: yes     Patient identity confirmed:  Arm band Anesthesia:    Anesthesia method:  Local infiltration   Local anesthetic:  Lidocaine 1% WITH epi Laceration details:    Location:  Leg   Leg location:  R lower leg   Length (cm):  15 Pre-procedure details:    Preparation:  Patient was prepped and draped in usual sterile fashion Exploration:  Wound exploration: wound explored through full range of motion and entire depth of wound visualized     Contaminated: no   Treatment:    Area cleansed with:  Saline   Amount of cleaning:  Standard   Irrigation solution:  Sterile saline Skin repair:    Repair method:  Sutures and Steri-Strips   Suture size:  4-0   Suture material:  Prolene   Suture technique:  Running locked   Number of sutures:  22   Number of Steri-Strips:  6 Approximation:    Approximation:  Close Repair type:    Repair type:  Intermediate Post-procedure details:    Dressing:  Non-adherent dressing, antibiotic ointment, splint for protection and tube gauze   Procedure completion:  Tolerated well, no immediate  complications Comments:     Due to the thin nature of the scan, Steri-Strips were applied on top of benzoin to assist with strengthening the closure.  Sutures were run through the Steri-Strips approximating wound margins.  I discussed with the patient's family members that this wound is at increased risk for complication and she will need to follow-up closely with her primary care doctor and may need to follow-up with wound care as well.  She was placed in a cam boot due to family's concern about her picking at the wound.   ED Course and Medical Decision Making  I have reviewed the triage vital signs, the nursing notes, and pertinent available records from the EMR.  Social Determinants Affecting Complexity of Care: Patient has no clinically significant social determinants affecting this chief complaint..   ED Course:    Medical Decision Making Patient here with laceration to right lower extremity.  She has been able to ambulate since the initial fall.  She did not hit her head.  I have low suspicion of fracture.  She does not exhibit any bony tenderness over the medial or lateral malleolus.  She is able to range the foot and leg normally.  She has intact distal pulses and brisk capillary refill.  I was able to repair the wound with some modifications with steri-strips.   Risk OTC drugs. Prescription drug management.         Consultants: No consultations were needed in caring for this patient.   Treatment and Plan: Emergency department workup does not suggest an emergent condition requiring admission or immediate intervention beyond  what has been performed at this time. The patient is safe for discharge and has  been instructed to return immediately for worsening symptoms, change in  symptoms or any other concerns    Final Clinical Impressions(s) / ED Diagnoses     ICD-10-CM   1. Laceration of right lower extremity, initial encounter  S81.811A       ED Discharge Orders      None         Discharge Instructions Discussed with and Provided to Patient:     Discharge Instructions      Keep the wound clean and dry.  If you notice any redness or discharge from the wound, you should be reevaluated.  He need to follow-up with your doctor in about 10 days for wound check and probable suture removal.       Roxy Horseman, PA-C 07/05/23 2319    Arby Barrette, MD 07/07/23 1053

## 2023-07-06 DIAGNOSIS — S81819A Laceration without foreign body, unspecified lower leg, initial encounter: Secondary | ICD-10-CM | POA: Diagnosis not present

## 2023-07-07 ENCOUNTER — Other Ambulatory Visit: Payer: Self-pay

## 2023-07-07 ENCOUNTER — Emergency Department (HOSPITAL_COMMUNITY)
Admission: EM | Admit: 2023-07-07 | Discharge: 2023-07-08 | Disposition: A | Discharge status: Home / Self Care | Payer: 59 | Attending: Emergency Medicine | Admitting: Emergency Medicine

## 2023-07-07 ENCOUNTER — Encounter (HOSPITAL_COMMUNITY): Payer: Self-pay

## 2023-07-07 DIAGNOSIS — L03115 Cellulitis of right lower limb: Secondary | ICD-10-CM | POA: Diagnosis not present

## 2023-07-07 DIAGNOSIS — I2510 Atherosclerotic heart disease of native coronary artery without angina pectoris: Secondary | ICD-10-CM | POA: Diagnosis not present

## 2023-07-07 DIAGNOSIS — Z79899 Other long term (current) drug therapy: Secondary | ICD-10-CM | POA: Insufficient documentation

## 2023-07-07 DIAGNOSIS — F0280 Dementia in other diseases classified elsewhere without behavioral disturbance: Secondary | ICD-10-CM | POA: Insufficient documentation

## 2023-07-07 DIAGNOSIS — R2241 Localized swelling, mass and lump, right lower limb: Secondary | ICD-10-CM | POA: Diagnosis present

## 2023-07-07 DIAGNOSIS — G309 Alzheimer's disease, unspecified: Secondary | ICD-10-CM | POA: Insufficient documentation

## 2023-07-07 DIAGNOSIS — N189 Chronic kidney disease, unspecified: Secondary | ICD-10-CM | POA: Diagnosis not present

## 2023-07-07 DIAGNOSIS — J449 Chronic obstructive pulmonary disease, unspecified: Secondary | ICD-10-CM | POA: Insufficient documentation

## 2023-07-07 DIAGNOSIS — I129 Hypertensive chronic kidney disease with stage 1 through stage 4 chronic kidney disease, or unspecified chronic kidney disease: Secondary | ICD-10-CM | POA: Diagnosis not present

## 2023-07-07 MED ORDER — SODIUM CHLORIDE 0.9 % IV SOLN
1.0000 g | Freq: Once | INTRAVENOUS | Status: AC
  Administered 2023-07-08: 1 g via INTRAVENOUS
  Filled 2023-07-07: qty 10

## 2023-07-07 NOTE — ED Triage Notes (Signed)
Daughter denies fever or smell.

## 2023-07-07 NOTE — ED Triage Notes (Signed)
Pt arrived via POV with daughter for Left leg redness, pt has dementia.

## 2023-07-07 NOTE — ED Provider Notes (Signed)
[place] EMERGENCY DEPARTMENT AT [facility] Provider Note   CSN: [id number] Arrival date & time: 07/07/23  2149     History {Add pertinent medical, surgical, social history, OB history to HPI:1} Chief Complaint  Patient presents with   Leg Swelling    Katie Jackson is a 77 y.o. female.  HPI   Patient with medical history including hypertension, Alzheimer's, CAD, COPD, presenting with complaints of wound check.  HPI was collected by daughter who is at bedside, daughter states that patient had a fall 2 days ago, and received sutures at the ED, she states that she was told to bring the patient back in if she started noticed any redness.  She states that when she took the boot off today she noticed redness moving up her shin, surgery brought here for further evaluation, patient had any fevers chills, no real complaints, there is been no noted drainage or discharge coming from the wound, occasional bleeding.  There is been no other complaints.  Reviewed patient's chart was seen on the 17th, received a total of 15 sutures, and was discharged home.  Patient is following up with her primary doctor on the 22nd for further evaluation.  Home Medications Prior to Admission medications   Medication Sig Start Date End Date Taking? Authorizing Provider  amLODipine (NORVASC) 10 MG tablet Take 10 mg by mouth daily.    [provider]  donepezil (ARICEPT) 10 MG tablet TAKE 1 TABLET BY MOUTH AT BEDTIME 05/09/23   Wertman, Sung Amabile, PA-C  QUEtiapine (SEROQUEL) 50 MG tablet Take 1 tablet (50 mg total) by mouth daily at 6 PM. Patient taking differently: Take 50 mg by mouth at bedtime. 06/16/23   Regalado, Jon Billings A, MD      Allergies    Penicillins    Review of Systems   Review of Systems  Unable to perform ROS: Dementia    Physical Exam Updated Vital Signs BP 137/79 (BP Location: Right Arm)   Pulse 96   Temp 98.1 F (36.7 C) (Oral)   Resp 18   Ht 5\' 1"  (1.549 m)    Wt 58.1 kg   SpO2 96%   BMI 24.19 kg/m  Physical Exam Vitals and nursing note reviewed.  Constitutional:      General: She is not in acute distress.    Appearance: She is not ill-appearing.  HENT:     Head: Normocephalic and atraumatic.     Nose: No congestion.  Eyes:     Conjunctiva/sclera: Conjunctivae normal.  Cardiovascular:     Rate and Rhythm: Normal rate and regular rhythm.     Pulses: Normal pulses.  Pulmonary:     Effort: Pulmonary effort is normal.  Musculoskeletal:     Comments: Focused exam of the right lower extremity reveals a 15 cm laceration that spans from the anterior aspect the distal one third of the tibia down to the lateral malleolus, sutures are intact, no noted drainage or discharge present, there is slight erythema present around the wound itself, wound is slightly warm to the touch, no fluctuance or induration noted, 2+ dorsal pedal pulses, 2-second cap refill, sensation tact light touch, gross motor function intact, please see picture for full detail  Skin:    General: Skin is warm and dry.  Neurological:     Mental Status: She is alert.  Psychiatric:        Mood and Affect: Mood normal.        ED Results / Procedures /  Treatments   Labs (all labs ordered are listed, but only abnormal results are displayed) Labs Reviewed - No data to display  EKG None  Radiology No results found.  Procedures Procedures  {Document cardiac monitor, telemetry assessment procedure when appropriate:1}  Medications Ordered in ED Medications  cefTRIAXone (ROCEPHIN) 1 g in sodium chloride 0.9 % 100 mL IVPB (has no administration in time range)    ED Course/ Medical Decision Making/ A&P   {   Click here for ABCD2, HEART and other calculatorsREFRESH Note before signing :1}                          Medical Decision Making  This patient presents to the ED for concern of wound check, this involves an extensive number of treatment options, and is a complaint  that carries with it a high risk of complications and morbidity.  The differential diagnosis includes sepsis, cellulitis, necrotizing fasciitis,    Additional history obtained:  Additional history obtained from daughter at bedside External records from outside source obtained and reviewed including ER note   Co morbidities that complicate the patient evaluation  CKD  Social Determinants of Health:  Dementia    Lab Tests:  I Ordered, and personally interpreted labs.  The pertinent results include: n/a   Imaging Studies ordered:  I ordered imaging studies including n/a I independently visualized and interpreted imaging which showed n/a I agree with the radiologist interpretation   Cardiac Monitoring:  The patient was maintained on a cardiac monitor.  I personally viewed and interpreted the cardiac monitored which showed an underlying rhythm of: n/a   Medicines ordered and prescription drug management:  I ordered medication including ceftriaxone I have reviewed the patients home medicines and have made adjustments as needed  Critical Interventions:  N/a   Reevaluation:  Presents for a wound check, she has noted erythema around the wound itself, she is nontoxic-appearing, will provide antibiotics, continue to monitor.  Patient tolerated the antibiotics well, both patient and family are in agreement discharge at this time.  Consultations Obtained:  N/a    Test Considered:  N/a    Rule out Doubt sepsis as patient is nontoxic-appearing, vital signs are reassuring.  I doubt necrotizing fasciitis presentation atypical etiology.  I have low suspicion patient needs to be admitted for IV antibiotics and observation as she has not failed outpatient therapy, she is nontoxic-appearing, afebrile, nontachycardic, she is tolerating p.o.    Dispostion and problem list  After consideration of the diagnostic results and the patients response to treatment, I feel  that the patent would benefit from discharge.  Cellulitis-patient has early evidence of cellulitis, she was given dose of ceftriaxone here in the emergency department, will discharge home on continue antibiotics, have her follow-up with her primary doctor for further assessment strict return precautions.      {Document critical care time when appropriate:1} {Document review of labs and clinical decision tools ie heart score, Chads2Vasc2 etc:1}  {Document your independent review of radiology images, and any outside records:1} {Document your discussion with family members, caretakers, and with consultants:1} {Document social determinants of health affecting pt's care:1} {Document your decision making why or why not admission, treatments were needed:1} Final Clinical Impression(s) / ED Diagnoses Final diagnoses:  None    Rx / DC Orders ED Discharge Orders     None

## 2023-07-08 DIAGNOSIS — L03115 Cellulitis of right lower limb: Secondary | ICD-10-CM | POA: Diagnosis not present

## 2023-07-08 MED ORDER — CEPHALEXIN 500 MG PO CAPS: 500.0000 mg | ORAL_CAPSULE | Freq: Two times a day (BID) | ORAL | 0 refills | Status: AC

## 2023-07-08 NOTE — Discharge Instructions (Addendum)
You have an infection of your right lower leg, so antibiotics please take as prescribed.  I recommend changing the dressings at least once daily, I would rinse it off pat dry and then add on the new dressings.  May use over-the-counter pain medication as needed.  If you note after 3 to 4 days of antibiotic use or symptoms are worsening, you have worsening redness, develop fevers, on able to tolerate oral intake, you must come back in for further assessment.  Please note they need to have your sutures removed within 8 to 10 days, please follow-up with your primary doctor on the 28th.

## 2023-07-17 DIAGNOSIS — E782 Mixed hyperlipidemia: Secondary | ICD-10-CM | POA: Diagnosis not present

## 2023-07-17 DIAGNOSIS — N1831 Chronic kidney disease, stage 3a: Secondary | ICD-10-CM | POA: Diagnosis not present

## 2023-07-17 DIAGNOSIS — L0390 Cellulitis, unspecified: Secondary | ICD-10-CM | POA: Diagnosis not present

## 2023-07-17 DIAGNOSIS — E46 Unspecified protein-calorie malnutrition: Secondary | ICD-10-CM | POA: Diagnosis not present

## 2023-07-17 DIAGNOSIS — J449 Chronic obstructive pulmonary disease, unspecified: Secondary | ICD-10-CM | POA: Diagnosis not present

## 2023-07-17 DIAGNOSIS — R7303 Prediabetes: Secondary | ICD-10-CM | POA: Diagnosis not present

## 2023-07-17 DIAGNOSIS — Z4802 Encounter for removal of sutures: Secondary | ICD-10-CM | POA: Diagnosis not present

## 2023-07-17 DIAGNOSIS — I10 Essential (primary) hypertension: Secondary | ICD-10-CM | POA: Diagnosis not present

## 2023-07-17 DIAGNOSIS — T148XXA Other injury of unspecified body region, initial encounter: Secondary | ICD-10-CM | POA: Diagnosis not present

## 2023-07-25 DIAGNOSIS — R8290 Unspecified abnormal findings in urine: Secondary | ICD-10-CM | POA: Diagnosis not present

## 2023-07-25 DIAGNOSIS — L0390 Cellulitis, unspecified: Secondary | ICD-10-CM | POA: Diagnosis not present

## 2023-08-05 ENCOUNTER — Other Ambulatory Visit: Payer: Self-pay | Admitting: Physician Assistant

## 2023-08-07 DIAGNOSIS — N390 Urinary tract infection, site not specified: Secondary | ICD-10-CM | POA: Diagnosis not present

## 2023-08-22 DIAGNOSIS — N390 Urinary tract infection, site not specified: Secondary | ICD-10-CM | POA: Diagnosis not present

## 2023-09-11 DIAGNOSIS — N390 Urinary tract infection, site not specified: Secondary | ICD-10-CM | POA: Diagnosis not present

## 2023-09-27 DIAGNOSIS — N3020 Other chronic cystitis without hematuria: Secondary | ICD-10-CM | POA: Diagnosis not present

## 2023-10-11 ENCOUNTER — Encounter: Payer: Self-pay | Admitting: Physician Assistant

## 2023-10-11 ENCOUNTER — Ambulatory Visit (INDEPENDENT_AMBULATORY_CARE_PROVIDER_SITE_OTHER): Payer: 59 | Admitting: Physician Assistant

## 2023-10-11 VITALS — BP 134/70 | HR 74 | Resp 18 | Ht 63.0 in

## 2023-10-11 DIAGNOSIS — G301 Alzheimer's disease with late onset: Secondary | ICD-10-CM

## 2023-10-11 DIAGNOSIS — F02818 Dementia in other diseases classified elsewhere, unspecified severity, with other behavioral disturbance: Secondary | ICD-10-CM | POA: Diagnosis not present

## 2023-10-11 NOTE — Progress Notes (Signed)
Assessment/Plan:   Dementia likely due to Alzheimer's Disease   Katie Jackson is a very pleasant 77 y.o. RH female with a history of hypertension, hyperlipidemia, with moderate Alzheimer's dementia with behavioral changes seen today in follow up for memory loss.  She is no longer on antidementia medications given progression of the disease. She needs assistance with ADLs. Family entertaining memory care    Follow up in  6 months. Discontinue donepezil as no longer therapeutic  Recommend good control of her cardiovascular risk factors Continue to control mood as per PCP Family entertaining memory care for social and cognitive stimulation as well as for safety.    Subjective:    This patient is accompanied in the office by her daughter who supplements the history.  Previous records as well as any outside records available were reviewed prior to todays visit. Patient was last seen on 04/11/2023    Any changes in memory since last visit? " Worse , she don't have no vocabulary no more ". Sometimes she cannot remember the family member names, especially her daughter.  She needs more assistance with ADLs than before. repeats oneself?  Unable to correct conversation.  Favorite words are "Damn and No" Disoriented when walking into a room? Denies, does not leave the area   Leaving objects in unusual places?  Moves objects frequently, clothes out of the drawer, sometimes she puts them in the toilet.   Wandering behavior?  Denies.   Any personality changes since last visit?  denies.  As before, she may have moments of aggressiveness, but she is on Seroquel which seems to help.   Any worsening depression?:  Denies.   Hallucinations or paranoia?  Denies.   Seizures? denies    Any sleep changes?  As before, she sleeps during the day more than at night but better with the sleep aid.  Family denies sleepwalking  Sleep apnea?   Denies.   Any hygiene concerns?  As prior, she refuses to  bathe Independent of bathing and dressing?  Daughter assists with clothing and dressing her otherwise she will put 3 shirts at once or towel as a pad. Does the patient needs help with medications? Daughter  is in charge   Who is in charge of the finances? Daughter  is in charge     Any changes in appetite?  Denies.     Patient have trouble swallowing? Denies.   Does the patient cook? No Any headaches?   denies   Chronic back pain  denies   Ambulates with difficulty? Denies.    Recent falls or head injuries? She had a mechanical fall at her daughter's  house, hitting the  knees but lost balance and fell to the R  developed into cellulitis  requiring antibiotics. No LOC or head injury  Unilateral weakness, numbness or tingling? denies   Any tremors?  Denies   Any anosmia?  Denies   Any incontinence of urine?  Endorsed, wears diapers.  She has frequent UTIs, still has lingering ymptoms. Any bowel dysfunction?   Denies      Patient lives with daughter, family considering memory care.  Does the patient drive? No longer drives     History on Initial Assessment 08/02/2018: This is a 77 year old right-handed woman with a history of hypertension, hyperlipidemia, presenting for evaluation of dementia. She thinks her memory is "pretty well." Her daughter started noticing memory changes a couple of years ago, worse the past year. She lives alone in  a senior citizen apartment complex. Her daughter reports difficulties with medications. She was having recurrent UTIs, she said she was taking them, but when she had another UTI in June 2019, her daughter found a full bottle of antibiotic from December 2018. Her daughter started fixing her pillbox but she still does not take it right. Her daughter reports that she has been a victim of several scams, she has joined [facility] and does not know what the policy is for. She joined [employer], her daughter discontinued the membership, then she joined again. She was  forgetting to pay her bills and then overpaying 2 months ago. She has mail everywhere. Her daughter has been trying to help her for the past 2-3 months but she would fight her daughter. Her daughter is now POA. She does not cook and goes out twice a day to buy food. She continues to drive and denies getting lost driving. She drives for her neighbors and denies any car accidents. 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 She states she is not doing this anymore. She is independent with dressing and bathing but she kept having a rash on her face, which they think is due to not bathing and washing her hair. She hangs dirty clothes back in the closet. She has been putting on her eyeliner thickly on her lower lid for the past 2 years, which her family reports is a change. Her daughter has also noticed increased irritability, she can get agitated/aggressive quickly, which is not typical for her. No paranoia or hallucinations. No family history of dementia. She denies any history of significant head injuries or alcohol use. She is taking Donepezil 5mg  daily without side effects. She denies any headaches, dizziness, diplopia, dysarthria, dysphagia, neck/back pain, focal numbness/tingling/weakness, bowel/bladder dysfunction. No anosmia, tremors, no falls.    She had a head CT without contrast done 04/2018 which did not show any acute changes. There was mild diffuse atrophy and bilateral hippocampal atrophy noted.  PREVIOUS MEDICATIONS:   CURRENT MEDICATIONS:  Outpatient Encounter Medications as of 10/11/2023  Medication Sig   amLODipine (NORVASC) 10 MG tablet Take 10 mg by mouth daily.   donepezil (ARICEPT) 10 MG tablet TAKE 1 TABLET BY MOUTH AT BEDTIME   QUEtiapine (SEROQUEL) 50 MG tablet Take 1 tablet (50 mg total) by mouth daily at 6 PM. (Patient taking differently: Take 50 mg by mouth at bedtime.)   No  facility-administered encounter medications on file as of 10/11/2023.       10/08/2021    4:00 PM  MMSE - Mini Mental State Exam  Orientation to time 0  Orientation to Place 4  Registration 3  Attention/ Calculation 4  Recall 0  Language- name 2 objects 2  Language- repeat 1  Language- follow 3 step command 2  Language- read & follow direction 1  Write a sentence 0  Copy design 0  Total score 17      08/02/2018   11:00 AM  Montreal Cognitive Assessment   Visuospatial/ Executive (0/5) 5  Naming (0/3) 3  Attention: Read list of digits (0/2) 2  Attention: Read list of letters (0/1) 1  Attention: Serial 7 subtraction starting at 100 (0/3) 3  Language: Repeat phrase (0/2) 2  Language : Fluency (0/1) 0  Abstraction (0/2) 2  Delayed Recall (0/5) 3  Orientation (0/6) 6  Total 27  Objective:     PHYSICAL EXAMINATION:    VITALS:   Vitals:   10/11/23 1438  Resp: 18  Height: 5\' 3"  (1.6 m)    GEN:  The patient appears stated age and is in NAD. HEENT:  Normocephalic, atraumatic.   Neurological examination:  General: NAD, well-groomed, appears stated age. Orientation: The patient is alert.  Not oriented to person, place and date Cranial nerves: There is good facial symmetry.The speech is none fluent but clear. No aphasia or dysarthria. Fund of knowledge is reduced recent and remote memory are impaired. Attention and concentration are reduced.  Unable to name objects and repeat phrases.  Hearing is intact to conversational tone.   Sensation: Sensation is intact to light touch throughout Motor: Strength is at least antigravity x4. DTR's 2/4 in UE/LE     Movement examination: Tone: There is normal tone in the UE/LE Abnormal movements:  no tremor.  No myoclonus.  No asterixis.   Coordination:  There is decremation with RAM's. Abnormal finger to nose  Gait and Station: The patient has no  difficulty arising out of a deep-seated chair without the use of the hands. The  patient's stride length is good.  Gait is cautious and narrow.    Thank you for allowing [place] the opportunity to participate in the care of this nice patient. Please do not hesitate to contact us for any questions or concerns.   Total time spent on today's visit was 24 minutes dedicated to this patient today, preparing to see patient, examining the patient, ordering tests and/or medications and counseling the patient, documenting clinical information in the EHR or other health record, independently interpreting results and communicating results to the patient/family, discussing treatment and goals, answering patient's questions and coordinating care.  Cc:  Georgann Housekeeper, MD  Marlowe Kays 10/11/2023 2:55 PM

## 2023-10-11 NOTE — Patient Instructions (Addendum)
It was a pleasure to see you today at our office.   Recommendations:  Follow up in  6 months Discontinue donepezil.  Agree with memory care.  Dementia success path   Whom to call:  Memory  decline, memory medications: Call our office [phone]   For psychiatric meds, mood meds: Please have your primary care physician manage these medications.      For assessment of decision of mental capacity and competency:  Call Dr. Erick Blinks, geriatric psychiatrist at [phone]  For guidance in geriatric dementia issues please call [facility] [phone]    If you have any severe symptoms of a stroke, or other severe issues such as confusion,severe chills or fever, etc call 911 or go to the ER as you may need to be evaluated further          RECOMMENDATIONS FOR ALL PATIENTS WITH MEMORY PROBLEMS: 1. Continue to exercise (Recommend 30 minutes of walking everyday, or 3 hours every week) 2. Increase social interactions - continue going to [place] and enjoy social gatherings with friends and family 3. Eat healthy, avoid fried foods and eat more fruits and vegetables 4. Maintain adequate blood pressure, blood sugar, and blood cholesterol level. Reducing the risk of stroke and cardiovascular disease also helps promoting better memory. 5. Avoid stressful situations. Live a simple life and avoid aggravations. Organize your time and prepare for the next day in anticipation. 6. Sleep well, avoid any interruptions of sleep and avoid any distractions in the bedroom that may interfere with adequate sleep quality 7. Avoid sugar, avoid sweets as there is a strong link between excessive sugar intake, diabetes, and cognitive impairment We discussed the Mediterranean diet, which has been shown to help patients reduce the risk of progressive memory disorders and reduces cardiovascular risk. This includes eating fish, eat fruits and green leafy vegetables, nuts like almonds and hazelnuts,  walnuts, and also use olive oil. Avoid fast foods and fried foods as much as possible. Avoid sweets and sugar as sugar use has been linked to worsening of memory function.  There is always a concern of gradual progression of memory problems. If this is the case, then we may need to adjust level of care according to patient needs. Support, both to the patient and caregiver, should then be put into place.    The Alzheimer's Association is here all day, every day for people facing Alzheimer's disease through our free 24/7 Helpline: [phone]. The Helpline provides reliable information and support to all those who need assistance, such as individuals living with memory loss, Alzheimer's or other dementia, caregivers, health care professionals and the public.  Our highly trained and knowledgeable staff can help you with: Understanding memory loss, dementia and Alzheimer's  Medications and other treatment options  General information about aging and brain health  Skills to provide quality care and to find the best care from professionals  Legal, financial and living-arrangement decisions Our Helpline also features: Confidential care consultation provided by master's level clinicians who can help with decision-making support, crisis assistance and education on issues families face every day  Help in a caller's preferred language using our translation service that features more than 200 languages and dialects  Referrals to local community programs, services and ongoing support     FALL PRECAUTIONS: Be cautious when walking. Scan the area for obstacles that may increase the risk of trips and falls. When getting up in the mornings, sit up at the edge of the bed for a  few minutes before getting out of bed. Consider elevating the bed at the head end to avoid drop of blood pressure when getting up. Walk always in a well-lit room (use night lights in the walls). Avoid area rugs or power cords from  appliances in the middle of the walkways. Use a walker or a cane if necessary and consider physical therapy for balance exercise. Get your eyesight checked regularly.  FINANCIAL OVERSIGHT: Supervision, especially oversight when making financial decisions or transactions is also recommended.  HOME SAFETY: Consider the safety of the kitchen when operating appliances like stoves, microwave oven, and blender. Consider having supervision and share cooking responsibilities until no longer able to participate in those. Accidents with firearms and other hazards in the house should be identified and addressed as well.   ABILITY TO BE LEFT ALONE: If patient is unable to contact 911 operator, consider using LifeLine, or when the need is there, arrange for someone to stay with patients. Smoking is a fire hazard, consider supervision or cessation. Risk of wandering should be assessed by caregiver and if detected at any point, supervision and safe proof recommendations should be instituted.  MEDICATION SUPERVISION: Inability to self-administer medication needs to be constantly addressed. Implement a mechanism to ensure safe administration of the medications.   DRIVING: Regarding driving, in patients with progressive memory problems, driving will be impaired. We advise to have someone else do the driving if trouble finding directions or if minor accidents are reported. Independent driving assessment is available to determine safety of driving.   If you are interested in the driving assessment, you can contact the following:  [facility] [employer] [facility] in [place] [phone]  [facility] [phone]  [facility] [phone] or [phone]      Mediterranean Diet A Mediterranean diet refers to food and lifestyle choices that are based on the traditions of countries located on the [employer]. This way of eating has been shown to help  prevent certain conditions and improve outcomes for people who have chronic diseases, like kidney disease and heart disease. What are tips for following this plan? Lifestyle  Cook and eat meals together with your family, when possible. Drink enough fluid to keep your urine clear or pale yellow. Be physically active every day. This includes: Aerobic exercise like running or swimming. Leisure activities like gardening, walking, or housework. Get 7-8 hours of sleep each night. If recommended by your health care provider, drink red wine in moderation. This means 1 glass a day for nonpregnant women and 2 glasses a day for men. A glass of wine equals 5 oz (150 mL). Reading food labels  Check the serving size of packaged foods. For foods such as rice and pasta, the serving size refers to the amount of cooked product, not dry. Check the total fat in packaged foods. Avoid foods that have saturated fat or trans fats. Check the ingredients list for added sugars, such as corn syrup. Shopping  At the grocery store, buy most of your food from the areas near the walls of the store. This includes: Fresh fruits and vegetables (produce). Grains, beans, nuts, and seeds. Some of these may be available in unpackaged forms or large amounts (in bulk). Fresh seafood. Poultry and eggs. Low-fat dairy products. Buy whole ingredients instead of prepackaged foods. Buy fresh fruits and vegetables in-season from local farmers markets. Buy frozen fruits and vegetables in resealable bags. If you do not have access to quality fresh seafood, buy  precooked frozen shrimp or canned fish, such as tuna, salmon, or sardines. Buy small amounts of raw or cooked vegetables, salads, or olives from the deli or salad bar at your store. Stock your pantry so you always have certain foods on hand, such as olive oil, canned tuna, canned tomatoes, rice, pasta, and beans. Cooking  Cook foods with extra-virgin olive oil instead of using  butter or other vegetable oils. Have meat as a side dish, and have vegetables or grains as your main dish. This means having meat in small portions or adding small amounts of meat to foods like pasta or stew. Use beans or vegetables instead of meat in common dishes like chili or lasagna. Experiment with different cooking methods. Try roasting or broiling vegetables instead of steaming or sauteing them. Add frozen vegetables to soups, stews, pasta, or rice. Add nuts or seeds for added healthy fat at each meal. You can add these to yogurt, salads, or vegetable dishes. Marinate fish or vegetables using olive oil, lemon juice, garlic, and fresh herbs. Meal planning  Plan to eat 1 vegetarian meal one day each week. Try to work up to 2 vegetarian meals, if possible. Eat seafood 2 or more times a week. Have healthy snacks readily available, such as: Vegetable sticks with hummus. Greek yogurt. Fruit and nut trail mix. Eat balanced meals throughout the week. This includes: Fruit: 2-3 servings a day Vegetables: 4-5 servings a day Low-fat dairy: 2 servings a day Fish, poultry, or lean meat: 1 serving a day Beans and legumes: 2 or more servings a week Nuts and seeds: 1-2 servings a day Whole grains: 6-8 servings a day Extra-virgin olive oil: 3-4 servings a day Limit red meat and sweets to only a few servings a month What are my food choices? Mediterranean diet Recommended Grains: Whole-grain pasta. Brown rice. Bulgar wheat. Polenta. Couscous. Whole-wheat bread. Orpah Cobb. Vegetables: Artichokes. Beets. Broccoli. Cabbage. Carrots. Eggplant. Green beans. Chard. Kale. Spinach. Onions. Leeks. Peas. Squash. Tomatoes. Peppers. Radishes. Fruits: Apples. Apricots. Avocado. Berries. Bananas. Cherries. Dates. Figs. Grapes. Lemons. Melon. Oranges. Peaches. Plums. Pomegranate. Meats and other protein foods: Beans. Almonds. Sunflower seeds. Pine nuts. Peanuts. Cod. Salmon. Scallops. Shrimp. Tuna.  Tilapia. Clams. Oysters. Eggs. Dairy: Low-fat milk. Cheese. Greek yogurt. Beverages: Water. Red wine. Herbal tea. Fats and oils: Extra virgin olive oil. Avocado oil. Grape seed oil. Sweets and desserts: [place] yogurt with honey. Baked apples. Poached pears. Trail mix. Seasoning and other foods: Basil. Cilantro. Coriander. Cumin. Mint. Parsley. Sage. Rosemary. Tarragon. Garlic. Oregano. Thyme. Pepper. Balsalmic vinegar. Tahini. Hummus. Tomato sauce. Olives. Mushrooms. Limit these Grains: Prepackaged pasta or rice dishes. Prepackaged cereal with added sugar. Vegetables: Deep fried potatoes (french fries). Fruits: Fruit canned in syrup. Meats and other protein foods: Beef. Pork. Lamb. Poultry with skin. Hot dogs. Tomasa Blase. Dairy: Ice cream. Sour cream. Whole milk. Beverages: Juice. Sugar-sweetened soft drinks. Beer. Liquor and spirits. Fats and oils: Butter. Canola oil. Vegetable oil. Beef fat (tallow). Lard. Sweets and desserts: Cookies. Cakes. Pies. Candy. Seasoning and other foods: Mayonnaise. Premade sauces and marinades. The items listed may not be a complete list. Talk with your dietitian about what dietary choices are right for you. Summary The Mediterranean diet includes both food and lifestyle choices. Eat a variety of fresh fruits and vegetables, beans, nuts, seeds, and whole grains. Limit the amount of red meat and sweets that you eat. Talk with your health care provider about whether it is safe for you to drink red wine in moderation. This  means 1 glass a day for nonpregnant women and 2 glasses a day for men. A glass of wine equals 5 oz (150 mL). This information is not intended to replace advice given to you by your health care provider. Make sure you discuss any questions you have with your health care provider. Document Released: 07/28/2016 Document Revised: 08/30/2016 Document Reviewed: 07/28/2016 Elsevier Interactive Patient Education  2017 [employer].

## 2023-11-02 ENCOUNTER — Other Ambulatory Visit: Payer: Self-pay | Admitting: Physician Assistant

## 2023-11-04 ENCOUNTER — Emergency Department (HOSPITAL_COMMUNITY): Payer: 59

## 2023-11-04 ENCOUNTER — Encounter (HOSPITAL_COMMUNITY): Payer: Self-pay

## 2023-11-04 ENCOUNTER — Inpatient Hospital Stay (HOSPITAL_COMMUNITY): Payer: 59

## 2023-11-04 ENCOUNTER — Inpatient Hospital Stay (HOSPITAL_COMMUNITY)
Admission: EM | Admit: 2023-11-04 | Discharge: 2023-11-17 | DRG: 082 | Disposition: A | Discharge status: Hospice | Payer: 59 | Attending: Internal Medicine | Admitting: Internal Medicine

## 2023-11-04 ENCOUNTER — Other Ambulatory Visit: Payer: Self-pay

## 2023-11-04 DIAGNOSIS — S199XXA Unspecified injury of neck, initial encounter: Secondary | ICD-10-CM | POA: Diagnosis not present

## 2023-11-04 DIAGNOSIS — S065XAA Traumatic subdural hemorrhage with loss of consciousness status unknown, initial encounter: Principal | ICD-10-CM

## 2023-11-04 DIAGNOSIS — Z7189 Other specified counseling: Secondary | ICD-10-CM | POA: Diagnosis not present

## 2023-11-04 DIAGNOSIS — S066XAA Traumatic subarachnoid hemorrhage with loss of consciousness status unknown, initial encounter: Secondary | ICD-10-CM | POA: Diagnosis present

## 2023-11-04 DIAGNOSIS — T43595A Adverse effect of other antipsychotics and neuroleptics, initial encounter: Secondary | ICD-10-CM | POA: Diagnosis not present

## 2023-11-04 DIAGNOSIS — G928 Other toxic encephalopathy: Secondary | ICD-10-CM | POA: Diagnosis not present

## 2023-11-04 DIAGNOSIS — J449 Chronic obstructive pulmonary disease, unspecified: Secondary | ICD-10-CM | POA: Diagnosis present

## 2023-11-04 DIAGNOSIS — I6523 Occlusion and stenosis of bilateral carotid arteries: Secondary | ICD-10-CM | POA: Diagnosis not present

## 2023-11-04 DIAGNOSIS — G309 Alzheimer's disease, unspecified: Secondary | ICD-10-CM | POA: Diagnosis present

## 2023-11-04 DIAGNOSIS — F02C18 Dementia in other diseases classified elsewhere, severe, with other behavioral disturbance: Secondary | ICD-10-CM | POA: Diagnosis present

## 2023-11-04 DIAGNOSIS — S0990XA Unspecified injury of head, initial encounter: Secondary | ICD-10-CM | POA: Diagnosis not present

## 2023-11-04 DIAGNOSIS — I609 Nontraumatic subarachnoid hemorrhage, unspecified: Secondary | ICD-10-CM | POA: Diagnosis not present

## 2023-11-04 DIAGNOSIS — Z79899 Other long term (current) drug therapy: Secondary | ICD-10-CM | POA: Diagnosis not present

## 2023-11-04 DIAGNOSIS — I10 Essential (primary) hypertension: Secondary | ICD-10-CM | POA: Diagnosis present

## 2023-11-04 DIAGNOSIS — M810 Age-related osteoporosis without current pathological fracture: Secondary | ICD-10-CM | POA: Diagnosis present

## 2023-11-04 DIAGNOSIS — Z87440 Personal history of urinary (tract) infections: Secondary | ICD-10-CM

## 2023-11-04 DIAGNOSIS — Y92009 Unspecified place in unspecified non-institutional (private) residence as the place of occurrence of the external cause: Secondary | ICD-10-CM

## 2023-11-04 DIAGNOSIS — Z743 Need for continuous supervision: Secondary | ICD-10-CM | POA: Diagnosis not present

## 2023-11-04 DIAGNOSIS — S065X0A Traumatic subdural hemorrhage without loss of consciousness, initial encounter: Secondary | ICD-10-CM | POA: Diagnosis not present

## 2023-11-04 DIAGNOSIS — R6889 Other general symptoms and signs: Secondary | ICD-10-CM | POA: Diagnosis not present

## 2023-11-04 DIAGNOSIS — Z880 Allergy status to penicillin: Secondary | ICD-10-CM | POA: Diagnosis not present

## 2023-11-04 DIAGNOSIS — E7800 Pure hypercholesterolemia, unspecified: Secondary | ICD-10-CM | POA: Diagnosis present

## 2023-11-04 DIAGNOSIS — Z86008 Personal history of in-situ neoplasm of other site: Secondary | ICD-10-CM | POA: Diagnosis not present

## 2023-11-04 DIAGNOSIS — G319 Degenerative disease of nervous system, unspecified: Secondary | ICD-10-CM | POA: Diagnosis not present

## 2023-11-04 DIAGNOSIS — W1830XA Fall on same level, unspecified, initial encounter: Secondary | ICD-10-CM | POA: Diagnosis present

## 2023-11-04 DIAGNOSIS — I6201 Nontraumatic acute subdural hemorrhage: Secondary | ICD-10-CM | POA: Diagnosis not present

## 2023-11-04 DIAGNOSIS — F05 Delirium due to known physiological condition: Secondary | ICD-10-CM | POA: Diagnosis present

## 2023-11-04 DIAGNOSIS — F03918 Unspecified dementia, unspecified severity, with other behavioral disturbance: Secondary | ICD-10-CM | POA: Diagnosis present

## 2023-11-04 DIAGNOSIS — T424X5A Adverse effect of benzodiazepines, initial encounter: Secondary | ICD-10-CM | POA: Diagnosis not present

## 2023-11-04 DIAGNOSIS — N390 Urinary tract infection, site not specified: Secondary | ICD-10-CM | POA: Diagnosis not present

## 2023-11-04 DIAGNOSIS — I129 Hypertensive chronic kidney disease with stage 1 through stage 4 chronic kidney disease, or unspecified chronic kidney disease: Secondary | ICD-10-CM | POA: Diagnosis present

## 2023-11-04 DIAGNOSIS — W010XXA Fall on same level from slipping, tripping and stumbling without subsequent striking against object, initial encounter: Secondary | ICD-10-CM | POA: Diagnosis present

## 2023-11-04 DIAGNOSIS — Z87891 Personal history of nicotine dependence: Secondary | ICD-10-CM

## 2023-11-04 DIAGNOSIS — Z515 Encounter for palliative care: Secondary | ICD-10-CM | POA: Diagnosis not present

## 2023-11-04 DIAGNOSIS — Z90710 Acquired absence of both cervix and uterus: Secondary | ICD-10-CM

## 2023-11-04 DIAGNOSIS — W19XXXA Unspecified fall, initial encounter: Secondary | ICD-10-CM | POA: Diagnosis not present

## 2023-11-04 DIAGNOSIS — R0902 Hypoxemia: Secondary | ICD-10-CM | POA: Diagnosis not present

## 2023-11-04 DIAGNOSIS — Z66 Do not resuscitate: Secondary | ICD-10-CM | POA: Diagnosis not present

## 2023-11-04 DIAGNOSIS — I6200 Nontraumatic subdural hemorrhage, unspecified: Secondary | ICD-10-CM | POA: Diagnosis not present

## 2023-11-04 DIAGNOSIS — N1831 Chronic kidney disease, stage 3a: Secondary | ICD-10-CM | POA: Diagnosis present

## 2023-11-04 DIAGNOSIS — G9340 Encephalopathy, unspecified: Secondary | ICD-10-CM | POA: Diagnosis not present

## 2023-11-04 DIAGNOSIS — S0003XA Contusion of scalp, initial encounter: Secondary | ICD-10-CM | POA: Diagnosis not present

## 2023-11-04 DIAGNOSIS — E440 Moderate protein-calorie malnutrition: Secondary | ICD-10-CM | POA: Diagnosis present

## 2023-11-04 DIAGNOSIS — G9341 Metabolic encephalopathy: Secondary | ICD-10-CM | POA: Diagnosis not present

## 2023-11-04 LAB — CBC WITH DIFFERENTIAL/PLATELET
Abs Immature Granulocytes: 0.05 10*3/uL (ref 0.00–0.07)
Basophils Absolute: 0.1 10*3/uL (ref 0.0–0.1)
Basophils Relative: 1 %
Eosinophils Absolute: 0.1 10*3/uL (ref 0.0–0.5)
Eosinophils Relative: 1 %
HCT: 38 % (ref 36.0–46.0)
Hemoglobin: 11.9 g/dL — ABNORMAL LOW (ref 12.0–15.0)
Immature Granulocytes: 1 %
Lymphocytes Relative: 22 %
Lymphs Abs: 2.3 10*3/uL (ref 0.7–4.0)
MCH: 24.4 pg — ABNORMAL LOW (ref 26.0–34.0)
MCHC: 31.3 g/dL (ref 30.0–36.0)
MCV: 78 fL — ABNORMAL LOW (ref 80.0–100.0)
Monocytes Absolute: 0.7 10*3/uL (ref 0.1–1.0)
Monocytes Relative: 7 %
Neutro Abs: 7 10*3/uL (ref 1.7–7.7)
Neutrophils Relative %: 68 %
Platelets: 289 10*3/uL (ref 150–400)
RBC: 4.87 MIL/uL (ref 3.87–5.11)
RDW: 16.7 % — ABNORMAL HIGH (ref 11.5–15.5)
WBC: 10.2 10*3/uL (ref 4.0–10.5)
nRBC: 0 % (ref 0.0–0.2)

## 2023-11-04 LAB — BASIC METABOLIC PANEL
Anion gap: 10 (ref 5–15)
BUN: 12 mg/dL (ref 8–23)
CO2: 23 mmol/L (ref 22–32)
Calcium: 9.3 mg/dL (ref 8.9–10.3)
Chloride: 104 mmol/L (ref 98–111)
Creatinine, Ser: 1.06 mg/dL — ABNORMAL HIGH (ref 0.44–1.00)
GFR, Estimated: 53 mL/min — ABNORMAL LOW (ref >60)
Glucose, Bld: 106 mg/dL — ABNORMAL HIGH (ref 70–99)
Potassium: 3.7 mmol/L (ref 3.5–5.1)
Sodium: 137 mmol/L (ref 135–145)

## 2023-11-04 MED ORDER — LORAZEPAM 2 MG/ML IJ SOLN: 1.0000 mg | INTRAMUSCULAR | Status: DC | PRN

## 2023-11-04 MED ORDER — METOPROLOL TARTRATE 5 MG/5ML IV SOLN
5.0000 mg | INTRAVENOUS | Status: DC | PRN
  Administered 2023-11-07 – 2023-11-16 (×4): 5 mg via INTRAVENOUS
  Filled 2023-11-04 (×4): qty 5

## 2023-11-04 MED ORDER — LORAZEPAM 2 MG/ML IJ SOLN
1.0000 mg | Freq: Once | INTRAMUSCULAR | Status: AC
  Administered 2023-11-04: 1 mg via INTRAMUSCULAR
  Filled 2023-11-04: qty 1

## 2023-11-04 MED ORDER — LEVETIRACETAM IN NACL 500 MG/100ML IV SOLN
500.0000 mg | Freq: Once | INTRAVENOUS | Status: AC
  Administered 2023-11-04: 500 mg via INTRAVENOUS
  Filled 2023-11-04: qty 100

## 2023-11-04 MED ORDER — LEVETIRACETAM IN NACL 500 MG/100ML IV SOLN
500.0000 mg | Freq: Two times a day (BID) | INTRAVENOUS | Status: DC
  Administered 2023-11-05 – 2023-11-08 (×8): 500 mg via INTRAVENOUS
  Filled 2023-11-04 (×8): qty 100

## 2023-11-04 MED ORDER — AMLODIPINE BESYLATE 5 MG PO TABS
10.0000 mg | ORAL_TABLET | Freq: Once | ORAL | Status: AC
  Administered 2023-11-04: 10 mg via ORAL
  Filled 2023-11-04: qty 2

## 2023-11-04 MED ORDER — SODIUM CHLORIDE 0.9 % IV SOLN: INTRAVENOUS | Status: AC

## 2023-11-04 MED ORDER — ACETAMINOPHEN 650 MG RE SUPP: 650.0000 mg | Freq: Four times a day (QID) | RECTAL | Status: DC | PRN

## 2023-11-04 MED ORDER — ACETAMINOPHEN 325 MG PO TABS
650.0000 mg | ORAL_TABLET | Freq: Four times a day (QID) | ORAL | Status: DC | PRN
  Filled 2023-11-04: qty 2

## 2023-11-04 NOTE — ED Notes (Signed)
Sister Elita Quick [phone] would like an update asap

## 2023-11-04 NOTE — ED Notes (Signed)
Sitter at bedside.

## 2023-11-04 NOTE — H&P (Addendum)
PCP:   Georgann Housekeeper, MD   Chief Complaint:  Fall  HPI: This is a 77 year old female with past medical history of recurrent UTI,  Moderate Alzheimer's dementia with behavioral disturbance, CKD stage IIIa, HLD , HTN.  This afternoon when by her daughter, she was walking towards the other steps.  She tripped, fell backwards onto her daughter.  They both tumbled, the patient's head hit the concrete.  She immediately stiffened, per daughter noticed that she had a stroke.  She was brought to the ER.  In the ER CT head shows a left-sided SDH and subarachnoid hemorrhage. NS on-call contacted by EDP, formal consult in AM.   History provided by daughter who is present at bedside  At baseline patient with severe dementia, who sundown's..  She doesn't recognize family members and doesn't converse much anymore.  Her appetite is good.   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   Chronic kidney disease    stage 3   COPD (chronic obstructive pulmonary disease) (HCC)    Daughter denies this dx as of 02/22/23   High cholesterol    Hypertension    Past Surgical History:  Procedure Laterality Date   ABDOMINAL HYSTERECTOMY     BREAST BIOPSY     BUNIONECTOMY Left    cataract Bilateral    MULTIPLE TOOTH EXTRACTIONS     RADIOLOGY WITH ANESTHESIA N/A 04/13/2023   Procedure: MRI WITH ANESTHESIA OF PELVIS WITH AND WITHOUT CONTRAST;  Surgeon: Radiologist, Medication, MD;  Location: [facility] OR;  Service: Radiology;  Laterality: N/A;    Medications: Prior to Admission medications   Medication Sig Start Date End Date Taking? Authorizing Provider  amLODipine (NORVASC) 10 MG tablet Take 10 mg by mouth daily.    [provider]  donepezil (ARICEPT) 10 MG tablet TAKE 1  TABLET BY MOUTH AT BEDTIME 11/02/23   Wertman, Sung Amabile, PA-C  QUEtiapine (SEROQUEL) 50 MG tablet Take 1 tablet (50 mg total) by mouth daily at 6 PM. Patient taking differently: Take 50 mg by mouth at bedtime. 06/16/23   Regalado, Prentiss Bells, MD    Allergies:   Allergies  Allergen Reactions   Penicillins Other (See Comments)    From childhood; reaction not recalled.    Social History:  reports that she has quit smoking. Her smoking use included cigarettes. She has never used smokeless tobacco. She reports that she does not currently use alcohol. She reports that she does not use drugs.  Family History: History reviewed. No pertinent family history.  Physical Exam: Vitals:   11/04/23 1801 11/04/23 1845 11/04/23 1928 11/04/23 2000  BP:  (!) 187/124 (!) 154/72 (!) 158/92  Pulse:  100  95  Resp:  19  (!) 28  Temp:      TempSrc:      SpO2:  98%  97%  Weight: 59.9 kg     Height: 5\' 3"  (1.6 m)       General:  awake, demented, fidgety, no acute distress Eyes: Pink conjunctiva, no scleral icterus ENT: Moist oral mucosa, neck supple, no thyromegaly Lungs: CTA B/L, no wheeze, no crackles, no use of accessory muscles Cardiovascular: RRR, no regurgitation, no gallops, no murmurs. No carotid bruits, no JVD  Abdomen: soft, positive BS, NTND, no organomegaly, not an acute abdomen GU: not examined Neuro: CN II - XII appearsgrossly intact Musculoskeletal: moves all extremities Skin: no rash, no subcutaneous crepitation, no decubitus Psych: demented patient   Labs on Admission:  Recent Labs    11/04/23 1915  NA 137  K 3.7  CL 104  CO2 23  GLUCOSE 106*  BUN 12  CREATININE 1.06*  CALCIUM 9.3    Recent Labs    11/04/23 1915  WBC 10.2  NEUTROABS 7.0  HGB 11.9*  HCT 38.0  MCV 78.0*  PLT 289    Radiological Exams on Admission: CT Head Wo Contrast  Result Date: 11/04/2023 CLINICAL DATA:  Head trauma, intracranial arterial injury suspected; Neck trauma (Age >= 65y). Fall.  EXAM: CT HEAD WITHOUT CONTRAST CT CERVICAL SPINE WITHOUT CONTRAST TECHNIQUE: Multidetector CT imaging of the head and cervical spine was performed following the standard protocol without intravenous contrast. Multiplanar CT image reconstructions of the cervical spine were also generated. RADIATION DOSE REDUCTION: This exam was performed according to the departmental dose-optimization program which includes automated exposure control, adjustment of the mA and/or kV according to patient size and/or use of iterative reconstruction technique. COMPARISON:  Head CT 06/11/2023 FINDINGS: CT HEAD FINDINGS Brain: Small volume acute subarachnoid hemorrhage is present in the left sylvian fissure and adjacent sulci. A small acute subdural hematoma over the left cerebral convexity measures up to 9 mm in thickness without midline shift or other significant mass effect. No parenchymal hemorrhage or acute infarct is evident. There is mild cerebral atrophy. Hypodensities in the cerebral white matter are similar to the prior study and are nonspecific but compatible with mild chronic small vessel ischemic disease. Vascular: Calcified atherosclerosis at the skull base. Skull: No acute fracture or suspicious osseous lesion. Sinuses/Orbits: Paranasal sinuses and mastoid air cells are clear. Bilateral cataract extraction. Other: Large left parieto-occipital scalp hematoma. CT CERVICAL SPINE FINDINGS Alignment: Minimal anterolisthesis of C3 on C4, likely degenerative. Skull base and vertebrae: No acute fracture or destructive lesion. Diffuse osteopenia. Subcentimeter sclerotic focus in the C7 vertebral body, likely a bone island. Soft tissues and spinal canal: No prevertebral fluid or swelling. No visible canal hematoma. Disc levels: Mild-to-moderate multilevel disc degeneration. Advanced facet arthrosis at C2-3 and C3-4. Moderate bilateral neural foraminal stenosis at C5-6 and C6-7. No evidence of high-grade spinal stenosis. Upper chest:  Scarring in the lung apices. Other: Heavy atherosclerotic calcification of the carotid bifurcations. Critical Value/emergent results were called by telephone at the time of interpretation on 11/04/2023 at 6:13 pm to Dr. Bethann Berkshire, who verbally acknowledged these results. IMPRESSION: 1. Acute left-sided subdural hematoma and subarachnoid hemorrhage. No midline shift. 2. No acute cervical spine fracture. Electronically Signed   By: Sebastian Ache M.D.   On: 11/04/2023 18:14   CT Cervical Spine Wo Contrast  Result Date: 11/04/2023 CLINICAL DATA:  Head trauma, intracranial arterial injury suspected; Neck trauma (Age >= 65y). Fall. EXAM: CT HEAD WITHOUT CONTRAST CT CERVICAL SPINE WITHOUT CONTRAST TECHNIQUE: Multidetector CT imaging of the head and cervical spine was performed following the standard protocol without intravenous contrast. Multiplanar CT image reconstructions of the cervical spine were also generated. RADIATION DOSE REDUCTION: This exam was performed according to the departmental dose-optimization program which includes automated exposure control, adjustment of the mA and/or kV according to patient size and/or use of iterative reconstruction technique. COMPARISON:  Head CT 06/11/2023 FINDINGS: CT HEAD FINDINGS Brain: Small volume acute subarachnoid hemorrhage is present in the left sylvian  fissure and adjacent sulci. A small acute subdural hematoma over the left cerebral convexity measures up to 9 mm in thickness without midline shift or other significant mass effect. No parenchymal hemorrhage or acute infarct is evident. There is mild cerebral atrophy. Hypodensities in the cerebral white matter are similar to the prior study and are nonspecific but compatible with mild chronic small vessel ischemic disease. Vascular: Calcified atherosclerosis at the skull base. Skull: No acute fracture or suspicious osseous lesion. Sinuses/Orbits: Paranasal sinuses and mastoid air cells are clear. Bilateral cataract  extraction. Other: Large left parieto-occipital scalp hematoma. CT CERVICAL SPINE FINDINGS Alignment: Minimal anterolisthesis of C3 on C4, likely degenerative. Skull base and vertebrae: No acute fracture or destructive lesion. Diffuse osteopenia. Subcentimeter sclerotic focus in the C7 vertebral body, likely a bone island. Soft tissues and spinal canal: No prevertebral fluid or swelling. No visible canal hematoma. Disc levels: Mild-to-moderate multilevel disc degeneration. Advanced facet arthrosis at C2-3 and C3-4. Moderate bilateral neural foraminal stenosis at C5-6 and C6-7. No evidence of high-grade spinal stenosis. Upper chest: Scarring in the lung apices. Other: Heavy atherosclerotic calcification of the carotid bifurcations. Critical Value/emergent results were called by telephone at the time of interpretation on 11/04/2023 at 6:13 pm to Dr. Bethann Berkshire, who verbally acknowledged these results. IMPRESSION: 1. Acute left-sided subdural hematoma and subarachnoid hemorrhage. No midline shift. 2. No acute cervical spine fracture. Electronically Signed   By: Sebastian Ache M.D.   On: 11/04/2023 18:14    Assessment/Plan Present on Admission:  Fall, initial encounter  Acute left-sided subdural hematoma and subarachnoid hemorrhage -No midline shift -Serial neurochecks every 2 hours.  Repeat CT head without contrast in 6 hours, ordered for midnight. -Neurosurgeon on-call contacted by EDP.  Recommendation admission start Keppra 500 twice daily.  Keep systolic blood pressure less than 150.  Formal consult in AM. -C-spine imaging normal -PT/OT consulted when cleared   Dementia with behavioral disturbance (HCC) -Mentation at baseline per daughter.  Patient very agitated.  Mitts ordered. -Telesitter ordered -Ativan once as needed agitation   HTN (hypertension) -Home doses Norvasc ordered, first dose now -Keep systolic blood pressure less than 150.    Stage 3a chronic kidney disease (CKD)  (HCC)  Loyed Wilmes 11/04/2023, 8:38 PM

## 2023-11-04 NOTE — ED Triage Notes (Signed)
Patient bib EMS from home after a fall. Patient fell backwards off of steps and landed on family members and they fell backwards also. Patient hit her head on gravel. Hematoma on the back of her head originally baseball sized and now grapefruit sized. Original BP 170/88 and now 202/96 for EMS.   No thinners, no LOC, patient has a history of dementia

## 2023-11-04 NOTE — ED Provider Notes (Signed)
[facility] EMERGENCY DEPARTMENT AT [facility] Provider Note   CSN: [id number] Arrival date & time: 11/04/23  1716     History {Add pertinent medical, surgical, social history, OB history to HPI:1} Chief Complaint  Patient presents with   Katie Jackson         Jenee Raz is a 77 y.o. female.  Patient has a history of dementia.  She fell and hit her head.  No loss of consciousness  The history is provided by a relative. No language interpreter was used.  Fall This is a new problem. The current episode started 6 to 12 hours ago. The problem occurs rarely. The problem has been resolved. Pertinent negatives include no chest pain. Nothing aggravates the symptoms. Nothing relieves the symptoms. She has tried nothing for the symptoms.       Home Medications Prior to Admission medications   Medication Sig Start Date End Date Taking? Authorizing Provider  amLODipine (NORVASC) 10 MG tablet Take 10 mg by mouth daily.    [provider]  donepezil (ARICEPT) 10 MG tablet TAKE 1 TABLET BY MOUTH AT BEDTIME 11/02/23   Wertman, Sung Amabile, PA-C  QUEtiapine (SEROQUEL) 50 MG tablet Take 1 tablet (50 mg total) by mouth daily at 6 PM. Patient taking differently: Take 50 mg by mouth at bedtime. 06/16/23   Regalado, Jon Billings A, MD      Allergies    Penicillins    Review of Systems   Review of Systems  Unable to perform ROS: Dementia  Cardiovascular:  Negative for chest pain.    Physical Exam Updated Vital Signs BP (!) 154/72   Pulse 100   Temp 98.1 F (36.7 C) (Oral)   Resp 19   Ht 5\' 3"  (1.6 m)   Wt 59.9 kg   SpO2 98%   BMI 23.38 kg/m  Physical Exam Vitals and nursing note reviewed.  Constitutional:      Appearance: She is well-developed.  HENT:     Head: Normocephalic.     Comments: Bruising and swelling tobacco pad    Nose: Nose normal.  Eyes:     General: No scleral icterus.    Conjunctiva/sclera: Conjunctivae normal.  Neck:     Thyroid: No  thyromegaly.  Cardiovascular:     Rate and Rhythm: Normal rate and regular rhythm.     Heart sounds: No murmur heard.    No friction rub. No gallop.  Pulmonary:     Breath sounds: No stridor. No wheezing or rales.  Chest:     Chest wall: No tenderness.  Abdominal:     General: There is no distension.     Tenderness: There is no abdominal tenderness. There is no rebound.  Musculoskeletal:        General: Normal range of motion.     Cervical back: Neck supple.  Lymphadenopathy:     Cervical: No cervical adenopathy.  Skin:    Findings: No erythema or rash.  Neurological:     Mental Status: She is alert.     Motor: No abnormal muscle tone.     Coordination: Coordination normal.     Comments: Alert and not oriented to anything.  This is her normal     ED Results / Procedures / Treatments   Labs (all labs ordered are listed, but only abnormal results are displayed) Labs Reviewed  CBC WITH DIFFERENTIAL/PLATELET - Abnormal; Notable for the following components:      Result Value   Hemoglobin 11.9 (*)  MCV 78.0 (*)    MCH 24.4 (*)    RDW 16.7 (*)    All other components within normal limits  BASIC METABOLIC PANEL - Abnormal; Notable for the following components:   Glucose, Bld 106 (*)    Creatinine, Ser 1.06 (*)    GFR, Estimated 53 (*)    All other components within normal limits    EKG None  Radiology CT Head Wo Contrast  Result Date: 11/04/2023 CLINICAL DATA:  Head trauma, intracranial arterial injury suspected; Neck trauma (Age >= 65y). Fall. EXAM: CT HEAD WITHOUT CONTRAST CT CERVICAL SPINE WITHOUT CONTRAST TECHNIQUE: Multidetector CT imaging of the head and cervical spine was performed following the standard protocol without intravenous contrast. Multiplanar CT image reconstructions of the cervical spine were also generated. RADIATION DOSE REDUCTION: This exam was performed according to the departmental dose-optimization program which includes automated exposure  control, adjustment of the mA and/or kV according to patient size and/or use of iterative reconstruction technique. COMPARISON:  Head CT 06/11/2023 FINDINGS: CT HEAD FINDINGS Brain: Small volume acute subarachnoid hemorrhage is present in the left sylvian fissure and adjacent sulci. A small acute subdural hematoma over the left cerebral convexity measures up to 9 mm in thickness without midline shift or other significant mass effect. No parenchymal hemorrhage or acute infarct is evident. There is mild cerebral atrophy. Hypodensities in the cerebral white matter are similar to the prior study and are nonspecific but compatible with mild chronic small vessel ischemic disease. Vascular: Calcified atherosclerosis at the skull base. Skull: No acute fracture or suspicious osseous lesion. Sinuses/Orbits: Paranasal sinuses and mastoid air cells are clear. Bilateral cataract extraction. Other: Large left parieto-occipital scalp hematoma. CT CERVICAL SPINE FINDINGS Alignment: Minimal anterolisthesis of C3 on C4, likely degenerative. Skull base and vertebrae: No acute fracture or destructive lesion. Diffuse osteopenia. Subcentimeter sclerotic focus in the C7 vertebral body, likely a bone island. Soft tissues and spinal canal: No prevertebral fluid or swelling. No visible canal hematoma. Disc levels: Mild-to-moderate multilevel disc degeneration. Advanced facet arthrosis at C2-3 and C3-4. Moderate bilateral neural foraminal stenosis at C5-6 and C6-7. No evidence of high-grade spinal stenosis. Upper chest: Scarring in the lung apices. Other: Heavy atherosclerotic calcification of the carotid bifurcations. Critical Value/emergent results were called by telephone at the time of interpretation on 11/04/2023 at 6:13 pm to Dr. Bethann Berkshire, who verbally acknowledged these results. IMPRESSION: 1. Acute left-sided subdural hematoma and subarachnoid hemorrhage. No midline shift. 2. No acute cervical spine fracture. Electronically  Signed   By: Sebastian Ache M.D.   On: 11/04/2023 18:14   CT Cervical Spine Wo Contrast  Result Date: 11/04/2023 CLINICAL DATA:  Head trauma, intracranial arterial injury suspected; Neck trauma (Age >= 65y). Fall. EXAM: CT HEAD WITHOUT CONTRAST CT CERVICAL SPINE WITHOUT CONTRAST TECHNIQUE: Multidetector CT imaging of the head and cervical spine was performed following the standard protocol without intravenous contrast. Multiplanar CT image reconstructions of the cervical spine were also generated. RADIATION DOSE REDUCTION: This exam was performed according to the departmental dose-optimization program which includes automated exposure control, adjustment of the mA and/or kV according to patient size and/or use of iterative reconstruction technique. COMPARISON:  Head CT 06/11/2023 FINDINGS: CT HEAD FINDINGS Brain: Small volume acute subarachnoid hemorrhage is present in the left sylvian fissure and adjacent sulci. A small acute subdural hematoma over the left cerebral convexity measures up to 9 mm in thickness without midline shift or other significant mass effect. No parenchymal hemorrhage or acute infarct is evident.  There is mild cerebral atrophy. Hypodensities in the cerebral white matter are similar to the prior study and are nonspecific but compatible with mild chronic small vessel ischemic disease. Vascular: Calcified atherosclerosis at the skull base. Skull: No acute fracture or suspicious osseous lesion. Sinuses/Orbits: Paranasal sinuses and mastoid air cells are clear. Bilateral cataract extraction. Other: Large left parieto-occipital scalp hematoma. CT CERVICAL SPINE FINDINGS Alignment: Minimal anterolisthesis of C3 on C4, likely degenerative. Skull base and vertebrae: No acute fracture or destructive lesion. Diffuse osteopenia. Subcentimeter sclerotic focus in the C7 vertebral body, likely a bone island. Soft tissues and spinal canal: No prevertebral fluid or swelling. No visible canal hematoma. Disc  levels: Mild-to-moderate multilevel disc degeneration. Advanced facet arthrosis at C2-3 and C3-4. Moderate bilateral neural foraminal stenosis at C5-6 and C6-7. No evidence of high-grade spinal stenosis. Upper chest: Scarring in the lung apices. Other: Heavy atherosclerotic calcification of the carotid bifurcations. Critical Value/emergent results were called by telephone at the time of interpretation on 11/04/2023 at 6:13 pm to Dr. Bethann Berkshire, who verbally acknowledged these results. IMPRESSION: 1. Acute left-sided subdural hematoma and subarachnoid hemorrhage. No midline shift. 2. No acute cervical spine fracture. Electronically Signed   By: Sebastian Ache M.D.   On: 11/04/2023 18:14    Procedures Procedures  {Document cardiac monitor, telemetry assessment procedure when appropriate:1}  Medications Ordered in ED Medications  LORazepam (ATIVAN) injection 1 mg (1 mg Intramuscular Given 11/04/23 1743)  levETIRAcetam (KEPPRA) IVPB 500 mg/100 mL premix (0 mg Intravenous Stopped 11/04/23 1954)  amLODipine (NORVASC) tablet 10 mg (10 mg Oral Given 11/04/23 1928)    ED Course/ Medical Decision Making/ A&P   CRITICAL CARE Performed by: Bethann Berkshire Total critical care time: 45 minutes Critical care time was exclusive of separately billable procedures and treating other patients. Critical care was necessary to treat or prevent imminent or life-threatening deterioration. Critical care was time spent personally by me on the following activities: development of treatment plan with patient and/or surrogate as well as nursing, discussions with consultants, evaluation of patient's response to treatment, examination of patient, obtaining history from patient or surrogate, ordering and performing treatments and interventions, ordering and review of laboratory studies, ordering and review of radiographic studies, pulse oximetry and re-evaluation of patient's condition.       {Patient has a small subdural  and small subarachnoid.  I spoke with neurosurgery and they stated the patient needs to be admitted to medicine most likely progressive floor.  The patient needs to be placed on Keppra 500 mg twice a day and try to keep her systolic blood pressure under 784.  Patient needs repeat scan in the morning Click here for ABCD2, HEART and other calculatorsREFRESH Note before signing :1}                              Medical Decision Making Amount and/or Complexity of Data Reviewed Labs: ordered. Radiology: ordered.  Risk Prescription drug management.   Fall with subdural hematoma  {Document critical care time when appropriate:1} {Document review of labs and clinical decision tools ie heart score, Chads2Vasc2 etc:1}  {Document your independent review of radiology images, and any outside records:1} {Document your discussion with family members, caretakers, and with consultants:1} {Document social determinants of health affecting pt's care:1} {Document your decision making why or why not admission, treatments were needed:1} Final Clinical Impression(s) / ED Diagnoses Final diagnoses:  None    Rx / DC Orders ED  Discharge Orders     None

## 2023-11-04 NOTE — ED Notes (Signed)
Attempted for IV.

## 2023-11-04 NOTE — ED Notes (Signed)
NIH attempted, she has baseline dementia. Results may vary.

## 2023-11-05 ENCOUNTER — Inpatient Hospital Stay (HOSPITAL_COMMUNITY): Payer: 59

## 2023-11-05 DIAGNOSIS — S065XAA Traumatic subdural hemorrhage with loss of consciousness status unknown, initial encounter: Secondary | ICD-10-CM | POA: Diagnosis not present

## 2023-11-05 LAB — BASIC METABOLIC PANEL
Anion gap: 7 (ref 5–15)
BUN: 10 mg/dL (ref 8–23)
CO2: 23 mmol/L (ref 22–32)
Calcium: 9 mg/dL (ref 8.9–10.3)
Chloride: 107 mmol/L (ref 98–111)
Creatinine, Ser: 1 mg/dL (ref 0.44–1.00)
GFR, Estimated: 57 mL/min — ABNORMAL LOW (ref >60)
Glucose, Bld: 104 mg/dL — ABNORMAL HIGH (ref 70–99)
Potassium: 3.8 mmol/L (ref 3.5–5.1)
Sodium: 137 mmol/L (ref 135–145)

## 2023-11-05 LAB — PROTIME-INR
INR: 1.1 (ref 0.8–1.2)
Prothrombin Time: 14.4 s (ref 11.4–15.2)

## 2023-11-05 LAB — CBC
HCT: 35.8 % — ABNORMAL LOW (ref 36.0–46.0)
Hemoglobin: 11.1 g/dL — ABNORMAL LOW (ref 12.0–15.0)
MCH: 24.3 pg — ABNORMAL LOW (ref 26.0–34.0)
MCHC: 31 g/dL (ref 30.0–36.0)
MCV: 78.3 fL — ABNORMAL LOW (ref 80.0–100.0)
Platelets: 264 10*3/uL (ref 150–400)
RBC: 4.57 MIL/uL (ref 3.87–5.11)
RDW: 16.6 % — ABNORMAL HIGH (ref 11.5–15.5)
WBC: 10.8 10*3/uL — ABNORMAL HIGH (ref 4.0–10.5)
nRBC: 0 % (ref 0.0–0.2)

## 2023-11-05 LAB — MAGNESIUM: Magnesium: 2.1 mg/dL (ref 1.7–2.4)

## 2023-11-05 MED ORDER — MELATONIN 3 MG PO TABS
3.0000 mg | ORAL_TABLET | Freq: Every day | ORAL | Status: DC
  Administered 2023-11-05: 3 mg via ORAL
  Filled 2023-11-05: qty 1

## 2023-11-05 MED ORDER — QUETIAPINE FUMARATE 50 MG PO TABS
50.0000 mg | ORAL_TABLET | Freq: Every day | ORAL | Status: DC
  Administered 2023-11-05 – 2023-11-06 (×2): 50 mg via ORAL
  Filled 2023-11-05 (×2): qty 1

## 2023-11-05 MED ORDER — LORAZEPAM 2 MG/ML IJ SOLN
1.0000 mg | INTRAMUSCULAR | Status: DC | PRN
  Administered 2023-11-05: 1 mg via INTRAVENOUS
  Filled 2023-11-05: qty 1

## 2023-11-05 NOTE — Progress Notes (Signed)
PROGRESS NOTE  Katie Jackson  DOB: 11/23/1946  PCP: Georgann Housekeeper, MD XBJ:[id number]  DOA: 11/04/2023  LOS: 1 day  Hospital Day: 2  Brief narrative: Katie Jackson is a 77 y.o. female with PMH significant for Alzheimer's dementia with behavioral symptoms, HTN, HLD, CKD, COPD. Is a 14, patient was brought to the ED by EMS from home after a fall.  Patient fell backwards off of the steps and landed on her daughter who fell backwards with her.  Patient hit her head on the concrete.  She immediately stiffened.  EMS was called.  EMS noted a blood pressure of 170/88.  In the ED, patient was afebrile, heart rate in 90s, blood pressure 175/63 CT head showed an acute left-sided subdural hematoma of 9 mm size and subarachnoid hemorrhage. No midline shift. No acute cervical spine fracture. EDP discussed with neurosurgery on-call. Admitted to [facility]  Repeat CT head last night showed subdural hematoma enlarging from 9 mm to 12 mm. Repeat CT head ordered for this morning again.  Subjective: Patient was seen and examined this morning. Elderly Caucasian female.  Somnolent.  Daughter at bedside. Patient opens eyes at times and does not have any conversation.  Not too much different than her baseline per daughter. Hemodynamically stable.  Blood pressure this morning in 90s and low 100s.  Assessment and plan: Acute left-sided subdural hematoma and subarachnoid hemorrhage Presented after a fall  Initial CT head subdural hematoma and subarachnoid hemorrhage. Subsequent CT showed enlargement.  CT repeated again later this morning showed stable size. Neurosurgery Dr. Maisie Fus involved.  Noted plan to follow-up tomorrow morning As recommendation, patient has been started on Keppra 500 mg twice daily Also recommended to keep systolic blood pressure less than 150.  To be seen by PT when cleared by neurosurgery   Dementia with behavioral disturbance Mentation at baseline per daughter.   Patient  was very agitated last night.  Mitts in place.  Continue Telesitter  PTA meds- Seroquel 50 mg nightly. Continue the same Also ordered for melatonin tonight.   Hypertension PTA meds- Norvasc 10 mg daily Continue the same Keep systolic blood pressure less than 150.    CKD 3a Recent Labs    04/21/23 2130 04/22/23 0626 04/23/23 0242 04/24/23 0435 06/11/23 1901 06/12/23 0701 06/13/23 0459 06/15/23 0634 11/04/23 1915 11/05/23 0216  BUN 27*  --  19 17 19 14 17 21 12 10   CREATININE 1.59* 1.46* 1.19* 1.11* 1.38* 1.19* 1.24* 1.11* 1.06* 1.00   Mobility: Compromised mobility at baseline.  PT eval ordered.  Family is expecting rehab placement  Goals of care   Code Status: Full Code     DVT prophylaxis:  SCDs Start: 11/04/23 2109   Antimicrobials: None Fluid: NS at 50 mL/h Consultants: Neurosurgery Family Communication: Daughter at bedside  Status: Inpatient Level of care:  Progressive   Patient is from: Home Needs to continue in-hospital care: Repeat scan tomorrow Anticipated d/c to: Family expecting rehab placement    Diet:  Diet Order             Diet Heart Room service appropriate? Yes; Fluid consistency: Thin  Diet effective now                   Scheduled Meds:  melatonin  3 mg Oral QHS   QUEtiapine  50 mg Oral QHS    PRN meds: acetaminophen **OR** acetaminophen, metoprolol tartrate   Infusions:   sodium chloride 50 mL/hr at 11/04/23 2142  levETIRAcetam Stopped (11/05/23 6063)    Antimicrobials: Anti-infectives (From admission, onward)    None       Objective: Vitals:   11/05/23 0906 11/05/23 1000  BP:  132/60  Pulse:  88  Resp:  (!) 23  Temp: 98.1 F (36.7 C)   SpO2:  97%   No intake or output data in the 24 hours ending 11/05/23 1149 Filed Weights   11/04/23 1801  Weight: 59.9 kg   Weight change:  Body mass index is 23.38 kg/m.   Physical Exam: General exam: Pleasant elderly Caucasian female. Skin: No rashes, lesions  or ulcers. HEENT: Atraumatic, normocephalic, no obvious bleeding Lungs: Clear to auscultation laterally CVS: Regular rate and rhythm, normal GI/Abd soft, nontender, nondistended, bowel sound CNS: Somnolent, opens eyes spontaneously. Does not engage in conversation. Close to baseline Extremities: No pedal edema, no calf tenderness  Data Review: I have personally reviewed the laboratory data and studies available.  F/u labs ordered Unresulted Labs (From admission, onward)     Start     Ordered   11/04/23 2114  Urinalysis, w/ Reflex to Culture (Infection Suspected) -Urine, Clean Catch  (Urine Labs)  Once,   R       Question:  Specimen Source  Answer:  Urine, Clean Catch   11/04/23 2113           Total time spent in review of labs and imaging, patient evaluation, formulation of plan, documentation and communication with family: 45 minutes  Signed, Lorin Glass, MD [facility] 11/05/2023

## 2023-11-05 NOTE — ED Notes (Signed)
ED TO INPATIENT HANDOFF REPORT  ED Nurse Name and Phone #: Donny Pique, RN [phone]  S Name/Age/Gender Katie Jackson 77 y.o. female Room/Bed: 003C/003C  Code Status   Code Status: Full Code  Home/SNF/Other Skilled nursing facility Patient oriented to: self Is this baseline? Yes   Triage Complete: Triage complete  Chief Complaint SDH (subdural hematoma) (HCC) [S06.5XAA]  Triage Note Patient bib EMS from home after a fall. Patient fell backwards off of steps and landed on family members and they fell backwards also. Patient hit her head on gravel. Hematoma on the back of her head originally baseball sized and now grapefruit sized. Original BP 170/88 and now 202/96 for EMS.   No thinners, no LOC, patient has a history of dementia    Allergies Allergies  Allergen Reactions   Penicillins Other (See Comments)    From childhood; reaction not recalled.    Level of Care/Admitting Diagnosis ED Disposition     ED Disposition  Admit   Condition  --   Comment  Hospital Area: [facility] [[id number]]  Level of Care: Progressive [102]  Admit to Progressive based on following criteria: NEUROLOGICAL AND NEUROSURGICAL complex patients with significant risk of instability, who do not meet ICU criteria, yet require close observation or frequent assessment (< / = every 2 - 4 hours) with medical / nursing intervention.  May admit patient to [facility] or [facility] if equivalent level of care is available:: No  Covid Evaluation: Asymptomatic - no recent exposure (last 10 days) testing not required  Diagnosis: SDH (subdural hematoma) (HCC) [413244]  Admitting Physician: Gery Pray [[id number]]  Attending Physician: Gery Pray [[id number]]  Certification:: I certify this patient will need inpatient services for at least 2 midnights  Expected Medical Readiness: 11/06/2023          B Medical/Surgery History Past Medical History:  Diagnosis Date   Age related osteoporosis     Alzheimer disease (HCC) 08/02/2018   late onset - moderate   Cancer (HCC) 08/11/2022   x 2 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 2   Chronic kidney disease    stage 3   COPD (chronic obstructive pulmonary disease) (HCC)    Daughter denies this dx as of 02/22/23   High cholesterol    Hypertension    Past Surgical History:  Procedure Laterality Date   ABDOMINAL HYSTERECTOMY     BREAST BIOPSY     BUNIONECTOMY Left    cataract Bilateral    MULTIPLE TOOTH EXTRACTIONS     RADIOLOGY WITH ANESTHESIA N/A 04/13/2023   Procedure: MRI WITH ANESTHESIA OF PELVIS WITH AND WITHOUT CONTRAST;  Surgeon: Radiologist, Medication, MD;  Location: [facility] OR;  Service: Radiology;  Laterality: N/A;     A IV Location/Drains/Wounds Patient Lines/Drains/Airways Status     Active Line/Drains/Airways     Name Placement date Placement time Site Days   Peripheral IV 11/04/23 20 G Right Antecubital 11/04/23  1905  Antecubital  1            Intake/Output Last 24 hours No intake or output data in the 24 hours ending 11/05/23 1302  Labs/Imaging Results for orders placed or performed during the hospital encounter of 11/04/23 (from the past 48 hour(s))  CBC with Differential     Status: Abnormal   Collection Time: 11/04/23  7:15 PM  Result Value Ref Range   WBC 10.2 4.0 - 10.5 K/uL   RBC 4.87 3.87 - 5.11 MIL/uL  Hemoglobin 11.9 (L) 12.0 - 15.0 g/dL   HCT 25.9 56.3 - 87.5 %   MCV 78.0 (L) 80.0 - 100.0 fL   MCH 24.4 (L) 26.0 - 34.0 pg   MCHC 31.3 30.0 - 36.0 g/dL   RDW 64.3 (H) 32.9 - 51.8 %   Platelets 289 150 - 400 K/uL   nRBC 0.0 0.0 - 0.2 %   Neutrophils Relative % 68 %   Neutro Abs 7.0 1.7 - 7.7 K/uL   Lymphocytes Relative 22 %   Lymphs Abs 2.3 0.7 - 4.0 K/uL   Monocytes Relative 7 %   Monocytes Absolute 0.7 0.1 - 1.0 K/uL   Eosinophils Relative 1 %   Eosinophils Absolute 0.1 0.0 - 0.5 K/uL   Basophils Relative 1 %   Basophils Absolute 0.1 0.0 - 0.1 K/uL   Immature  Granulocytes 1 %   Abs Immature Granulocytes 0.05 0.00 - 0.07 K/uL    Comment: Performed at [facility], [address] [place], [place] [address]  Basic metabolic panel     Status: Abnormal   Collection Time: 11/04/23  7:15 PM  Result Value Ref Range   Sodium 137 135 - 145 mmol/L   Potassium 3.7 3.5 - 5.1 mmol/L   Chloride 104 98 - 111 mmol/L   CO2 23 22 - 32 mmol/L   Glucose, Bld 106 (H) 70 - 99 mg/dL    Comment: Glucose reference range applies only to samples taken after fasting for at least 8 hours.   BUN 12 8 - 23 mg/dL   Creatinine, Ser 0.63 (H) 0.44 - 1.00 mg/dL   Calcium 9.3 8.9 - 01.6 mg/dL   GFR, Estimated 53 (L) >60 mL/min    Comment: (NOTE) Calculated using the CKD-EPI Creatinine Equation (2021)    Anion gap 10 5 - 15    Comment: Performed at [facility], [address] [place], [place] [address]  Basic metabolic panel     Status: Abnormal   Collection Time: 11/05/23  2:16 AM  Result Value Ref Range   Sodium 137 135 - 145 mmol/L   Potassium 3.8 3.5 - 5.1 mmol/L   Chloride 107 98 - 111 mmol/L   CO2 23 22 - 32 mmol/L   Glucose, Bld 104 (H) 70 - 99 mg/dL    Comment: Glucose reference range applies only to samples taken after fasting for at least 8 hours.   BUN 10 8 - 23 mg/dL   Creatinine, Ser 2.35 0.44 - 1.00 mg/dL   Calcium 9.0 8.9 - 57.3 mg/dL   GFR, Estimated 57 (L) >60 mL/min    Comment: (NOTE) Calculated using the CKD-EPI Creatinine Equation (2021)    Anion gap 7 5 - 15    Comment: Performed at [facility], [address] [place], [place] [address]  Magnesium     Status: None   Collection Time: 11/05/23  2:16 AM  Result Value Ref Range   Magnesium 2.1 1.7 - 2.4 mg/dL    Comment: Performed at [facility], [address] [place], [place] [address]  CBC     Status: Abnormal   Collection Time: 11/05/23  2:16 AM  Result Value Ref Range   WBC 10.8 (H) 4.0 - 10.5 K/uL   RBC 4.57 3.87 - 5.11 MIL/uL   Hemoglobin 11.1 (L) 12.0 -  15.0 g/dL   HCT 23.7 (L) 62.8 - 31.5 %   MCV 78.3 (L) 80.0 - 100.0 fL  MCH 24.3 (L) 26.0 - 34.0 pg   MCHC 31.0 30.0 - 36.0 g/dL   RDW 66.4 (H) 40.3 - 47.4 %   Platelets 264 150 - 400 K/uL   nRBC 0.0 0.0 - 0.2 %    Comment: Performed at [facility], [address] [place], [place] [address]  Protime-INR     Status: None   Collection Time: 11/05/23  2:16 AM  Result Value Ref Range   Prothrombin Time 14.4 11.4 - 15.2 seconds   INR 1.1 0.8 - 1.2    Comment: (NOTE) INR goal varies based on device and disease states. Performed at [facility], [address] [place], [place] [address]    CT HEAD WO CONTRAST ( )  Result Date: 11/05/2023 CLINICAL DATA:  Follow-up subdural hematoma EXAM: CT HEAD WITHOUT CONTRAST TECHNIQUE: Contiguous axial images were obtained from the base of the skull through the vertex without intravenous contrast. RADIATION DOSE REDUCTION: This exam was performed according to the departmental dose-optimization program which includes automated exposure control, adjustment of the mA and/or kV according to patient size and/or use of iterative reconstruction technique. COMPARISON:  Earlier the same day at 12:06 FINDINGS: Brain: Subdural hematoma along the left cerebral convexity shows no interval enlargement, maximal thickness on coronal reformats measures 12 mm. Local subarachnoid hemorrhage along the sylvian fissure. No significant mass effect given underlying brain atrophy. No evidence of infarct, hydrocephalus, or mass lesion. Brain atrophy Vascular: No hyperdense vessel or unexpected calcification. Skull: Normal. Negative for fracture or focal lesion. Sinuses/Orbits: No acute finding. IMPRESSION: Unchanged subdural hematoma on the left with local subarachnoid hemorrhage. Maximal subdural thickness is 12 mm but mass effect is mild in the setting of brain atrophy. No interval progression. Electronically Signed   By: Tiburcio Pea M.D.   On: 11/05/2023 06:23    CT HEAD WO CONTRAST ( )  Result Date: 11/05/2023 CLINICAL DATA:  Subdural hematoma EXAM: CT HEAD WITHOUT CONTRAST TECHNIQUE: Contiguous axial images were obtained from the base of the skull through the vertex without intravenous contrast. RADIATION DOSE REDUCTION: This exam was performed according to the departmental dose-optimization program which includes automated exposure control, adjustment of the mA and/or kV according to patient size and/or use of iterative reconstruction technique. COMPARISON:  11/04/2023 FINDINGS: Brain: Enlarging acute left subdural hematoma, measuring up to 12 mm in thickness compared to 9 mm previously. Subarachnoid blood again noted in the sylvian fissure and adjacent sulci, stable. No midline shift. No acute infarction or hydrocephalus. Vascular: No hyperdense vessel or unexpected calcification. Skull: No acute calvarial abnormality. Sinuses/Orbits: No acute findings Other: Large left parietooccipital scalp hematoma. This is unchanged. IMPRESSION: Stable left subarachnoid hemorrhage. Enlarging left subdural hematoma, now 12 mm in thickness. No midline shift. Electronically Signed   By: Charlett Nose M.D.   On: 11/05/2023 00:18   CT Head Wo Contrast  Result Date: 11/04/2023 CLINICAL DATA:  Head trauma, intracranial arterial injury suspected; Neck trauma (Age >= 65y). Fall. EXAM: CT HEAD WITHOUT CONTRAST CT CERVICAL SPINE WITHOUT CONTRAST TECHNIQUE: Multidetector CT imaging of the head and cervical spine was performed following the standard protocol without intravenous contrast. Multiplanar CT image reconstructions of the cervical spine were also generated. RADIATION DOSE REDUCTION: This exam was performed according to the departmental dose-optimization program which includes automated exposure control, adjustment of the mA and/or kV according to patient size and/or use of iterative reconstruction technique. COMPARISON:  Head CT 06/11/2023 FINDINGS: CT HEAD FINDINGS Brain:  Small volume acute subarachnoid  hemorrhage is present in the left sylvian fissure and adjacent sulci. A small acute subdural hematoma over the left cerebral convexity measures up to 9 mm in thickness without midline shift or other significant mass effect. No parenchymal hemorrhage or acute infarct is evident. There is mild cerebral atrophy. Hypodensities in the cerebral white matter are similar to the prior study and are nonspecific but compatible with mild chronic small vessel ischemic disease. Vascular: Calcified atherosclerosis at the skull base. Skull: No acute fracture or suspicious osseous lesion. Sinuses/Orbits: Paranasal sinuses and mastoid air cells are clear. Bilateral cataract extraction. Other: Large left parieto-occipital scalp hematoma. CT CERVICAL SPINE FINDINGS Alignment: Minimal anterolisthesis of C3 on C4, likely degenerative. Skull base and vertebrae: No acute fracture or destructive lesion. Diffuse osteopenia. Subcentimeter sclerotic focus in the C7 vertebral body, likely a bone island. Soft tissues and spinal canal: No prevertebral fluid or swelling. No visible canal hematoma. Disc levels: Mild-to-moderate multilevel disc degeneration. Advanced facet arthrosis at C2-3 and C3-4. Moderate bilateral neural foraminal stenosis at C5-6 and C6-7. No evidence of high-grade spinal stenosis. Upper chest: Scarring in the lung apices. Other: Heavy atherosclerotic calcification of the carotid bifurcations. Critical Value/emergent results were called by telephone at the time of interpretation on 11/04/2023 at 6:13 pm to Dr. Bethann Berkshire, who verbally acknowledged these results. IMPRESSION: 1. Acute left-sided subdural hematoma and subarachnoid hemorrhage. No midline shift. 2. No acute cervical spine fracture. Electronically Signed   By: Sebastian Ache M.D.   On: 11/04/2023 18:14   CT Cervical Spine Wo Contrast  Result Date: 11/04/2023 CLINICAL DATA:  Head trauma, intracranial arterial injury suspected;  Neck trauma (Age >= 65y). Fall. EXAM: CT HEAD WITHOUT CONTRAST CT CERVICAL SPINE WITHOUT CONTRAST TECHNIQUE: Multidetector CT imaging of the head and cervical spine was performed following the standard protocol without intravenous contrast. Multiplanar CT image reconstructions of the cervical spine were also generated. RADIATION DOSE REDUCTION: This exam was performed according to the departmental dose-optimization program which includes automated exposure control, adjustment of the mA and/or kV according to patient size and/or use of iterative reconstruction technique. COMPARISON:  Head CT 06/11/2023 FINDINGS: CT HEAD FINDINGS Brain: Small volume acute subarachnoid hemorrhage is present in the left sylvian fissure and adjacent sulci. A small acute subdural hematoma over the left cerebral convexity measures up to 9 mm in thickness without midline shift or other significant mass effect. No parenchymal hemorrhage or acute infarct is evident. There is mild cerebral atrophy. Hypodensities in the cerebral white matter are similar to the prior study and are nonspecific but compatible with mild chronic small vessel ischemic disease. Vascular: Calcified atherosclerosis at the skull base. Skull: No acute fracture or suspicious osseous lesion. Sinuses/Orbits: Paranasal sinuses and mastoid air cells are clear. Bilateral cataract extraction. Other: Large left parieto-occipital scalp hematoma. CT CERVICAL SPINE FINDINGS Alignment: Minimal anterolisthesis of C3 on C4, likely degenerative. Skull base and vertebrae: No acute fracture or destructive lesion. Diffuse osteopenia. Subcentimeter sclerotic focus in the C7 vertebral body, likely a bone island. Soft tissues and spinal canal: No prevertebral fluid or swelling. No visible canal hematoma. Disc levels: Mild-to-moderate multilevel disc degeneration. Advanced facet arthrosis at C2-3 and C3-4. Moderate bilateral neural foraminal stenosis at C5-6 and C6-7. No evidence of high-grade  spinal stenosis. Upper chest: Scarring in the lung apices. Other: Heavy atherosclerotic calcification of the carotid bifurcations. Critical Value/emergent results were called by telephone at the time of interpretation on 11/04/2023 at 6:13 pm to Dr. Bethann Berkshire, who verbally acknowledged these results.  IMPRESSION: 1. Acute left-sided subdural hematoma and subarachnoid hemorrhage. No midline shift. 2. No acute cervical spine fracture. Electronically Signed   By: Sebastian Ache M.D.   On: 11/04/2023 18:14    Pending Labs Unresulted Labs (From admission, onward)     Start     Ordered   11/04/23 2114  Urinalysis, w/ Reflex to Culture (Infection Suspected) -Urine, Clean Catch  (Urine Labs)  Once,   R       Question:  Specimen Source  Answer:  Urine, Clean Catch   11/04/23 2113            Vitals/Pain Today's Vitals   11/05/23 0715 11/05/23 0902 11/05/23 0906 11/05/23 1000  BP:  121/74  132/60  Pulse: 91 89  88  Resp: 19 20  (!) 23  Temp:   98.1 F (36.7 C)   TempSrc:      SpO2: 97% 95%  97%  Weight:      Height:        Isolation Precautions No active isolations  Medications Medications  metoprolol tartrate (LOPRESSOR) injection 5 mg (has no administration in time range)  acetaminophen (TYLENOL) tablet 650 mg (has no administration in time range)    Or  acetaminophen (TYLENOL) suppository 650 mg (has no administration in time range)  levETIRAcetam (KEPPRA) IVPB 500 mg/100 mL premix (0 mg Intravenous Stopped 11/05/23 0851)  0.9 %  sodium chloride infusion ( Intravenous New Bag/Given 11/04/23 2142)  QUEtiapine (SEROQUEL) tablet 50 mg (has no administration in time range)  melatonin tablet 3 mg (has no administration in time range)  LORazepam (ATIVAN) injection 1 mg (1 mg Intramuscular Given 11/04/23 1743)  levETIRAcetam (KEPPRA) IVPB 500 mg/100 mL premix (0 mg Intravenous Stopped 11/04/23 1954)  amLODipine (NORVASC) tablet 10 mg (10 mg Oral Given 11/04/23 1928)     Mobility walks     Focused Assessments    R Recommendations: See Admitting Provider Note  Report given to:   Additional Notes: Patient has family at bedside that has been helpful with feeding and toileting. Patient is alert to self but trying to feed herself and does try to get up and pull at things. Patient is wearing mitts when she is not trying to feed herself which has been helpful.

## 2023-11-05 NOTE — Consult Note (Signed)
Reason for Consult: Subdural hematoma Referring Physician: Emergency department  Katie Jackson is an 77 y.o. female.  HPI: 77 year old female suffered a mechanical fall yesterday.  Patient struck her head.  Unknown loss of consciousness.  Patient with history of Alzheimer's type dementia with moderate chronic renal failure.  Patient has been hemodynamically stable.  She not on anticoagulants.  Workup demonstrates a left convexity acute subdural hematoma with minimal mass effect.  Follow-up scan 6 hours later demonstrates some mild enlargement of the hematoma still without significant mass effect.  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 No pertinent family history.  Social History:  reports that she has quit smoking. Her smoking use included cigarettes. She has never used smokeless tobacco. She reports that she does not currently use alcohol. She reports that she does not use drugs.  Allergies:  Allergies  Allergen Reactions   Penicillins Other (See Comments)    From childhood; reaction not recalled.    Medications: I have reviewed the patient's current medications.  Results for orders placed or performed during the hospital  encounter of 11/04/23 (from the past 48 hour(s))  CBC with Differential     Status: Abnormal   Collection Time: 11/04/23  7:15 PM  Result Value Ref Range   WBC 10.2 4.0 - 10.5 K/uL   RBC 4.87 3.87 - 5.11 MIL/uL   Hemoglobin 11.9 (L) 12.0 - 15.0 g/dL   HCT 46.9 62.9 - 52.8 %   MCV 78.0 (L) 80.0 - 100.0 fL   MCH 24.4 (L) 26.0 - 34.0 pg   MCHC 31.3 30.0 - 36.0 g/dL   RDW 41.3 (H) 24.4 - 01.0 %   Platelets 289 150 - 400 K/uL   nRBC 0.0 0.0 - 0.2 %   Neutrophils Relative % 68 %   Neutro Abs 7.0 1.7 - 7.7 K/uL   Lymphocytes Relative 22 %   Lymphs Abs 2.3 0.7 - 4.0 K/uL   Monocytes Relative 7 %   Monocytes Absolute 0.7 0.1 - 1.0 K/uL   Eosinophils Relative 1 %   Eosinophils Absolute 0.1 0.0 - 0.5 K/uL   Basophils Relative 1 %   Basophils Absolute 0.1 0.0 - 0.1 K/uL   Immature Granulocytes 1 %   Abs Immature Granulocytes 0.05 0.00 - 0.07 K/uL    Comment: Performed at [facility], [address], [place], [place] [address]  Basic metabolic panel     Status: Abnormal   Collection Time: 11/04/23  7:15 PM  Result Value Ref Range   Sodium 137 135 - 145 mmol/L   Potassium 3.7 3.5 - 5.1  mmol/L   Chloride 104 98 - 111 mmol/L   CO2 23 22 - 32 mmol/L   Glucose, Bld 106 (H) 70 - 99 mg/dL    Comment: Glucose reference range applies only to samples taken after fasting for at least 8 hours.   BUN 12 8 - 23 mg/dL   Creatinine, Ser 4.09 (H) 0.44 - 1.00 mg/dL   Calcium 9.3 8.9 - 81.1 mg/dL   GFR, Estimated 53 (L) >60 mL/min    Comment: (NOTE) Calculated using the CKD-EPI Creatinine Equation (2021)    Anion gap 10 5 - 15    Comment: Performed at [facility], [address] [place], [place] [address]  Basic metabolic panel     Status: Abnormal   Collection Time: 11/05/23  2:16 AM  Result Value Ref Range   Sodium 137 135 - 145 mmol/L   Potassium 3.8 3.5 - 5.1 mmol/L   Chloride 107 98 - 111 mmol/L   CO2 23 22 - 32 mmol/L   Glucose, Bld 104 (H) 70 - 99 mg/dL    Comment: Glucose  reference range applies only to samples taken after fasting for at least 8 hours.   BUN 10 8 - 23 mg/dL   Creatinine, Ser 2.95 0.44 - 1.00 mg/dL   Calcium 9.0 8.9 - 62.1 mg/dL   GFR, Estimated 57 (L) >60 mL/min    Comment: (NOTE) Calculated using the CKD-EPI Creatinine Equation (2021)    Anion gap 7 5 - 15    Comment: Performed at [facility], [address] [place], [place] [address]  Magnesium     Status: None   Collection Time: 11/05/23  2:16 AM  Result Value Ref Range   Magnesium 2.1 1.7 - 2.4 mg/dL    Comment: Performed at [facility], [address] [place], [place] [address]  CBC     Status: Abnormal   Collection Time: 11/05/23  2:16 AM  Result Value Ref Range   WBC 10.8 (H) 4.0 - 10.5 K/uL   RBC 4.57 3.87 - 5.11 MIL/uL   Hemoglobin 11.1 (L) 12.0 - 15.0 g/dL   HCT 62.9 (L) 52.8 - 41.3 %   MCV 78.3 (L) 80.0 - 100.0 fL   MCH 24.3 (L) 26.0 - 34.0 pg   MCHC 31.0 30.0 - 36.0 g/dL   RDW 24.4 (H) 01.0 - 27.2 %   Platelets 264 150 - 400 K/uL   nRBC 0.0 0.0 - 0.2 %    Comment: Performed at [facility], [address] [place], [place] [address]  Protime-INR     Status: None   Collection Time: 11/05/23  2:16 AM  Result Value Ref Range   Prothrombin Time 14.4 11.4 - 15.2 seconds   INR 1.1 0.8 - 1.2    Comment: (NOTE) INR goal varies based on device and disease states. Performed at [facility], [address] [place], [place] [address]     CT HEAD WO CONTRAST ( )  Result Date: 11/05/2023 CLINICAL DATA:  Follow-up subdural hematoma EXAM: CT HEAD WITHOUT CONTRAST TECHNIQUE: Contiguous axial images were obtained from the base of the skull through the vertex without intravenous contrast. RADIATION DOSE REDUCTION: This exam was performed according to the departmental dose-optimization program which includes automated exposure control, adjustment of the mA and/or kV according to patient size and/or use of iterative reconstruction technique.  COMPARISON:  Earlier the same day at 12:06 FINDINGS: Brain: Subdural hematoma along the left  cerebral convexity shows no interval enlargement, maximal thickness on coronal reformats measures 12 mm. Local subarachnoid hemorrhage along the sylvian fissure. No significant mass effect given underlying brain atrophy. No evidence of infarct, hydrocephalus, or mass lesion. Brain atrophy Vascular: No hyperdense vessel or unexpected calcification. Skull: Normal. Negative for fracture or focal lesion. Sinuses/Orbits: No acute finding. IMPRESSION: Unchanged subdural hematoma on the left with local subarachnoid hemorrhage. Maximal subdural thickness is 12 mm but mass effect is mild in the setting of brain atrophy. No interval progression. Electronically Signed   By: Tiburcio Pea M.D.   On: 11/05/2023 06:23   CT HEAD WO CONTRAST ( )  Result Date: 11/05/2023 CLINICAL DATA:  Subdural hematoma EXAM: CT HEAD WITHOUT CONTRAST TECHNIQUE: Contiguous axial images were obtained from the base of the skull through the vertex without intravenous contrast. RADIATION DOSE REDUCTION: This exam was performed according to the departmental dose-optimization program which includes automated exposure control, adjustment of the mA and/or kV according to patient size and/or use of iterative reconstruction technique. COMPARISON:  11/04/2023 FINDINGS: Brain: Enlarging acute left subdural hematoma, measuring up to 12 mm in thickness compared to 9 mm previously. Subarachnoid blood again noted in the sylvian fissure and adjacent sulci, stable. No midline shift. No acute infarction or hydrocephalus. Vascular: No hyperdense vessel or unexpected calcification. Skull: No acute calvarial abnormality. Sinuses/Orbits: No acute findings Other: Large left parietooccipital scalp hematoma. This is unchanged. IMPRESSION: Stable left subarachnoid hemorrhage. Enlarging left subdural hematoma, now 12 mm in thickness. No midline shift. Electronically Signed    By: Charlett Nose M.D.   On: 11/05/2023 00:18   CT Head Wo Contrast  Result Date: 11/04/2023 CLINICAL DATA:  Head trauma, intracranial arterial injury suspected; Neck trauma (Age >= 65y). Fall. EXAM: CT HEAD WITHOUT CONTRAST CT CERVICAL SPINE WITHOUT CONTRAST TECHNIQUE: Multidetector CT imaging of the head and cervical spine was performed following the standard protocol without intravenous contrast. Multiplanar CT image reconstructions of the cervical spine were also generated. RADIATION DOSE REDUCTION: This exam was performed according to the departmental dose-optimization program which includes automated exposure control, adjustment of the mA and/or kV according to patient size and/or use of iterative reconstruction technique. COMPARISON:  Head CT 06/11/2023 FINDINGS: CT HEAD FINDINGS Brain: Small volume acute subarachnoid hemorrhage is present in the left sylvian fissure and adjacent sulci. A small acute subdural hematoma over the left cerebral convexity measures up to 9 mm in thickness without midline shift or other significant mass effect. No parenchymal hemorrhage or acute infarct is evident. There is mild cerebral atrophy. Hypodensities in the cerebral white matter are similar to the prior study and are nonspecific but compatible with mild chronic small vessel ischemic disease. Vascular: Calcified atherosclerosis at the skull base. Skull: No acute fracture or suspicious osseous lesion. Sinuses/Orbits: Paranasal sinuses and mastoid air cells are clear. Bilateral cataract extraction. Other: Large left parieto-occipital scalp hematoma. CT CERVICAL SPINE FINDINGS Alignment: Minimal anterolisthesis of C3 on C4, likely degenerative. Skull base and vertebrae: No acute fracture or destructive lesion. Diffuse osteopenia. Subcentimeter sclerotic focus in the C7 vertebral body, likely a bone island. Soft tissues and spinal canal: No prevertebral fluid or swelling. No visible canal hematoma. Disc levels:  Mild-to-moderate multilevel disc degeneration. Advanced facet arthrosis at C2-3 and C3-4. Moderate bilateral neural foraminal stenosis at C5-6 and C6-7. No evidence of high-grade spinal stenosis. Upper chest: Scarring in the lung apices. Other: Heavy atherosclerotic calcification of the carotid bifurcations. Critical Value/emergent results were called by telephone at the time of  interpretation on 11/04/2023 at 6:13 pm to Dr. Bethann Berkshire, who verbally acknowledged these results. IMPRESSION: 1. Acute left-sided subdural hematoma and subarachnoid hemorrhage. No midline shift. 2. No acute cervical spine fracture. Electronically Signed   By: Sebastian Ache M.D.   On: 11/04/2023 18:14   CT Cervical Spine Wo Contrast  Result Date: 11/04/2023 CLINICAL DATA:  Head trauma, intracranial arterial injury suspected; Neck trauma (Age >= 65y). Fall. EXAM: CT HEAD WITHOUT CONTRAST CT CERVICAL SPINE WITHOUT CONTRAST TECHNIQUE: Multidetector CT imaging of the head and cervical spine was performed following the standard protocol without intravenous contrast. Multiplanar CT image reconstructions of the cervical spine were also generated. RADIATION DOSE REDUCTION: This exam was performed according to the departmental dose-optimization program which includes automated exposure control, adjustment of the mA and/or kV according to patient size and/or use of iterative reconstruction technique. COMPARISON:  Head CT 06/11/2023 FINDINGS: CT HEAD FINDINGS Brain: Small volume acute subarachnoid hemorrhage is present in the left sylvian fissure and adjacent sulci. A small acute subdural hematoma over the left cerebral convexity measures up to 9 mm in thickness without midline shift or other significant mass effect. No parenchymal hemorrhage or acute infarct is evident. There is mild cerebral atrophy. Hypodensities in the cerebral white matter are similar to the prior study and are nonspecific but compatible with mild chronic small vessel  ischemic disease. Vascular: Calcified atherosclerosis at the skull base. Skull: No acute fracture or suspicious osseous lesion. Sinuses/Orbits: Paranasal sinuses and mastoid air cells are clear. Bilateral cataract extraction. Other: Large left parieto-occipital scalp hematoma. CT CERVICAL SPINE FINDINGS Alignment: Minimal anterolisthesis of C3 on C4, likely degenerative. Skull base and vertebrae: No acute fracture or destructive lesion. Diffuse osteopenia. Subcentimeter sclerotic focus in the C7 vertebral body, likely a bone island. Soft tissues and spinal canal: No prevertebral fluid or swelling. No visible canal hematoma. Disc levels: Mild-to-moderate multilevel disc degeneration. Advanced facet arthrosis at C2-3 and C3-4. Moderate bilateral neural foraminal stenosis at C5-6 and C6-7. No evidence of high-grade spinal stenosis. Upper chest: Scarring in the lung apices. Other: Heavy atherosclerotic calcification of the carotid bifurcations. Critical Value/emergent results were called by telephone at the time of interpretation on 11/04/2023 at 6:13 pm to Dr. Bethann Berkshire, who verbally acknowledged these results. IMPRESSION: 1. Acute left-sided subdural hematoma and subarachnoid hemorrhage. No midline shift. 2. No acute cervical spine fracture. Electronically Signed   By: Sebastian Ache M.D.   On: 11/04/2023 18:14    Review of systems not obtained due to patient factors. Blood pressure 132/60, pulse 88, temperature 98.1 F (36.7 C), resp. rate (!) 23, height 5\' 3"  (1.6 m), weight 59.9 kg, SpO2 97%. Patient is awake and alert.  She is oriented to person but not place or time.  Her speech is reasonably fluent.  She follows commands of both upper and lower extremities.  Her pupils are equal and reactive bilaterally.  Gaze is conjugate.  Face is symmetric.  Examination head ears eyes and throat demonstrates some scalp bruising without significant bony abnormality or laceration.  Oropharynx, nasopharynx and external  auditory canals are clear.  Chest and abdomen benign.  Assessment/Plan: Significant left convexity subdural hematoma.  Patient situation complicated by Alzheimer's type dementia and poor overall functional status.  Very much like to avoid surgery if possible in this lady.  At this point plan for admission and observation.  Plan to recheck CT scan in the morning.  The patient neurologically worsens will check a more urgent repeat CT scan.  Sherilyn Cooter A Izayiah Tibbitts 11/05/2023, 10:39 AM

## 2023-11-05 NOTE — Plan of Care (Signed)
  Problem: Safety: Goal: Non-violent Restraint(s) Outcome: Progressing   Problem: Education: Goal: Knowledge of General Education information will improve Description: Including pain rating scale, medication(s)/side effects and non-pharmacologic comfort measures Outcome: Progressing   Problem: Health Behavior/Discharge Planning: Goal: Ability to manage health-related needs will improve Outcome: Progressing   

## 2023-11-05 NOTE — ED Notes (Signed)
Pt rolled and diaper changed, pericare performed.

## 2023-11-05 NOTE — ED Notes (Signed)
Patient placed on transport monitor with CCMD.

## 2023-11-05 NOTE — Progress Notes (Signed)
Patient agitated, attempting to remove mittens, yelling, using SCD tubing to pull up to get out of bed, rocking back and forth to get up out of bed. Family at bedside attempting to calm patient. MD notified. Orders received for IV ativan 1mg .  Per patient's daughter, patient was able to remove mittens with mouth on previous admissions and has fallen on previous admission. Patient's daughter says patient " sundowns" and has become harder to care for dt dementia. Patient's daughter also said some of patient's medication for her dementia was discontinued by PCP.

## 2023-11-05 NOTE — Plan of Care (Signed)
  Problem: Safety: Goal: Non-violent Restraint(s) Outcome: Progressing   Problem: Education: Goal: Knowledge of General Education information will improve Description: Including pain rating scale, medication(s)/side effects and non-pharmacologic comfort measures Outcome: Not Progressing   Problem: Health Behavior/Discharge Planning: Goal: Ability to manage health-related needs will improve Outcome: Not Progressing   Problem: Clinical Measurements: Goal: Ability to maintain clinical measurements within normal limits will improve Outcome: Not Progressing Goal: Will remain free from infection Outcome: Not Progressing Goal: Diagnostic test results will improve Outcome: Not Progressing Goal: Respiratory complications will improve Outcome: Not Progressing Goal: Cardiovascular complication will be avoided Outcome: Not Progressing   Problem: Activity: Goal: Risk for activity intolerance will decrease Outcome: Not Progressing   Problem: Nutrition: Goal: Adequate nutrition will be maintained Outcome: Not Progressing   Problem: Coping: Goal: Level of anxiety will decrease Outcome: Not Progressing   Problem: Elimination: Goal: Will not experience complications related to bowel motility Outcome: Not Progressing Goal: Will not experience complications related to urinary retention Outcome: Not Progressing   Problem: Pain Management: Goal: General experience of comfort will improve Outcome: Not Progressing   Problem: Safety: Goal: Ability to remain free from injury will improve Outcome: Not Progressing   Problem: Skin Integrity: Goal: Risk for impaired skin integrity will decrease Outcome: Not Progressing

## 2023-11-06 DIAGNOSIS — G9341 Metabolic encephalopathy: Secondary | ICD-10-CM | POA: Diagnosis not present

## 2023-11-06 DIAGNOSIS — S065XAA Traumatic subdural hemorrhage with loss of consciousness status unknown, initial encounter: Secondary | ICD-10-CM | POA: Diagnosis not present

## 2023-11-06 MED ORDER — ORAL CARE MOUTH RINSE
15.0000 mL | OROMUCOSAL | Status: DC | PRN
Start: 2023-11-06 — End: 2023-11-07

## 2023-11-06 MED ORDER — ORAL CARE MOUTH RINSE
15.0000 mL | OROMUCOSAL | Status: DC
  Administered 2023-11-06 – 2023-11-07 (×4): 15 mL via OROMUCOSAL

## 2023-11-06 NOTE — Evaluation (Signed)
Physical Therapy Evaluation Patient Details Name: Katie Jackson MRN: [id number] DOB: January 09, 1946 Today's Date: 11/06/2023  History of Present Illness  Katie Jackson is a 77 yo female who presented after a fall backwards off of steps.  CT head showed subdural hematoma enlarging from 9 mm to 12 mm. PMHx: Alzheimer's dementia with behavioral symptoms, HTN, HLD, CKD, COPD  Clinical Impression  PTA pt was living in senior apartment where her granddaughter with the help of her daughter provided 24 hour care and assist with walking and total A for ADLs. On evaluation, pt obtunded and with maximal stimulation and assist from therapy. Pt brought to [facility] where she never opened her eyes, but was able to indicate negatively to an incorrect name and then positively to her name. 1x command follow for squeezing bilateral PT hands. Pt daughter present throughout session and reports that Katie Jackson not her normal and thought to be due to sedating medications given over night. Daughter request assist to get pt into Memory Care facility, but does not want placement in SNF. PT recommending HHPT at discharge and will continue to follow acutely.         If plan is discharge home, recommend the following: A lot of help with walking and/or transfers;A lot of help with bathing/dressing/bathroom;Assistance with cooking/housework;Direct supervision/assist for medications management;Direct supervision/assist for financial management;Assist for transportation;Help with stairs or ramp for entrance;Supervision due to cognitive status   Can travel by private vehicle    ?    Equipment Recommendations None recommended by PT     Functional Status Assessment Patient has had a recent decline in their functional status and demonstrates the ability to make significant improvements in function in a reasonable and predictable amount of time.     Precautions / Restrictions Precautions Precautions: Fall Precaution Comments:  reason for hospitalization, fall 6 months ago Restrictions Weight Bearing Restrictions: No      Mobility  Bed Mobility Overal bed mobility: Needs Assistance Bed Mobility: Rolling, Sidelying to Sit, Sit to Supine Rolling: Total assist, +2 for physical assistance, +2 for safety/equipment Sidelying to sit: Total assist, +2 for physical assistance, +2 for safety/equipment   Sit to supine: Total assist, +2 for physical assistance, +2 for safety/equipment   General bed mobility comments: due to poor level of arousal        Balance Overall balance assessment: Needs assistance Sitting-balance support: Feet supported Sitting balance-Leahy Scale: Poor Sitting balance - Comments: initially total A for sitting balance, progressed to close CGA                                     Pertinent Vitals/Pain Pain Assessment Pain Assessment: Faces Faces Pain Scale: No hurt Pain Intervention(s): Monitored during session    Home Living Family/patient expects to be discharged to:: Private residence Living Arrangements: Other relatives Available Help at Discharge: Family;Available 24 hours/day Type of Home: Apartment Home Access: Level entry       Home Layout: One level Home Equipment: Pharmacist, hospital (2 wheels);Rollator (4 wheels);Cane - single point;BSC/3in1;Hand held shower head Additional Comments: pt fell going up 2 steps at dtr's house. Pt's granddaughter assists pt 24/7    Prior Function Prior Level of Function : Needs assist             Mobility Comments: walks independently, only other fall was 6 months ago at dts house ADLs Comments: max A for ADLs, family  provides     Extremity/Trunk Assessment   Upper Extremity Assessment Upper Extremity Assessment: Defer to OT evaluation    Lower Extremity Assessment Lower Extremity Assessment: Generalized weakness;Difficult to assess due to impaired cognition (low level of arousal)    Cervical / Trunk  Assessment Cervical / Trunk Assessment: Normal  Communication   Communication Communication: Difficulty following commands/understanding;Difficulty communicating thoughts/reduced clarity of speech  Cognition Arousal: Obtunded Behavior During Therapy: Flat affect Overall Cognitive Status: History of cognitive impairments - at baseline                                 General Comments: unable to rouse despite positional changes. Pt maintained eyes closed throughout session, pt did mumble "yes/no" intermittently. Per dtr, pt is typically not able to conversate but follows some commands at baseline        General Comments General comments (skin integrity, edema, etc.): VSS, daughter present        Assessment/Plan    PT Assessment Patient needs continued PT services  PT Problem List Decreased strength;Decreased activity tolerance;Decreased balance;Decreased mobility;Decreased cognition       PT Treatment Interventions Gait training;DME instruction;Functional mobility training;Therapeutic activities;Therapeutic exercise;Balance training;Cognitive remediation;Patient/family education;Neuromuscular re-education    PT Goals (Current goals can be found in the Care Plan section)  Acute Rehab PT Goals PT Goal Formulation: With patient/family Time For Goal Achievement: 11/20/23 Potential to Achieve Goals: Fair    Frequency Min 1X/week        AM-PAC PT "6 Clicks" Mobility  Outcome Measure Help needed turning from your back to your side while in a flat bed without using bedrails?: Total Help needed moving from lying on your back to sitting on the side of a flat bed without using bedrails?: Total Help needed moving to and from a bed to a chair (including a wheelchair)?: Total Help needed standing up from a chair using your arms (e.g., wheelchair or bedside chair)?: Total Help needed to walk in hospital room?: Total Help needed climbing 3-5 steps with a railing? : Total 6  Click Score: 6    End of Session   Activity Tolerance: Patient limited by lethargy Patient left: in bed;with call bell/phone within reach;with bed alarm set;with family/visitor present;Other (comment) (placed in chair position for maximal upright to facilitate more stimulation) Nurse Communication: Mobility status PT Visit Diagnosis: Muscle weakness (generalized) (M62.81);Repeated falls (R29.6);Other symptoms and signs involving the nervous system (R29.898)    Time: 8119-1478 PT Time Calculation (min) (ACUTE ONLY): 39 min   Charges:   PT Evaluation $PT Eval Moderate Complexity: 1 Mod   PT General Charges $$ ACUTE PT VISIT: 1 Visit         Oakley Kossman B. Beverely Risen PT, DPT Acute Rehabilitation Services Please use secure chat or  Call Office [phone]   [facility] 11/06/2023, 11:48 AM

## 2023-11-06 NOTE — Progress Notes (Signed)
Subjective: Patient reports no headaches  Objective: Vital signs in last 24 hours: Temp:  [98.1 F (36.7 C)-98.7 F (37.1 C)] 98.1 F (36.7 C) (11/18 1530) Pulse Rate:  [80-97] 89 (11/18 1530) Resp:  [16-20] 18 (11/18 1530) BP: (108-158)/(42-78) 127/57 (11/18 1530) SpO2:  [93 %-97 %] 96 % (11/18 1530)  Intake/Output from previous day: 11/17 0701 - 11/18 0700 In: 965 [I.V.:865; IV Piggyback:100] Out: -  Intake/Output this shift: No intake/output data recorded.  Awake, appears alert.  Some dysarthria.  Oriented to person only No focal weakness apparent  Lab Results: Recent Labs    11/04/23 1915 11/05/23 0216  WBC 10.2 10.8*  HGB 11.9* 11.1*  HCT 38.0 35.8*  PLT 289 264   BMET Recent Labs    11/04/23 1915 11/05/23 0216  NA 137 137  K 3.7 3.8  CL 104 107  CO2 23 23  GLUCOSE 106* 104*  BUN 12 10  CREATININE 1.06* 1.00  CALCIUM 9.3 9.0    Studies/Results: CT HEAD WO CONTRAST ( )  Result Date: 11/05/2023 CLINICAL DATA:  Follow-up subdural hematoma EXAM: CT HEAD WITHOUT CONTRAST TECHNIQUE: Contiguous axial images were obtained from the base of the skull through the vertex without intravenous contrast. RADIATION DOSE REDUCTION: This exam was performed according to the departmental dose-optimization program which includes automated exposure control, adjustment of the mA and/or kV according to patient size and/or use of iterative reconstruction technique. COMPARISON:  Earlier the same day at 12:06 FINDINGS: Brain: Subdural hematoma along the left cerebral convexity shows no interval enlargement, maximal thickness on coronal reformats measures 12 mm. Local subarachnoid hemorrhage along the sylvian fissure. No significant mass effect given underlying brain atrophy. No evidence of infarct, hydrocephalus, or mass lesion. Brain atrophy Vascular: No hyperdense vessel or unexpected calcification. Skull: Normal. Negative for fracture or focal lesion. Sinuses/Orbits: No acute  finding. IMPRESSION: Unchanged subdural hematoma on the left with local subarachnoid hemorrhage. Maximal subdural thickness is 12 mm but mass effect is mild in the setting of brain atrophy. No interval progression. Electronically Signed   By: Tiburcio Pea M.D.   On: 11/05/2023 06:23   CT HEAD WO CONTRAST ( )  Result Date: 11/05/2023 CLINICAL DATA:  Subdural hematoma EXAM: CT HEAD WITHOUT CONTRAST TECHNIQUE: Contiguous axial images were obtained from the base of the skull through the vertex without intravenous contrast. RADIATION DOSE REDUCTION: This exam was performed according to the departmental dose-optimization program which includes automated exposure control, adjustment of the mA and/or kV according to patient size and/or use of iterative reconstruction technique. COMPARISON:  11/04/2023 FINDINGS: Brain: Enlarging acute left subdural hematoma, measuring up to 12 mm in thickness compared to 9 mm previously. Subarachnoid blood again noted in the sylvian fissure and adjacent sulci, stable. No midline shift. No acute infarction or hydrocephalus. Vascular: No hyperdense vessel or unexpected calcification. Skull: No acute calvarial abnormality. Sinuses/Orbits: No acute findings Other: Large left parietooccipital scalp hematoma. This is unchanged. IMPRESSION: Stable left subarachnoid hemorrhage. Enlarging left subdural hematoma, now 12 mm in thickness. No midline shift. Electronically Signed   By: Charlett Nose M.D.   On: 11/05/2023 00:18   CT Head Wo Contrast  Result Date: 11/04/2023 CLINICAL DATA:  Head trauma, intracranial arterial injury suspected; Neck trauma (Age >= 65y). Fall. EXAM: CT HEAD WITHOUT CONTRAST CT CERVICAL SPINE WITHOUT CONTRAST TECHNIQUE: Multidetector CT imaging of the head and cervical spine was performed following the standard protocol without intravenous contrast. Multiplanar CT image reconstructions of the cervical spine were also generated. RADIATION  DOSE REDUCTION: This exam  was performed according to the departmental dose-optimization program which includes automated exposure control, adjustment of the mA and/or kV according to patient size and/or use of iterative reconstruction technique. COMPARISON:  Head CT 06/11/2023 FINDINGS: CT HEAD FINDINGS Brain: Small volume acute subarachnoid hemorrhage is present in the left sylvian fissure and adjacent sulci. A small acute subdural hematoma over the left cerebral convexity measures up to 9 mm in thickness without midline shift or other significant mass effect. No parenchymal hemorrhage or acute infarct is evident. There is mild cerebral atrophy. Hypodensities in the cerebral white matter are similar to the prior study and are nonspecific but compatible with mild chronic small vessel ischemic disease. Vascular: Calcified atherosclerosis at the skull base. Skull: No acute fracture or suspicious osseous lesion. Sinuses/Orbits: Paranasal sinuses and mastoid air cells are clear. Bilateral cataract extraction. Other: Large left parieto-occipital scalp hematoma. CT CERVICAL SPINE FINDINGS Alignment: Minimal anterolisthesis of C3 on C4, likely degenerative. Skull base and vertebrae: No acute fracture or destructive lesion. Diffuse osteopenia. Subcentimeter sclerotic focus in the C7 vertebral body, likely a bone island. Soft tissues and spinal canal: No prevertebral fluid or swelling. No visible canal hematoma. Disc levels: Mild-to-moderate multilevel disc degeneration. Advanced facet arthrosis at C2-3 and C3-4. Moderate bilateral neural foraminal stenosis at C5-6 and C6-7. No evidence of high-grade spinal stenosis. Upper chest: Scarring in the lung apices. Other: Heavy atherosclerotic calcification of the carotid bifurcations. Critical Value/emergent results were called by telephone at the time of interpretation on 11/04/2023 at 6:13 pm to Dr. Bethann Berkshire, who verbally acknowledged these results. IMPRESSION: 1. Acute left-sided subdural hematoma  and subarachnoid hemorrhage. No midline shift. 2. No acute cervical spine fracture. Electronically Signed   By: Sebastian Ache M.D.   On: 11/04/2023 18:14   CT Cervical Spine Wo Contrast  Result Date: 11/04/2023 CLINICAL DATA:  Head trauma, intracranial arterial injury suspected; Neck trauma (Age >= 65y). Fall. EXAM: CT HEAD WITHOUT CONTRAST CT CERVICAL SPINE WITHOUT CONTRAST TECHNIQUE: Multidetector CT imaging of the head and cervical spine was performed following the standard protocol without intravenous contrast. Multiplanar CT image reconstructions of the cervical spine were also generated. RADIATION DOSE REDUCTION: This exam was performed according to the departmental dose-optimization program which includes automated exposure control, adjustment of the mA and/or kV according to patient size and/or use of iterative reconstruction technique. COMPARISON:  Head CT 06/11/2023 FINDINGS: CT HEAD FINDINGS Brain: Small volume acute subarachnoid hemorrhage is present in the left sylvian fissure and adjacent sulci. A small acute subdural hematoma over the left cerebral convexity measures up to 9 mm in thickness without midline shift or other significant mass effect. No parenchymal hemorrhage or acute infarct is evident. There is mild cerebral atrophy. Hypodensities in the cerebral white matter are similar to the prior study and are nonspecific but compatible with mild chronic small vessel ischemic disease. Vascular: Calcified atherosclerosis at the skull base. Skull: No acute fracture or suspicious osseous lesion. Sinuses/Orbits: Paranasal sinuses and mastoid air cells are clear. Bilateral cataract extraction. Other: Large left parieto-occipital scalp hematoma. CT CERVICAL SPINE FINDINGS Alignment: Minimal anterolisthesis of C3 on C4, likely degenerative. Skull base and vertebrae: No acute fracture or destructive lesion. Diffuse osteopenia. Subcentimeter sclerotic focus in the C7 vertebral body, likely a bone island.  Soft tissues and spinal canal: No prevertebral fluid or swelling. No visible canal hematoma. Disc levels: Mild-to-moderate multilevel disc degeneration. Advanced facet arthrosis at C2-3 and C3-4. Moderate bilateral neural foraminal stenosis at C5-6 and  C6-7. No evidence of high-grade spinal stenosis. Upper chest: Scarring in the lung apices. Other: Heavy atherosclerotic calcification of the carotid bifurcations. Critical Value/emergent results were called by telephone at the time of interpretation on 11/04/2023 at 6:13 pm to Dr. Bethann Berkshire, who verbally acknowledged these results. IMPRESSION: 1. Acute left-sided subdural hematoma and subarachnoid hemorrhage. No midline shift. 2. No acute cervical spine fracture. Electronically Signed   By: Sebastian Ache M.D.   On: 11/04/2023 18:14    Assessment/Plan: 77 yo F with Alzheimer's dementia, L subdural hematoma after fall  LOS: 2 days  - no neurosurgical intervention indicated - she will need a repeat CT head in 2 weeks, can f/u in clinic after this.  She is at moderate risk of developing chronic subdural hematoma. - Keppra x 7 days   Bedelia Person 11/06/2023, 5:40 PM

## 2023-11-06 NOTE — TOC Initial Note (Signed)
Transition of Care [facility]) - Initial/Assessment Note    Patient Details  Name: Katie Jackson MRN: [id number] Date of Birth: May 03, 1946  [facility] CM/SW Contact:    Kermit Balo, RN Phone Number: 11/06/2023, 1:24 PM  Clinical Narrative:                  Pt is from a senior apartment. Her granddaughter has been staying with her. Her daughter is over daily to assist with meals and ADL's.   She does receive Meals on Wheels.   Family manages her medications and provides needed transportation.   Daughter would ultimately like to get her into a memory care. CM has sent a referral to [facility] to see if they can assist in the process.   Home health recommended and daughter in agreement. Choice provided with Medicare.gov and daughter without a preference. CM has arranged home health with [facility]. CM has added a SW to [facility] to assist in getting the patient into a memory care. [facility] will contact the daughter for the first home visit. Information on the AVS.   Daughter interested in aide for home. CM has filled out the medicaid aide form and will fax it to [employer] for approval. CM has also encouraged the daughter to get the patient on the CAPS wait list to get more caregiver services in the home.   TOC following.  Expected Discharge Plan: Home w Home Health Services Barriers to Discharge: Continued Medical Work up   Patient Goals and CMS Choice   CMS Medicare.gov Compare Post Acute Care list provided to:: Patient Represenative (must comment) Choice offered to / list presented to : Adult Children      Expected Discharge Plan and Services   Discharge Planning Services: CM Consult Post Acute Care Choice: Home Health Living arrangements for the past 2 months: Apartment                           HH Arranged: PT, OT, Social Work, Advertising account executive HH Agency: [facility] Date [facility] Agency Contacted: 11/06/23   Representative spoke  with at [facility] Agency: Tresa Endo  Prior Living Arrangements/Services Living arrangements for the past 2 months: Apartment Lives with:: Other (Comment) (Granddaughter) Patient language and need for interpreter reviewed:: Yes Do you feel safe going back to the place where you live?: Yes      Need for Family Participation in Patient Care: Yes (Comment) Care giver support system in place?: Yes (comment) Current home services: DME (shower seat/ rollator/ bars in bathroom) Criminal Activity/Legal Involvement Pertinent to Current Situation/Hospitalization: No - Comment as needed  Activities of Daily Living   ADL Screening (condition at time of admission) Independently performs ADLs?: No Does the patient have a NEW difficulty with bathing/dressing/toileting/self-feeding that is expected to last >3 days?: Yes (Initiates electronic notice to provider for possible OT consult) Does the patient have a NEW difficulty with getting in/out of bed, walking, or climbing stairs that is expected to last >3 days?: Yes (Initiates electronic notice to provider for possible PT consult) Does the patient have a NEW difficulty with communication that is expected to last >3 days?: Yes (Initiates electronic notice to provider for possible SLP consult) Is the patient deaf or have difficulty hearing?: No Does the patient have difficulty seeing, even when wearing glasses/contacts?: No Does the patient have difficulty concentrating, remembering, or making decisions?: Yes  Permission Sought/Granted  Emotional Assessment Appearance:: Appears stated age     Orientation: : Oriented to Self   Psych Involvement: No (comment)  Admission diagnosis:  SDH (subdural hematoma) (HCC) [S06.5XAA] Injury of head, initial encounter [S09.90XA] Fall, initial encounter [W19.XXXA] Patient Active Problem List   Diagnosis Date Noted   SDH (subdural hematoma) (HCC) 11/04/2023   UTI (urinary tract infection) 06/12/2023    Acute encephalopathy 06/11/2023   Stage 3a chronic kidney disease (CKD) (HCC) 06/11/2023   Acute cystitis 04/22/2023   HTN (hypertension) 04/22/2023   Dementia with behavioral disturbance (HCC) 02/28/2020   Pressure injury of skin 02/26/2020   AKI (acute kidney injury) (HCC) 02/25/2020   PCP:  Georgann Housekeeper, MD Pharmacy:   [facility] [address], [place] - [address] [place] [address] Phone: [phone] Fax: [phone]     Social Determinants of Health (SDOH) Social History: SDOH Screenings   Food Insecurity: Patient Unable To Answer (11/04/2023)  Housing: High Risk (11/04/2023)  Transportation Needs: Patient Unable To Answer (11/04/2023)  Utilities: Patient Unable To Answer (11/04/2023)  Tobacco Use: Medium Risk (11/04/2023)   SDOH Interventions:     Readmission Risk Interventions    06/16/2023    9:49 AM 04/24/2023    4:40 PM  Readmission Risk Prevention Plan  Post Dischage Appt  Complete  Medication Screening  Complete  Transportation Screening Complete Complete  PCP or Specialist Appt within 5-7 Days Complete   Home Care Screening Complete   Medication Review (RN CM) Complete

## 2023-11-06 NOTE — Progress Notes (Signed)
Purewick placed to collect clean catch only

## 2023-11-06 NOTE — Evaluation (Signed)
Occupational Therapy Evaluation Patient Details Name: Katie Jackson MRN: [id number] DOB: 24-Apr-1946 Today's Date: 11/06/2023   History of Present Illness Katie Jackson is a 77 yo female who presented after a fall backwards off of steps.  CT head showed subdural hematoma enlarging from 9 mm to 12 mm. PMHx: Alzheimer's dementia with behavioral symptoms, HTN, HLD, CKD, COPD   Clinical Impression   Katie Jackson was evaluated s/p the above admission list. She lives in an apt with her granddaughter who assists with all aspects of ADLs at baseline, pt does not use AD and has had 2 falls at her daughters house in the last 6 months. Upon evaluation the pt was limited by low LOA with minimal improvement once sitting EOB, of not pt received sedating meds over night. Overall she needed total A +2 for all aspects of her care on evaluation. Once dependently transferred to the EOB, pt eventually progressed to CGA sitting balance and followed simple simple commands like "move your leg, and squeeze my hand." Pt will benefit from continued acute OT services and d/c back to pt's home with 24/7 direct care from family and HHOT per pt's daughters Katie Jackson) wishes.        If plan is discharge home, recommend the following: A lot of help with walking and/or transfers;A lot of help with bathing/dressing/bathroom;Assistance with cooking/housework;Assistance with feeding;Direct supervision/assist for medications management;Direct supervision/assist for financial management;Assist for transportation;Help with stairs or ramp for entrance;Supervision due to cognitive status    Functional Status Assessment  Patient has had a recent decline in their functional status and demonstrates the ability to make significant improvements in function in a reasonable and predictable amount of time.  Equipment Recommendations  None recommended by OT       Precautions / Restrictions Precautions Precautions: Fall Precaution  Comments: reason for hospitalization, fall 6 months ago Restrictions Weight Bearing Restrictions: No      Mobility Bed Mobility Overal bed mobility: Needs Assistance Bed Mobility: Rolling, Sidelying to Sit, Sit to Supine Rolling: Total assist, +2 for physical assistance, +2 for safety/equipment Sidelying to sit: Total assist, +2 for physical assistance, +2 for safety/equipment   Sit to supine: Total assist, +2 for physical assistance, +2 for safety/equipment        Transfers                          Balance Overall balance assessment: Needs assistance Sitting-balance support: Feet supported Sitting balance-Leahy Scale: Poor Sitting balance - Comments: initially total A for sitting balance, progressed to close CGA           ADL either performed or assessed with clinical judgement   ADL Overall ADL's : Needs assistance/impaired         General ADL Comments: total A for all aspects of care on evaluation due to LOA     Vision Baseline Vision/History: 1 Wears glasses Vision Assessment?: Vision impaired- to be further tested in functional context Additional Comments: likely WFL - eyes remained closed 100% of session     Perception Perception: Not tested       Praxis Praxis: Not tested       Pertinent Vitals/Pain Pain Assessment Pain Assessment: Faces Faces Pain Scale: No hurt Pain Intervention(s): Monitored during session     Extremity/Trunk Assessment Upper Extremity Assessment Upper Extremity Assessment: Generalized weakness;Difficult to assess due to impaired cognition (Noted good strength, pt resisting movement and following commands to squeeze bilaterally. Difficult to get  a functional assessment due to LOA)   Lower Extremity Assessment Lower Extremity Assessment: Defer to PT evaluation   Cervical / Trunk Assessment Cervical / Trunk Assessment: Normal   Communication Communication Communication: Difficulty following  commands/understanding;Difficulty communicating thoughts/reduced clarity of speech   Cognition Arousal: Obtunded Behavior During Therapy: Flat affect Overall Cognitive Status: History of cognitive impairments - at baseline                                 General Comments: unable to rouse despite positional changes. Pt maintained eyes closed throughout session, pt did mumble "yes/no" intermittently. Per dtr, pt is typically not able to conversate but follows some commands at baseline     General Comments  VSS, dtr present            Home Living Family/patient expects to be discharged to:: Private residence Living Arrangements: Other relatives Available Help at Discharge: Family;Available 24 hours/day Type of Home: Apartment Home Access: Level entry     Home Layout: One level     Bathroom Shower/Tub: Chief Strategy Officer: Handicapped height Bathroom Accessibility: Yes   Home Equipment: Pharmacist, hospital (2 wheels);Rollator (4 wheels);Cane - single point;BSC/3in1;Hand held shower head   Additional Comments: pt fell going up 2 steps at dtr's house. Pt's granddaughter assists pt 24/7      Prior Functioning/Environment Prior Level of Function : Needs assist       Mobility Comments: walks independently, only other fall was 6 months ago at dts house ADLs Comments: max A for ADLs, family provides        OT Problem List: Decreased activity tolerance;Impaired balance (sitting and/or standing);Decreased cognition;Decreased safety awareness;Decreased knowledge of use of DME or AE;Decreased knowledge of precautions      OT Treatment/Interventions: Self-care/ADL training;Therapeutic exercise;DME and/or AE instruction;Therapeutic activities;Patient/family education;Balance training    OT Goals(Current goals can be found in the care plan section) Acute Rehab OT Goals Patient Stated Goal: unable to state OT Goal Formulation: With patient Time  For Goal Achievement: 11/20/23 Potential to Achieve Goals: Fair ADL Goals Pt Will Perform Eating: with min assist;sitting Pt Will Perform Grooming: with mod assist;standing Pt Will Transfer to Toilet: with mod assist;ambulating;regular height toilet Additional ADL Goal #1: Pt will follow simple 1 step commands to complete ADLs with minimal repetition  OT Frequency: Min 1X/week       AM-PAC OT "6 Clicks" Daily Activity     Outcome Measure Help from another person eating meals?: Total Help from another person taking care of personal grooming?: Total Help from another person toileting, which includes using toliet, bedpan, or urinal?: Total Help from another person bathing (including washing, rinsing, drying)?: Total Help from another person to put on and taking off regular upper body clothing?: Total Help from another person to put on and taking off regular lower body clothing?: Total 6 Click Score: 6   End of Session Nurse Communication: Precautions;Mobility status  Activity Tolerance: Patient limited by lethargy Patient left: in bed;with call bell/phone within reach;with bed alarm set;with family/visitor present;with restraints reapplied  OT Visit Diagnosis: Unsteadiness on feet (R26.81);Other abnormalities of gait and mobility (R26.89);Muscle weakness (generalized) (M62.81);History of falling (Z91.81)                Time: 1012-1050 OT Time Calculation (min): 38 min Charges:  OT General Charges $OT Visit: 1 Visit OT Evaluation $OT Eval Moderate Complexity: 1 Mod  Katie Jackson  Axel Filler, OTR/L Acute Rehabilitation Services Office [phone] Secure Chat Communication Preferred   Katie Jackson 11/06/2023, 11:20 AM

## 2023-11-06 NOTE — Progress Notes (Addendum)
[facility] PROGRESS NOTE    Progress Note  Katie Jackson  RUE:[id number] DOB: 1946/10/30 DOA: 11/04/2023 PCP: Georgann Housekeeper, MD     Brief Narrative:   Katie Jackson is an 77 y.o. female past medical history significant for Alzheimer's dementia behavioral disturbance essential hypertension COPD brought into the ED after fall at home with the head showed left-sided subdural hematoma 9 mm size and subarachnoid hemorrhage no midline shift.  ED spoke with neurosurgery who recommended to repeat a CT scan that showed enlarging subdural hematoma.   Assessment/Plan:   Traumatic left-sided SDH (subdural hematoma) and subarachnoid hemorrhage: Repeated CT scan showing enlarging subdural hematoma repeated CT scan 11/05/2023 shows stabilized. Neurosurgery was involved who recommended to start him on Keppra twice a day. Also recommend to try to keep systolic blood pressure less than 150. Be seen by CT once cleared by neurosurgery. Awaiting neurosurgery further recommendations. PT to evaluate once cleared by neurosurgery.  New acute metabolic encephalopathy: Likely due to polypharmacy discontinue Ativan and melatonin. Discontinue Ativan and melatonin and Seroquel as needed at night. Patient is overly sedated.  Dementia with behavioral disturbances: Continue Seroquel at night. Now in mittens.  Central hypertension: Continue Norvasc 10 try to keep systolic blood pressure less than 150.  Chronic kidney disease stage IIIa: His creatinine has remained at baseline.    DVT prophylaxis: scd Family Communication:daughter Status is: Inpatient Remains inpatient appropriate because: Traumatic subdural hematoma    Code Status:     Code Status Orders  (From admission, onward)           Start     Ordered   11/04/23 2110  Full code  Continuous       Question:  By:  Answer:  Consent: discussion documented in EHR   11/04/23 2110           Code Status History      Date Active Date Inactive Code Status Order ID Comments User Context   06/12/2023 0016 06/16/2023 2132 Full Code [id number]  Charlsie Quest, MD ED   04/22/2023 0552 04/27/2023 2338 Full Code [id number]  Gery Pray, MD Inpatient   02/26/2020 0408 03/04/2020 0154 Full Code [id number]  Barnetta Chapel, MD ED         IV Access:   Peripheral IV   Procedures and diagnostic studies:   CT HEAD WO CONTRAST ( )  Result Date: 11/05/2023 CLINICAL DATA:  Follow-up subdural hematoma EXAM: CT HEAD WITHOUT CONTRAST TECHNIQUE: Contiguous axial images were obtained from the base of the skull through the vertex without intravenous contrast. RADIATION DOSE REDUCTION: This exam was performed according to the departmental dose-optimization program which includes automated exposure control, adjustment of the mA and/or kV according to patient size and/or use of iterative reconstruction technique. COMPARISON:  Earlier the same day at 12:06 FINDINGS: Brain: Subdural hematoma along the left cerebral convexity shows no interval enlargement, maximal thickness on coronal reformats measures 12 mm. Local subarachnoid hemorrhage along the sylvian fissure. No significant mass effect given underlying brain atrophy. No evidence of infarct, hydrocephalus, or mass lesion. Brain atrophy Vascular: No hyperdense vessel or unexpected calcification. Skull: Normal. Negative for fracture or focal lesion. Sinuses/Orbits: No acute finding. IMPRESSION: Unchanged subdural hematoma on the left with local subarachnoid hemorrhage. Maximal subdural thickness is 12 mm but mass effect is mild in the setting of brain atrophy. No interval progression. Electronically Signed   By: Tiburcio Pea M.D.   On: 11/05/2023 06:23   CT HEAD WO CONTRAST ( )  Result Date: 11/05/2023 CLINICAL DATA:  Subdural hematoma EXAM: CT HEAD WITHOUT CONTRAST TECHNIQUE: Contiguous axial images were obtained from the base of the skull through the vertex without  intravenous contrast. RADIATION DOSE REDUCTION: This exam was performed according to the departmental dose-optimization program which includes automated exposure control, adjustment of the mA and/or kV according to patient size and/or use of iterative reconstruction technique. COMPARISON:  11/04/2023 FINDINGS: Brain: Enlarging acute left subdural hematoma, measuring up to 12 mm in thickness compared to 9 mm previously. Subarachnoid blood again noted in the sylvian fissure and adjacent sulci, stable. No midline shift. No acute infarction or hydrocephalus. Vascular: No hyperdense vessel or unexpected calcification. Skull: No acute calvarial abnormality. Sinuses/Orbits: No acute findings Other: Large left parietooccipital scalp hematoma. This is unchanged. IMPRESSION: Stable left subarachnoid hemorrhage. Enlarging left subdural hematoma, now 12 mm in thickness. No midline shift. Electronically Signed   By: Charlett Nose M.D.   On: 11/05/2023 00:18   CT Head Wo Contrast  Result Date: 11/04/2023 CLINICAL DATA:  Head trauma, intracranial arterial injury suspected; Neck trauma (Age >= 65y). Fall. EXAM: CT HEAD WITHOUT CONTRAST CT CERVICAL SPINE WITHOUT CONTRAST TECHNIQUE: Multidetector CT imaging of the head and cervical spine was performed following the standard protocol without intravenous contrast. Multiplanar CT image reconstructions of the cervical spine were also generated. RADIATION DOSE REDUCTION: This exam was performed according to the departmental dose-optimization program which includes automated exposure control, adjustment of the mA and/or kV according to patient size and/or use of iterative reconstruction technique. COMPARISON:  Head CT 06/11/2023 FINDINGS: CT HEAD FINDINGS Brain: Small volume acute subarachnoid hemorrhage is present in the left sylvian fissure and adjacent sulci. A small acute subdural hematoma over the left cerebral convexity measures up to 9 mm in thickness without midline shift or  other significant mass effect. No parenchymal hemorrhage or acute infarct is evident. There is mild cerebral atrophy. Hypodensities in the cerebral white matter are similar to the prior study and are nonspecific but compatible with mild chronic small vessel ischemic disease. Vascular: Calcified atherosclerosis at the skull base. Skull: No acute fracture or suspicious osseous lesion. Sinuses/Orbits: Paranasal sinuses and mastoid air cells are clear. Bilateral cataract extraction. Other: Large left parieto-occipital scalp hematoma. CT CERVICAL SPINE FINDINGS Alignment: Minimal anterolisthesis of C3 on C4, likely degenerative. Skull base and vertebrae: No acute fracture or destructive lesion. Diffuse osteopenia. Subcentimeter sclerotic focus in the C7 vertebral body, likely a bone island. Soft tissues and spinal canal: No prevertebral fluid or swelling. No visible canal hematoma. Disc levels: Mild-to-moderate multilevel disc degeneration. Advanced facet arthrosis at C2-3 and C3-4. Moderate bilateral neural foraminal stenosis at C5-6 and C6-7. No evidence of high-grade spinal stenosis. Upper chest: Scarring in the lung apices. Other: Heavy atherosclerotic calcification of the carotid bifurcations. Critical Value/emergent results were called by telephone at the time of interpretation on 11/04/2023 at 6:13 pm to Dr. Bethann Berkshire, who verbally acknowledged these results. IMPRESSION: 1. Acute left-sided subdural hematoma and subarachnoid hemorrhage. No midline shift. 2. No acute cervical spine fracture. Electronically Signed   By: Sebastian Ache M.D.   On: 11/04/2023 18:14   CT Cervical Spine Wo Contrast  Result Date: 11/04/2023 CLINICAL DATA:  Head trauma, intracranial arterial injury suspected; Neck trauma (Age >= 65y). Fall. EXAM: CT HEAD WITHOUT CONTRAST CT CERVICAL SPINE WITHOUT CONTRAST TECHNIQUE: Multidetector CT imaging of the head and cervical spine was performed following the standard protocol without  intravenous contrast. Multiplanar CT image reconstructions of the cervical spine  were also generated. RADIATION DOSE REDUCTION: This exam was performed according to the departmental dose-optimization program which includes automated exposure control, adjustment of the mA and/or kV according to patient size and/or use of iterative reconstruction technique. COMPARISON:  Head CT 06/11/2023 FINDINGS: CT HEAD FINDINGS Brain: Small volume acute subarachnoid hemorrhage is present in the left sylvian fissure and adjacent sulci. A small acute subdural hematoma over the left cerebral convexity measures up to 9 mm in thickness without midline shift or other significant mass effect. No parenchymal hemorrhage or acute infarct is evident. There is mild cerebral atrophy. Hypodensities in the cerebral white matter are similar to the prior study and are nonspecific but compatible with mild chronic small vessel ischemic disease. Vascular: Calcified atherosclerosis at the skull base. Skull: No acute fracture or suspicious osseous lesion. Sinuses/Orbits: Paranasal sinuses and mastoid air cells are clear. Bilateral cataract extraction. Other: Large left parieto-occipital scalp hematoma. CT CERVICAL SPINE FINDINGS Alignment: Minimal anterolisthesis of C3 on C4, likely degenerative. Skull base and vertebrae: No acute fracture or destructive lesion. Diffuse osteopenia. Subcentimeter sclerotic focus in the C7 vertebral body, likely a bone island. Soft tissues and spinal canal: No prevertebral fluid or swelling. No visible canal hematoma. Disc levels: Mild-to-moderate multilevel disc degeneration. Advanced facet arthrosis at C2-3 and C3-4. Moderate bilateral neural foraminal stenosis at C5-6 and C6-7. No evidence of high-grade spinal stenosis. Upper chest: Scarring in the lung apices. Other: Heavy atherosclerotic calcification of the carotid bifurcations. Critical Value/emergent results were called by telephone at the time of interpretation  on 11/04/2023 at 6:13 pm to Dr. Bethann Berkshire, who verbally acknowledged these results. IMPRESSION: 1. Acute left-sided subdural hematoma and subarachnoid hemorrhage. No midline shift. 2. No acute cervical spine fracture. Electronically Signed   By: Sebastian Ache M.D.   On: 11/04/2023 18:14     Medical Consultants:   None.   Subjective:    Katie Jackson nonverbal this morning but able to moan and groan  Objective:    Vitals:   11/05/23 2035 11/06/23 0051 11/06/23 0352 11/06/23 0823  BP: (!) 158/78 (!) 109/48 (!) 108/46 (!) 113/42  Pulse: 97 85 85 80  Resp: 16 20 20 16   Temp: 98.2 F (36.8 C) 98.7 F (37.1 C) 98.4 F (36.9 C) 98.7 F (37.1 C)  TempSrc: Oral Oral Oral Oral  SpO2: 97% 94% 93% 94%  Weight:      Height:       SpO2: 94 %   Intake/Output Summary (Last 24 hours) at 11/06/2023 1142 Last data filed at 11/05/2023 1500 Gross per 24 hour  Intake 965 ml  Output --  Net 965 ml   Filed Weights   11/04/23 1801  Weight: 59.9 kg    Exam: General exam: In no acute distress. Respiratory system: Good air movement and clear to auscultation. Cardiovascular system: S1 & S2 heard, RRR. No JVD. Gastrointestinal system: Abdomen is nondistended, soft and nontender.  Central nervous system: Moving all 4 extremities to painful stimuli. Extremities: No pedal edema. Skin: No rashes, lesions or ulcers Psychiatry: No judgment or insight in medical condition   Data Reviewed:    Labs: Basic Metabolic Panel: Recent Labs  Lab 11/04/23 1915 11/05/23 0216  NA 137 137  K 3.7 3.8  CL 104 107  CO2 23 23  GLUCOSE 106* 104*  BUN 12 10  CREATININE 1.06* 1.00  CALCIUM 9.3 9.0  MG  --  2.1   GFR Estimated Creatinine Clearance: 37.7 mL/min (by C-G formula based on  SCr of 1 mg/dL). Liver Function Tests: No results for input(s): "AST", "ALT", "ALKPHOS", "BILITOT", "PROT", "ALBUMIN" in the last 168 hours. No results for input(s): "LIPASE", "AMYLASE" in the last 168  hours. No results for input(s): "AMMONIA" in the last 168 hours. Coagulation profile Recent Labs  Lab 11/05/23 0216  INR 1.1   COVID-19 Labs  No results for input(s): "DDIMER", "FERRITIN", "LDH", "CRP" in the last 72 hours.  Lab Results  Component Value Date   SARSCOV2NAA NEGATIVE 04/21/2023   SARSCOV2NAA NEGATIVE 02/25/2020    CBC: Recent Labs  Lab 11/04/23 1915 11/05/23 0216  WBC 10.2 10.8*  NEUTROABS 7.0  --   HGB 11.9* 11.1*  HCT 38.0 35.8*  MCV 78.0* 78.3*  PLT 289 264   Cardiac Enzymes: No results for input(s): "CKTOTAL", "CKMB", "CKMBINDEX", "TROPONINI" in the last 168 hours. BNP (last 3 results) No results for input(s): "PROBNP" in the last 8760 hours. CBG: No results for input(s): "GLUCAP" in the last 168 hours. D-Dimer: No results for input(s): "DDIMER" in the last 72 hours. Hgb A1c: No results for input(s): "HGBA1C" in the last 72 hours. Lipid Profile: No results for input(s): "CHOL", "HDL", "LDLCALC", "TRIG", "CHOLHDL", "LDLDIRECT" in the last 72 hours. Thyroid function studies: No results for input(s): "TSH", "T4TOTAL", "T3FREE", "THYROIDAB" in the last 72 hours.  Invalid input(s): "FREET3" Anemia work up: No results for input(s): "VITAMINB12", "FOLATE", "FERRITIN", "TIBC", "IRON", "RETICCTPCT" in the last 72 hours. Sepsis Labs: Recent Labs  Lab 11/04/23 1915 11/05/23 0216  WBC 10.2 10.8*   Microbiology No results found for this or any previous visit (from the past 240 hour(s)).   Medications:    melatonin  3 mg Oral QHS   mouth rinse  15 mL Mouth Rinse 4 times per day   QUEtiapine  50 mg Oral QHS   Continuous Infusions:  levETIRAcetam 500 mg (11/06/23 0810)      LOS: 2 days   Marinda Elk  [facility]  11/06/2023, 11:42 AM

## 2023-11-06 NOTE — Progress Notes (Signed)
Speech Language Pathology Treatment:    Patient Details Name: Katie Jackson MRN: [id number] DOB: 09-04-1946 Today's Date: 11/06/2023 Time:  -     Pt lethargic and unable to arouse however daughter at bedside and provided lots of insight into her cognitive and communicative abilities prior to hospitalization. At baseline daughter reports she is total care, not oriented and verbal output consisted of 2 words and has difficulty comprehending what is said to her. She did say she is trying to speak a little more now that she is off her meds for memory. Daughter in agreement that full assessment does not appear to be beneficial at this time and that her ability to make progress, given baseline is very limited. At this time ST will sign off and daughter in agreement. Provided daughter with several strategies she can try with pt in terms of continuing to speak with her and comment on what is in her environment.                          Royce Macadamia  11/06/2023, 2:14 PM

## 2023-11-07 DIAGNOSIS — S065XAA Traumatic subdural hemorrhage with loss of consciousness status unknown, initial encounter: Secondary | ICD-10-CM | POA: Diagnosis not present

## 2023-11-07 LAB — URINALYSIS, W/ REFLEX TO CULTURE (INFECTION SUSPECTED)
Bilirubin Urine: NEGATIVE
Glucose, UA: NEGATIVE mg/dL
Ketones, ur: NEGATIVE mg/dL
Nitrite: POSITIVE — AB
Protein, ur: NEGATIVE mg/dL
Specific Gravity, Urine: 1.01 (ref 1.005–1.030)
pH: 5 (ref 5.0–8.0)

## 2023-11-07 MED ORDER — ORAL CARE MOUTH RINSE
15.0000 mL | OROMUCOSAL | Status: DC
  Administered 2023-11-07 – 2023-11-16 (×34): 15 mL via OROMUCOSAL

## 2023-11-07 MED ORDER — HALOPERIDOL LACTATE 5 MG/ML IJ SOLN: 1.0000 mg | Freq: Four times a day (QID) | INTRAMUSCULAR | Status: DC | PRN

## 2023-11-07 MED ORDER — ORAL CARE MOUTH RINSE: 15.0000 mL | OROMUCOSAL | Status: DC | PRN

## 2023-11-07 NOTE — Progress Notes (Signed)
Transition of Care [facility]) - CAGE-AID Screening   Patient Details  Name: Katie Jackson MRN: [id number] Date of Birth: 1946-02-21  Transition of Care [facility]) CM/SW Contact:    Leota Sauers, RN Phone Number: 11/07/2023, 6:02 AM   Clinical Narrative:  Patient denies use of alcohol and illicit substances. Resources not given at this time.   CAGE-AID Screening:    Have You Ever Felt You Ought to Cut Down on Your Drinking or Drug Use?: No Have People Annoyed You By Critizing Your Drinking Or Drug Use?: No Have You Felt Bad Or Guilty About Your Drinking Or Drug Use?: No Have You Ever Had a Drink or Used Drugs First Thing In The Morning to Steady Your Nerves or to Get Rid of a Hangover?: No CAGE-AID Score: 0  Substance Abuse Education Offered: No

## 2023-11-07 NOTE — TOC Progression Note (Signed)
Transition of Care [facility]) - Progression Note    Patient Details  Name: Katie Jackson MRN: [id number] Date of Birth: 08-22-1946  Transition of Care [facility]) CM/SW Contact  Kermit Balo, RN Phone Number: 11/07/2023, 2:30 PM  Clinical Narrative:     CM received information from [employer] that pts medicaid does not cover caregivers. Her medicaid only covers her medicare premiums. CM has updated the patient's daughter and encouraged her to talk with DSS to get her medicaid expanded.  TOC following.  Expected Discharge Plan: Home w Home Health Services Barriers to Discharge: Continued Medical Work up  Expected Discharge Plan and Services   Discharge Planning Services: CM Consult Post Acute Care Choice: Home Health Living arrangements for the past 2 months: Apartment                           HH Arranged: PT, OT, Social Work, Advertising account executive HH Agency: [facility] Date [facility] Agency Contacted: 11/06/23   Representative spoke with at [facility] Agency: Tresa Endo   Social Determinants of Health (SDOH) Interventions SDOH Screenings   Food Insecurity: Patient Unable To Answer (11/04/2023)  Housing: High Risk (11/04/2023)  Transportation Needs: Patient Unable To Answer (11/04/2023)  Utilities: Patient Unable To Answer (11/04/2023)  Tobacco Use: Medium Risk (11/04/2023)    Readmission Risk Interventions    06/16/2023    9:49 AM 04/24/2023    4:40 PM  Readmission Risk Prevention Plan  Post Dischage Appt  Complete  Medication Screening  Complete  Transportation Screening Complete Complete  PCP or Specialist Appt within 5-7 Days Complete   Home Care Screening Complete   Medication Review (RN CM) Complete

## 2023-11-07 NOTE — Progress Notes (Signed)
Physical Therapy Treatment Patient Details Name: Katie Jackson MRN: [id number] DOB: 08/01/1946 Today's Date: 11/07/2023   History of Present Illness Katie Jackson is a 77 yo female who presented after a fall backwards off of steps.  CT head showed subdural hematoma enlarging from 9 mm to 12 mm. PMHx: Alzheimer's dementia with behavioral symptoms, HTN, HLD, CKD, COPD    PT Comments  Pt lethargic on arrival, opening eyes when prompted however unable to remain alert. Initiate sidelying>sit with pt assisting to elevate trunk and requiring max A to complete. Once up EOB pt awake and alert with eyes open, tapping feet to music and able to maintain sitting balance with CGA-supervision for safety for ~12 mins, pt remaining seated up EOB at end of session. Pt able to follow short simple commands for LE exercise however unable to follow verbal, tactile or gesutral cues to transfer to stand despite total A for attempt. Pt accepting spoonfuls of soup and pudding during session and RN in at end of session as pt requiring full supervision for meals. Current plan remains appropriate to address deficits and maximize functional independence and decrease caregiver burden. Pt continues to benefit from skilled PT services to progress toward functional mobility goals.     If plan is discharge home, recommend the following: A lot of help with walking and/or transfers;A lot of help with bathing/dressing/bathroom;Assistance with cooking/housework;Direct supervision/assist for medications management;Direct supervision/assist for financial management;Assist for transportation;Help with stairs or ramp for entrance;Supervision due to cognitive status   Can travel by private vehicle        Equipment Recommendations  None recommended by PT    Recommendations for Other Services       Precautions / Restrictions Precautions Precautions: Fall Precaution Comments: reason for hospitalization, fall 6 months  ago Restrictions Weight Bearing Restrictions: No     Mobility  Bed Mobility Overal bed mobility: Needs Assistance Bed Mobility: Sidelying to Sit   Sidelying to sit: Max assist       General bed mobility comments: pt sidelying on arrival, max A to bring LEs to and off EOB and eleavte trunk, once initiate pt assisting    Transfers                   General transfer comment: pt unable to follow, verbal, tactile or gestures to attempt standing    Ambulation/Gait                   Stairs             Wheelchair Mobility     Tilt Bed    Modified Rankin (Stroke Patients Only)       Balance Overall balance assessment: Needs assistance Sitting-balance support: Feet unsupported, Single extremity supported, Bilateral upper extremity supported, No upper extremity supported Sitting balance-Leahy Scale: Fair Sitting balance - Comments: able to maintain with supervision for safety                                    Cognition Arousal: Lethargic, Alert Behavior During Therapy: Flat affect Overall Cognitive Status: History of cognitive impairments - at baseline                                 General Comments: pt with eyes closed on arrival, opening brifely when prompted, once seated up EOB pt  awake, kicking feet able to maintain upright sitting, humming to music and accepting bites of soup and pudding, unable to follow commands for mobility        Exercises Other Exercises Other Exercises: LAQ with BLEs at same time x10    General Comments General comments (skin integrity, edema, etc.): VSS, RN in at end of session to continue feeding as pt alert seated up EOB and accepting      Pertinent Vitals/Pain Pain Assessment Pain Assessment: Faces Faces Pain Scale: No hurt Pain Intervention(s): Monitored during session    Home Living                          Prior Function            PT Goals (current goals  can now be found in the care plan section) Acute Rehab PT Goals PT Goal Formulation: With patient/family Time For Goal Achievement: 11/20/23 Progress towards PT goals: Progressing toward goals    Frequency    Min 1X/week      PT Plan      Co-evaluation              AM-PAC PT "6 Clicks" Mobility   Outcome Measure  Help needed turning from your back to your side while in a flat bed without using bedrails?: Total Help needed moving from lying on your back to sitting on the side of a flat bed without using bedrails?: Total Help needed moving to and from a bed to a chair (including a wheelchair)?: Total Help needed standing up from a chair using your arms (e.g., wheelchair or bedside chair)?: Total Help needed to walk in hospital room?: Total Help needed climbing 3-5 steps with a railing? : Total 6 Click Score: 6    End of Session   Activity Tolerance: Patient limited by lethargy Patient left: in bed;with call bell/phone within reach;Other (comment);with bed alarm set;with nursing/sitter in room (seated up EOB with RN present for feeding) Nurse Communication: Other (comment) (RN in room) PT Visit Diagnosis: Muscle weakness (generalized) (M62.81);Repeated falls (R29.6);Other symptoms and signs involving the nervous system (R29.898)     Time: 8182-9937 PT Time Calculation (min) (ACUTE ONLY): 14 min  Charges:    $Therapeutic Activity: 8-22 mins PT General Charges $$ ACUTE PT VISIT: 1 Visit                     Yvonnie Schinke R. PTA Acute Rehabilitation Services Office: [phone]   Catalina Antigua 11/07/2023, 3:52 PM

## 2023-11-07 NOTE — Evaluation (Signed)
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 2   Chronic kidney disease    stage 3   COPD (chronic obstructive pulmonary disease) (HCC)    Daughter denies this dx as of 02/22/23   High cholesterol    Hypertension    Past Surgical History:  Past Surgical History:  Procedure Laterality Date   ABDOMINAL HYSTERECTOMY     BREAST BIOPSY     BUNIONECTOMY Left    cataract Bilateral    MULTIPLE TOOTH EXTRACTIONS     RADIOLOGY WITH ANESTHESIA N/A 04/13/2023   Procedure: MRI WITH ANESTHESIA OF PELVIS WITH AND WITHOUT CONTRAST;  Surgeon: Radiologist, Medication, MD;  Location: [facility] OR;  Service: Radiology;  Laterality: N/A;   HPI:  Katie Jackson is a 77 yo female presenting to ED 11/16 after falling backwards off of steps. CTH with SAH and L SDH enlarging from 9 mm to 12 mm. Most recently seen by SLP 04/2023 with cognitively based dysphagia and was recommended a Dys 1 diet prior to being upgraded to regular textures as lethargy subsided. PMH includes Alzheimer's dementia with behavioral symptoms, HTN, HLD, CKD, COPD    Assessment / Plan / Recommendation  Clinical Impression  Pt's daughter reports that pt has a history of oral holding and pocketing likely secondary to advanced dementia, although still consumes a regular texture diet. Pt is quite lethargic this date, thought to be caused by medication with pt rousing for short periods of time given Max cueing. Pt intermittently able to follow commands to complete oral motor exam, which  appears grossly functional. Observed pt with trials of ice chips, thin liquids, and Dys 2 texture solids without signs clinically concerning for aspiration. With solids and ice chips, pt presents with oral holding and required cueing to masticate and swallow. At this time, do not feel that pt's lethargy allows for a solid diet. Recommend downgrading to full liquids to be offered only when pt is fully awake, alert, and accepting. Meds can be given crushed in puree. SLP will f/u to assess ability to upgrade to solids as clinically indicated. SLP Visit Diagnosis: Dysphagia, unspecified (R13.10)    Aspiration Risk  Mild aspiration risk    Diet Recommendation Thin liquid    Liquid Administration via: Cup;Straw;Spoon Medication Administration: Crushed with puree Supervision: Staff to assist with self feeding;Full supervision/cueing for compensatory strategies Compensations: Minimize environmental distractions;Slow rate;Small sips/bites Postural Changes: Seated upright at 90 degrees    Other  Recommendations Oral Care Recommendations: Oral care QID;Staff/trained caregiver to provide oral care    Recommendations for follow up therapy are one component of a multi-disciplinary discharge planning process, led by the attending physician.  Recommendations may be updated based on patient status, additional functional criteria and insurance authorization.  Follow up Recommendations Home health SLP      Assistance Recommended at Discharge    Functional Status Assessment Patient has had a recent decline in their functional status and demonstrates the ability to make significant improvements in function in a reasonable and predictable  amount of time.  Frequency and Duration min 2x/week  1 week       Prognosis Prognosis for improved oropharyngeal function: Good Barriers to Reach Goals: Cognitive deficits;Language deficits;Time post onset;Severity of deficits      Swallow Study   General HPI: Katie Jackson is a 77 yo female presenting to ED 11/16 after falling backwards off of steps. CTH with SAH and L SDH enlarging from 9 mm to 12 mm. Most recently seen by SLP 04/2023 with cognitively based dysphagia and was recommended a Dys 1 diet prior to being upgraded to regular textures as lethargy subsided. PMH includes Alzheimer's dementia with behavioral symptoms, HTN, HLD, CKD, COPD Type of Study: Bedside Swallow Evaluation Previous Swallow Assessment: see HPI Diet Prior to this Study: Regular;Thin liquids (Level 0) Temperature Spikes Noted: No Respiratory Status: Room air History of Recent Intubation: No Behavior/Cognition: Lethargic/Drowsy;Requires cueing Oral Cavity Assessment: Within Functional Limits Oral Care Completed by SLP: No Oral Cavity - Dentition: Dentures, top;Dentures, bottom;Edentulous Vision: Functional for self-feeding Self-Feeding Abilities: Total assist Patient Positioning: Upright in bed Baseline Vocal Quality: Normal Volitional Cough: Cognitively unable to elicit Volitional Swallow: Able to elicit    Oral/Motor/Sensory Function Overall Oral Motor/Sensory Function: Within functional limits   Ice Chips Ice chips: Within functional limits Presentation: Spoon   Thin Liquid Thin Liquid: Within functional limits Presentation: Straw    Nectar Thick Nectar Thick Liquid: Not tested   Honey Thick Honey Thick Liquid: Not tested   Puree Puree: Not tested   Solid     Solid: Impaired Presentation: Spoon Oral Phase Impairments: Impaired mastication;Poor awareness of bolus Oral Phase Functional Implications: Oral holding      Gwynneth Aliment, M.A., CF-SLP Speech Language Pathology, Acute Rehabilitation Services  Secure Chat preferred [phone]  11/07/2023,11:31 AM

## 2023-11-07 NOTE — Care Management Important Message (Signed)
Important Message  Patient Details  Name: Katie Jackson MRN: [id number] Date of Birth: 09-04-1946   Important Message Given:  Yes - Medicare IM     Dorena Bodo 11/07/2023, 1:35 PM

## 2023-11-07 NOTE — Progress Notes (Signed)
[facility] PROGRESS NOTE    Progress Note  Katie Jackson  ZOX:[id number] DOB: 08/16/1946 DOA: 11/04/2023 PCP: Georgann Housekeeper, MD     Brief Narrative:   Katie Jackson is an 77 y.o. female past medical history significant for Alzheimer's dementia behavioral disturbance essential hypertension COPD brought into the ED after fall at home with the head showed left-sided subdural hematoma 9 mm size and subarachnoid hemorrhage no midline shift.  ED spoke with neurosurgery who recommended to repeat a CT scan that showed enlarging subdural hematoma.   Assessment/Plan:   Traumatic left-sided SDH (subdural hematoma) and subarachnoid hemorrhage: Repeated CT scan showing enlarging subdural hematoma repeated CT scan 11/05/2023 shows stabilized. Neurosurgery was involved who recommended to start him on Keppra twice a day. Also recommend to try to keep systolic blood pressure less than 150. Neurosurgery recommended no intervention and repeat a CT scan in 2 weeks and follow-up with the clinic thereafter.  She was at risk of chronic subdural hematoma. The recommended to continue Keppra for 7 days. PT OT evaluation recommended home health PT.  New acute metabolic encephalopathy: Likely due to polypharmacy discontinue Ativan and melatonin. Will have to discontinue Seroquel use Haldol IV as needed. She was waking up yesterday in the afternoon as per daughter. Will have to reevaluate her by PT when she is more awake.  Dementia with behavioral disturbances: Discontinue Seroquel. Now in mittens.  Essential hypertension: Continue metoprolol goal blood pressure less than 150.  Chronic kidney disease stage IIIa: His creatinine has remained at baseline.    DVT prophylaxis: scd Family Communication:daughter Status is: Inpatient Remains inpatient appropriate because: Traumatic subdural hematoma    Code Status:     Code Status Orders  (From admission, onward)            Start     Ordered   11/04/23 2110  Full code  Continuous       Question:  By:  Answer:  Consent: discussion documented in EHR   11/04/23 2110           Code Status History     Date Active Date Inactive Code Status Order ID Comments User Context   06/12/2023 0016 06/16/2023 2132 Full Code [id number]  Charlsie Quest, MD ED   04/22/2023 0552 04/27/2023 2338 Full Code [id number]  Gery Pray, MD Inpatient   02/26/2020 0408 03/04/2020 0154 Full Code [id number]  Barnetta Chapel, MD ED         IV Access:   Peripheral IV   Procedures and diagnostic studies:   No results found.   Medical Consultants:   None.   Subjective:    Katie Jackson more awake today able to moan and groan yes and no when asked questions Objective:    Vitals:   11/06/23 2020 11/06/23 2353 11/07/23 0347 11/07/23 0834  BP: (!) 144/61 105/80 133/61 (!) 135/54  Pulse: 97 89 90 91  Resp: 18 17 18 15   Temp: 98.3 F (36.8 C) 98.6 F (37 C) 97.8 F (36.6 C) 98.3 F (36.8 C)  TempSrc: Oral Oral Oral Axillary  SpO2: 95% 96% 96% 96%  Weight:      Height:       SpO2: 96 %   Intake/Output Summary (Last 24 hours) at 11/07/2023 0933 Last data filed at 11/07/2023 0421 Gross per 24 hour  Intake 320 ml  Output 450 ml  Net -130 ml   Filed Weights   11/04/23 1801  Weight: 59.9 kg  Exam: General exam: In no acute distress. Respiratory system: Good air movement and clear to auscultation. Cardiovascular system: S1 & S2 heard, RRR. No JVD. Gastrointestinal system: Abdomen is nondistended, soft and nontender.  Extremities: No pedal edema. Skin: No rashes, lesions or ulcers Psychiatry: No insight in medical condition. Data Reviewed:    Labs: Basic Metabolic Panel: Recent Labs  Lab 11/04/23 1915 11/05/23 0216  NA 137 137  K 3.7 3.8  CL 104 107  CO2 23 23  GLUCOSE 106* 104*  BUN 12 10  CREATININE 1.06* 1.00  CALCIUM 9.3 9.0  MG  --  2.1   GFR Estimated Creatinine Clearance:  37.7 mL/min (by C-G formula based on SCr of 1 mg/dL). Liver Function Tests: No results for input(s): "AST", "ALT", "ALKPHOS", "BILITOT", "PROT", "ALBUMIN" in the last 168 hours. No results for input(s): "LIPASE", "AMYLASE" in the last 168 hours. No results for input(s): "AMMONIA" in the last 168 hours. Coagulation profile Recent Labs  Lab 11/05/23 0216  INR 1.1   COVID-19 Labs  No results for input(s): "DDIMER", "FERRITIN", "LDH", "CRP" in the last 72 hours.  Lab Results  Component Value Date   SARSCOV2NAA NEGATIVE 04/21/2023   SARSCOV2NAA NEGATIVE 02/25/2020    CBC: Recent Labs  Lab 11/04/23 1915 11/05/23 0216  WBC 10.2 10.8*  NEUTROABS 7.0  --   HGB 11.9* 11.1*  HCT 38.0 35.8*  MCV 78.0* 78.3*  PLT 289 264   Cardiac Enzymes: No results for input(s): "CKTOTAL", "CKMB", "CKMBINDEX", "TROPONINI" in the last 168 hours. BNP (last 3 results) No results for input(s): "PROBNP" in the last 8760 hours. CBG: No results for input(s): "GLUCAP" in the last 168 hours. D-Dimer: No results for input(s): "DDIMER" in the last 72 hours. Hgb A1c: No results for input(s): "HGBA1C" in the last 72 hours. Lipid Profile: No results for input(s): "CHOL", "HDL", "LDLCALC", "TRIG", "CHOLHDL", "LDLDIRECT" in the last 72 hours. Thyroid function studies: No results for input(s): "TSH", "T4TOTAL", "T3FREE", "THYROIDAB" in the last 72 hours.  Invalid input(s): "FREET3" Anemia work up: No results for input(s): "VITAMINB12", "FOLATE", "FERRITIN", "TIBC", "IRON", "RETICCTPCT" in the last 72 hours. Sepsis Labs: Recent Labs  Lab 11/04/23 1915 11/05/23 0216  WBC 10.2 10.8*   Microbiology No results found for this or any previous visit (from the past 240 hour(s)).   Medications:    mouth rinse  15 mL Mouth Rinse 4 times per day   QUEtiapine  50 mg Oral QHS   Continuous Infusions:  levETIRAcetam 500 mg (11/07/23 0911)      LOS: 3 days   Marinda Elk  [facility]  11/07/2023, 9:33 AM

## 2023-11-08 DIAGNOSIS — S065XAA Traumatic subdural hemorrhage with loss of consciousness status unknown, initial encounter: Secondary | ICD-10-CM | POA: Diagnosis not present

## 2023-11-08 NOTE — Plan of Care (Signed)
  Problem: Nutrition: Goal: Adequate nutrition will be maintained Outcome: Not Progressing   Problem: Coping: Goal: Level of anxiety will decrease Outcome: Progressing   Problem: Pain Management: Goal: General experience of comfort will improve Outcome: Progressing

## 2023-11-08 NOTE — Progress Notes (Signed)
Speech Language Pathology Treatment: Dysphagia  Patient Details Name: Katie Jackson MRN: [id number] DOB: July 25, 1946 Today's Date: 11/08/2023 Time: 9528-4132 SLP Time Calculation (min) (ACUTE ONLY): 8 min  Assessment / Plan / Recommendation Clinical Impression  Pt significantly more alert this date, interacting with staff and sitting upright. Observed pt with trials of thin liquids, purees, and solids, which pt held in her oral cavity despite the texture. Her daughter reported previous date that oral holding is consistent with baseline and family is frequently present to cue pt to swallow or clear pocketing. Overall, pt presents without s/s of aspiration and oral dysphagia appears consistent with baseline performance which is suspected to be secondary to dementia. Pt has adequate family support to return to baseline diet of Dys 3 texture solids with thin liquids. No SLP f/u is necessary. Will s/o at this time.   HPI HPI: Katie Jackson is a 77 yo female presenting to ED 11/16 after falling backwards off of steps. CTH with SAH and L SDH enlarging from 9 mm to 12 mm. Most recently seen by SLP 04/2023 with cognitively based dysphagia and was recommended a Dys 1 diet prior to being upgraded to regular textures as lethargy subsided. PMH includes Alzheimer's dementia with behavioral symptoms, HTN, HLD, CKD, COPD      SLP Plan  All goals met      Recommendations for follow up therapy are one component of a multi-disciplinary discharge planning process, led by the attending physician.  Recommendations may be updated based on patient status, additional functional criteria and insurance authorization.    Recommendations  Diet recommendations: Dysphagia 3 (mechanical soft);Thin liquid Liquids provided via: Cup;Straw Medication Administration: Crushed with puree Supervision: Staff to assist with self feeding;Full supervision/cueing for compensatory strategies;Trained caregiver to feed  patient Compensations: Minimize environmental distractions;Slow rate;Small sips/bites;Lingual sweep for clearance of pocketing Postural Changes and/or Swallow Maneuvers: Seated upright 90 degrees                  Oral care BID;Staff/trained caregiver to provide oral care   Frequent or constant Supervision/Assistance Dysphagia, unspecified (R13.10)     All goals met     Gwynneth Aliment, M.A., CF-SLP Speech Language Pathology, Acute Rehabilitation Services  Secure Chat preferred [phone]   11/08/2023, 2:43 PM

## 2023-11-08 NOTE — Plan of Care (Signed)
  Problem: Safety: Goal: Non-violent Restraint(s) Outcome: Progressing   Problem: Education: Goal: Knowledge of General Education information will improve Description: Including pain rating scale, medication(s)/side effects and non-pharmacologic comfort measures Outcome: Progressing   Problem: Clinical Measurements: Goal: Ability to maintain clinical measurements within normal limits will improve Outcome: Progressing Goal: Will remain free from infection Outcome: Progressing Goal: Diagnostic test results will improve Outcome: Progressing

## 2023-11-08 NOTE — Progress Notes (Addendum)
[facility] PROGRESS NOTE    Progress Note  Katie Jackson  JXB:[id number] DOB: [birth year] DOA: 11/04/2023 PCP: Georgann Housekeeper, MD     Brief Narrative:   Katie Jackson is an 77 y.o. female past medical history significant for Alzheimer's dementia behavioral disturbance essential hypertension COPD brought into the ED after fall at home with the head showed left-sided subdural hematoma 9 mm size and subarachnoid hemorrhage no midline shift.  ED spoke with neurosurgery who recommended to repeat a CT scan that showed enlarging subdural hematoma.   Assessment/Plan:   Traumatic left-sided SDH (subdural hematoma) and subarachnoid hemorrhage: Repeated CT scan showing enlarging subdural hematoma repeated CT scan 11/05/2023 shows stabilized. Neurosurgery was involved who recommended to start him on Keppra twice a day x7. Also recommend to try to keep systolic blood pressure less than 150. Neurosurgery recommended no intervention and repeat a CT scan in 2 weeks and follow-up with the clinic thereafter.  She was at risk of chronic subdural hematoma. The recommended to continue Keppra for 7 days. PT OT evaluation recommended home health PT.  New acute metabolic encephalopathy: Likely due to polypharmacy discontinue how although, Seroquel Ativan and melatonin. She was waking up yesterday in the afternoon as per daughter. PT evaluate the patient recommended home health PT. The patient is slow to wake up and was speaking to the daughter and whenever they give her Seroquel at home he takes it about 48 hours to come out of it.  Dementia with behavioral disturbances: Discontinue Seroquel, avoid Haldol and Ativan. Now in mittens.  Essential hypertension: Continue metoprolol goal blood pressure less than 150.  Chronic kidney disease stage IIIa: His creatinine has remained at baseline.    DVT prophylaxis: scd Family Communication:daughter Status is: Inpatient Remains  inpatient appropriate because: Traumatic subdural hematoma    Code Status:     Code Status Orders  (From admission, onward)           Start     Ordered   11/04/23 2110  Full code  Continuous       Question:  By:  Answer:  Consent: discussion documented in EHR   11/04/23 2110           Code Status History     Date Active Date Inactive Code Status Order ID Comments User Context   06/12/2023 0016 06/16/2023 2132 Full Code [id number]  Charlsie Quest, MD ED   04/22/2023 0552 04/27/2023 2338 Full Code [id number]  Gery Pray, MD Inpatient   02/26/2020 0408 03/04/2020 0154 Full Code [id number]  Barnetta Chapel, MD ED         IV Access:   Peripheral IV   Procedures and diagnostic studies:   No results found.   Medical Consultants:   None.   Subjective:    Katie Jackson more awake today. Objective:    Vitals:   11/07/23 2152 11/07/23 2336 11/08/23 0352 11/08/23 0753  BP: (!) 164/61 (!) 145/61 (!) 136/59 (!) 123/52  Pulse:  86 82 81  Resp:  18 18 17   Temp:  99 F (37.2 C) 97.8 F (36.6 C) 98 F (36.7 C)  TempSrc:  Oral Oral Oral  SpO2:  98% 97% 97%  Weight:      Height:       SpO2: 97 %   Intake/Output Summary (Last 24 hours) at 11/08/2023 0914 Last data filed at 11/08/2023 0500 Gross per 24 hour  Intake 300 ml  Output 300 ml  Net 0 ml  Filed Weights   11/04/23 1801  Weight: 59.9 kg    Exam: General exam: In no acute distress able to open and close eyes upon command.  Still sleepy does not want to be bothered she did have breakfast this morning without any difficulties. Respiratory system: Good air movement and clear to auscultation. Cardiovascular system: S1 & S2 heard, RRR. No JVD. Gastrointestinal system: Abdomen is nondistended, soft and nontender.  Extremities: No pedal edema. Skin: No rashes, lesions or ulcers Psychiatry: No insight in medical condition. Data Reviewed:    Labs: Basic Metabolic Panel: Recent Labs   Lab 11/04/23 1915 11/05/23 0216  NA 137 137  K 3.7 3.8  CL 104 107  CO2 23 23  GLUCOSE 106* 104*  BUN 12 10  CREATININE 1.06* 1.00  CALCIUM 9.3 9.0  MG  --  2.1   GFR Estimated Creatinine Clearance: 37.7 mL/min (by C-G formula based on SCr of 1 mg/dL). Liver Function Tests: No results for input(s): "AST", "ALT", "ALKPHOS", "BILITOT", "PROT", "ALBUMIN" in the last 168 hours. No results for input(s): "LIPASE", "AMYLASE" in the last 168 hours. No results for input(s): "AMMONIA" in the last 168 hours. Coagulation profile Recent Labs  Lab 11/05/23 0216  INR 1.1   COVID-19 Labs  No results for input(s): "DDIMER", "FERRITIN", "LDH", "CRP" in the last 72 hours.  Lab Results  Component Value Date   SARSCOV2NAA NEGATIVE 04/21/2023   SARSCOV2NAA NEGATIVE 02/25/2020    CBC: Recent Labs  Lab 11/04/23 1915 11/05/23 0216  WBC 10.2 10.8*  NEUTROABS 7.0  --   HGB 11.9* 11.1*  HCT 38.0 35.8*  MCV 78.0* 78.3*  PLT 289 264   Cardiac Enzymes: No results for input(s): "CKTOTAL", "CKMB", "CKMBINDEX", "TROPONINI" in the last 168 hours. BNP (last 3 results) No results for input(s): "PROBNP" in the last 8760 hours. CBG: No results for input(s): "GLUCAP" in the last 168 hours. D-Dimer: No results for input(s): "DDIMER" in the last 72 hours. Hgb A1c: No results for input(s): "HGBA1C" in the last 72 hours. Lipid Profile: No results for input(s): "CHOL", "HDL", "LDLCALC", "TRIG", "CHOLHDL", "LDLDIRECT" in the last 72 hours. Thyroid function studies: No results for input(s): "TSH", "T4TOTAL", "T3FREE", "THYROIDAB" in the last 72 hours.  Invalid input(s): "FREET3" Anemia work up: No results for input(s): "VITAMINB12", "FOLATE", "FERRITIN", "TIBC", "IRON", "RETICCTPCT" in the last 72 hours. Sepsis Labs: Recent Labs  Lab 11/04/23 1915 11/05/23 0216  WBC 10.2 10.8*   Microbiology Recent Results (from the past 240 hour(s))  Urine Culture     Status: Abnormal (Preliminary  result)   Collection Time: 11/07/23  3:54 AM   Specimen: Urine, Random  Result Value Ref Range Status   Specimen Description URINE, RANDOM  Final   Special Requests NONE Reflexed from [id number]  Final   Culture (A)  Final    90,000 COLONIES/mL GRAM NEGATIVE RODS IDENTIFICATION TO FOLLOW Performed at [facility], [address], [place], [place] [address]    Report Status PENDING  Incomplete     Medications:    mouth rinse  15 mL Mouth Rinse 4 times per day   Continuous Infusions:  levETIRAcetam 500 mg (11/08/23 0840)      LOS: 4 days   Marinda Elk  [facility]  11/08/2023, 9:14 AM

## 2023-11-09 ENCOUNTER — Inpatient Hospital Stay (HOSPITAL_COMMUNITY): Payer: 59

## 2023-11-09 DIAGNOSIS — G9341 Metabolic encephalopathy: Secondary | ICD-10-CM | POA: Diagnosis not present

## 2023-11-09 DIAGNOSIS — S065XAA Traumatic subdural hemorrhage with loss of consciousness status unknown, initial encounter: Secondary | ICD-10-CM | POA: Diagnosis not present

## 2023-11-09 LAB — URINE CULTURE: Culture: 90000 — AB

## 2023-11-09 LAB — AMMONIA: Ammonia: 45 umol/L — ABNORMAL HIGH (ref 9–35)

## 2023-11-09 MED ORDER — LACTULOSE 10 GM/15ML PO SOLN
10.0000 g | Freq: Two times a day (BID) | ORAL | Status: DC
  Administered 2023-11-09 – 2023-11-16 (×15): 10 g via ORAL
  Filled 2023-11-09 (×16): qty 30

## 2023-11-09 MED ORDER — LEVETIRACETAM 250 MG PO TABS
250.0000 mg | ORAL_TABLET | Freq: Two times a day (BID) | ORAL | Status: AC
  Administered 2023-11-09 – 2023-11-10 (×4): 250 mg via ORAL
  Filled 2023-11-09 (×4): qty 1

## 2023-11-09 NOTE — Progress Notes (Signed)
[facility] PROGRESS NOTE    Progress Note  Katie Jackson  FAO:[id number] DOB: 12/05/1946 DOA: 11/04/2023 PCP: Georgann Housekeeper, MD     Brief Narrative:   Katie Jackson is an 77 y.o. female past medical history significant for Alzheimer's dementia behavioral disturbance essential hypertension COPD brought into the ED after fall at home with the head showed left-sided subdural hematoma 9 mm size and subarachnoid hemorrhage no midline shift.  ED spoke with neurosurgery who recommended to repeat a CT scan that showed enlarging subdural hematoma.   Assessment/Plan:   Traumatic left-sided SDH (subdural hematoma) and subarachnoid hemorrhage: Repeated CT scan showing enlarging subdural hematoma repeated CT scan 11/05/2023 shows stabilized. Neurosurgery was involved who recommended to start him on Keppra twice a day x7. Also recommend to try to keep systolic blood pressure less than 150. Neurosurgery recommended no intervention and repeat a CT scan in 2 weeks and follow-up with the clinic thereafter.  She was at risk of chronic subdural hematoma. Still somnolent this morning repeat CT scan of the head.  She is on no new medications outside of this was discontinued and she has not received any narcotics or pain medication.  Keppra can cause some somnolence. Consult PT OT to reevaluate  New acute metabolic encephalopathy: Likely due to polypharmacy and all her sedatives were discontinued. She is likely more weak but still significantly somnolent. Will get a CT of the head.  Dementia with behavioral disturbances: Discontinue Seroquel, avoid Haldol and Ativan. Now in mittens.  Essential hypertension: Continue metoprolol goal blood pressure less than 150.  Chronic kidney disease stage IIIa: His creatinine has remained at baseline.    DVT prophylaxis: scd Family Communication:daughter Status is: Inpatient Remains inpatient appropriate because: Traumatic subdural  hematoma    Code Status:     Code Status Orders  (From admission, onward)           Start     Ordered   11/04/23 2110  Full code  Continuous       Question:  By:  Answer:  Consent: discussion documented in EHR   11/04/23 2110           Code Status History     Date Active Date Inactive Code Status Order ID Comments User Context   06/12/2023 0016 06/16/2023 2132 Full Code [id number]  Charlsie Quest, MD ED   04/22/2023 0552 04/27/2023 2338 Full Code [id number]  Gery Pray, MD Inpatient   02/26/2020 0408 03/04/2020 0154 Full Code [id number]  Barnetta Chapel, MD ED         IV Access:   Peripheral IV   Procedures and diagnostic studies:   No results found.   Medical Consultants:   None.   Subjective:    Katie Jackson sleepy today. Objective:    Vitals:   11/08/23 2334 11/08/23 2334 11/09/23 0345 11/09/23 0811  BP: (!) 147/65 (!) 147/65 (!) 140/52 (!) 104/43  Pulse: 79 79 67 76  Resp:  18 18 17   Temp: 97.7 F (36.5 C) 97.7 F (36.5 C) 98 F (36.7 C) 98.6 F (37 C)  TempSrc: Oral Oral Oral Oral  SpO2:  96% 97% 97%  Weight:      Height:       SpO2: 97 %   Intake/Output Summary (Last 24 hours) at 11/09/2023 0836 Last data filed at 11/08/2023 1700 Gross per 24 hour  Intake --  Output 550 ml  Net -550 ml   Filed Weights   11/04/23  1801  Weight: 59.9 kg    Exam: General exam: In no acute distress. Respiratory system: Good air movement and clear to auscultation. Cardiovascular system: S1 & S2 heard, RRR. No JVD. Gastrointestinal system: Abdomen is nondistended, soft and nontender.  Extremities: No pedal edema. Skin: No rashes, lesions or ulcers Psychiatry: No judgment or insight of medical condition. Data Reviewed:    Labs: Basic Metabolic Panel: Recent Labs  Lab 11/04/23 1915 11/05/23 0216  NA 137 137  K 3.7 3.8  CL 104 107  CO2 23 23  GLUCOSE 106* 104*  BUN 12 10  CREATININE 1.06* 1.00  CALCIUM 9.3 9.0  MG  --   2.1   GFR Estimated Creatinine Clearance: 37.7 mL/min (by C-G formula based on SCr of 1 mg/dL). Liver Function Tests: No results for input(s): "AST", "ALT", "ALKPHOS", "BILITOT", "PROT", "ALBUMIN" in the last 168 hours. No results for input(s): "LIPASE", "AMYLASE" in the last 168 hours. No results for input(s): "AMMONIA" in the last 168 hours. Coagulation profile Recent Labs  Lab 11/05/23 0216  INR 1.1   COVID-19 Labs  No results for input(s): "DDIMER", "FERRITIN", "LDH", "CRP" in the last 72 hours.  Lab Results  Component Value Date   SARSCOV2NAA NEGATIVE 04/21/2023   SARSCOV2NAA NEGATIVE 02/25/2020    CBC: Recent Labs  Lab 11/04/23 1915 11/05/23 0216  WBC 10.2 10.8*  NEUTROABS 7.0  --   HGB 11.9* 11.1*  HCT 38.0 35.8*  MCV 78.0* 78.3*  PLT 289 264   Cardiac Enzymes: No results for input(s): "CKTOTAL", "CKMB", "CKMBINDEX", "TROPONINI" in the last 168 hours. BNP (last 3 results) No results for input(s): "PROBNP" in the last 8760 hours. CBG: No results for input(s): "GLUCAP" in the last 168 hours. D-Dimer: No results for input(s): "DDIMER" in the last 72 hours. Hgb A1c: No results for input(s): "HGBA1C" in the last 72 hours. Lipid Profile: No results for input(s): "CHOL", "HDL", "LDLCALC", "TRIG", "CHOLHDL", "LDLDIRECT" in the last 72 hours. Thyroid function studies: No results for input(s): "TSH", "T4TOTAL", "T3FREE", "THYROIDAB" in the last 72 hours.  Invalid input(s): "FREET3" Anemia work up: No results for input(s): "VITAMINB12", "FOLATE", "FERRITIN", "TIBC", "IRON", "RETICCTPCT" in the last 72 hours. Sepsis Labs: Recent Labs  Lab 11/04/23 1915 11/05/23 0216  WBC 10.2 10.8*   Microbiology Recent Results (from the past 240 hour(s))  Urine Culture     Status: Abnormal   Collection Time: 11/07/23  3:54 AM   Specimen: Urine, Random  Result Value Ref Range Status   Specimen Description URINE, RANDOM  Final   Special Requests   Final    NONE Reflexed  from [id number] Performed at [facility], [address], [place], [place] [address]    Culture 90,000 COLONIES/mL ESCHERICHIA COLI (A)  Final   Report Status 11/09/2023 FINAL  Final   Organism ID, Bacteria ESCHERICHIA COLI (A)  Final      Susceptibility   Escherichia coli - MIC*    AMPICILLIN <=2 SENSITIVE Sensitive     CEFAZOLIN <=4 SENSITIVE Sensitive     CEFEPIME <=0.12 SENSITIVE Sensitive     CEFTRIAXONE <=0.25 SENSITIVE Sensitive     CIPROFLOXACIN >=4 RESISTANT Resistant     GENTAMICIN <=1 SENSITIVE Sensitive     IMIPENEM <=0.25 SENSITIVE Sensitive     NITROFURANTOIN <=16 SENSITIVE Sensitive     TRIMETH/SULFA >=320 RESISTANT Resistant     AMPICILLIN/SULBACTAM <=2 SENSITIVE Sensitive     PIP/TAZO <=4 SENSITIVE Sensitive ug/mL    * 90,000 COLONIES/mL ESCHERICHIA  COLI     Medications:    mouth rinse  15 mL Mouth Rinse 4 times per day   Continuous Infusions:  levETIRAcetam 500 mg (11/08/23 2037)      LOS: 5 days   Marinda Elk  [facility]  11/09/2023, 8:36 AM

## 2023-11-09 NOTE — Progress Notes (Signed)
Physical Therapy Treatment Patient Details Name: Katie Jackson MRN: [id number] DOB: 02-08-1946 Today's Date: 11/09/2023   History of Present Illness Katie Jackson is a 77 yo female who presented after a fall backwards off of steps.  CT head showed subdural hematoma enlarging from 9 mm to 12 mm. PMHx: Alzheimer's dementia with behavioral symptoms, HTN, HLD, CKD, COPD    PT Comments  Daughter present and concerned re: potential discharge to home today or tomorrow (per her discussion with MD). Daughter observed as pt assisted with 2 person-assist to ambulate to/from bathroom. Patient initially +2 mod assist with progression to +2 min assist. Pt is impulsive and will quickly decide to change directions, which throws her off-balance. Lengthy discussion re: discharge plan and ultimately daughter/family feel she needs more rehab prior to return home with family support. Patient will benefit from continued inpatient follow up therapy, <3 hours/day    If plan is discharge home, recommend the following: Assistance with cooking/housework;Direct supervision/assist for medications management;Direct supervision/assist for financial management;Assist for transportation;Help with stairs or ramp for entrance;Supervision due to cognitive status;Two people to help with walking and/or transfers;Assistance with feeding   Can travel by private vehicle     No  Equipment Recommendations  None recommended by PT    Recommendations for Other Services       Precautions / Restrictions Precautions Precautions: Fall Precaution Comments: another fall 6 months ago Restrictions Weight Bearing Restrictions: No     Mobility  Bed Mobility Overal bed mobility: Needs Assistance Bed Mobility: Supine to Sit     Supine to sit: Max assist, +2 for physical assistance, HOB elevated     General bed mobility comments: pt assisted with moving legs over EOB and tried to scoot bottom; ultimately +2 max to raise  torso    Transfers Overall transfer level: Needs assistance Equipment used: 2 person hand held assist Transfers: Sit to/from Stand Sit to Stand: Min assist, +2 physical assistance           General transfer comment: from EOB and std toilet; min steadying assist and to prevent pt from walking away too soon    Ambulation/Gait Ambulation/Gait assistance: Min assist, Mod assist, +2 physical assistance Gait Distance (Feet): 15 Feet (toileted, 15) Assistive device: 2 person hand held assist Gait Pattern/deviations: Step-through pattern, Decreased stride length, Drifts right/left, Narrow base of support   Gait velocity interpretation: 1.31 - 2.62 ft/sec, indicative of limited community ambulator   General Gait Details: initially +2 mod assist with pt drifting to either side; on return she was +2 min assist and much straighter path   Stairs             Wheelchair Mobility     Tilt Bed    Modified Rankin (Stroke Patients Only)       Balance Overall balance assessment: Needs assistance Sitting-balance support: Feet unsupported, Single extremity supported, Bilateral upper extremity supported, No upper extremity supported Sitting balance-Leahy Scale: Fair Sitting balance - Comments: able to maintain with supervision for safety   Standing balance support: During functional activity, Single extremity supported Standing balance-Leahy Scale: Poor Standing balance comment: single UE support while 2nd person manages pericare                            Cognition Arousal: Alert Behavior During Therapy: Impulsive Overall Cognitive Status: History of cognitive impairments - at baseline  General Comments: smiling appropriately; yells out if she doesn't like something (per daughter); understood "let's go to the bathroom" however fully dependent in managing undergarments and had already had BM        Exercises       General Comments General comments (skin integrity, edema, etc.): Daughter present and very concerned re: pt's ability to be cared for safely at home. Answered all questions (as able) and daughter discussed with family members. Ultimately decided they want to try SNF level rehab.      Pertinent Vitals/Pain Pain Assessment Pain Assessment: Faces Faces Pain Scale: No hurt    Home Living                          Prior Function            PT Goals (current goals can now be found in the care plan section) Acute Rehab PT Goals PT Goal Formulation: With patient/family Time For Goal Achievement: 11/20/23 Potential to Achieve Goals: Fair Progress towards PT goals: Progressing toward goals    Frequency    Min 1X/week      PT Plan      Co-evaluation              AM-PAC PT "6 Clicks" Mobility   Outcome Measure  Help needed turning from your back to your side while in a flat bed without using bedrails?: Total Help needed moving from lying on your back to sitting on the side of a flat bed without using bedrails?: Total Help needed moving to and from a bed to a chair (including a wheelchair)?: Total Help needed standing up from a chair using your arms (e.g., wheelchair or bedside chair)?: Total Help needed to walk in hospital room?: Total Help needed climbing 3-5 steps with a railing? : Total 6 Click Score: 6    End of Session Equipment Utilized During Treatment: Gait belt Activity Tolerance: Patient limited by lethargy Patient left: with call bell/phone within reach;in chair;with chair alarm set;with nursing/sitter in room;with family/visitor present (waist belt alarm) Nurse Communication: Other (comment);Mobility status (NT in room) PT Visit Diagnosis: Muscle weakness (generalized) (M62.81);Repeated falls (R29.6);Other symptoms and signs involving the nervous system (R29.898)     Time: 1610-9604 PT Time Calculation (min) (ACUTE ONLY): 39 min  Charges:     $Gait Training: 8-22 mins PT General Charges $$ ACUTE PT VISIT: 1 Visit                      Jerolyn Center, PT Acute Rehabilitation Services  Office [phone]    Zena Amos 11/09/2023, 11:41 AM

## 2023-11-09 NOTE — Plan of Care (Signed)

## 2023-11-09 NOTE — NC FL2 (Addendum)
[place] MEDICAID [facility] LEVEL OF CARE FORM     IDENTIFICATION  Patient Name: Katie Jackson Birthdate: [birth year] Sex: female Admission Date (Current Location): 11/04/2023  [facility] and [place] Number:  Producer, television/film/video and Address:  [employer] [facility]. [facility], [address] [place], [place] [address]      Provider Number: [id number]  Attending Physician Name and Address:  Marinda Elk, MD  Relative Name and Phone Number:       Current Level of Care: Hospital Recommended Level of Care: Skilled Nursing Facility Prior Approval Number:    Date Approved/Denied:   PASRR Number: [id number]  Discharge Plan: SNF    Current Diagnoses: Patient Active Problem List   Diagnosis Date Noted   SDH (subdural hematoma) (HCC) 11/04/2023   UTI (urinary tract infection) 06/12/2023   Acute encephalopathy 06/11/2023   Stage 3a chronic kidney disease (CKD) (HCC) 06/11/2023   Acute cystitis 04/22/2023   HTN (hypertension) 04/22/2023   Dementia with behavioral disturbance (HCC) 02/28/2020   Pressure injury of skin 02/26/2020   AKI (acute kidney injury) (HCC) 02/25/2020    Orientation RESPIRATION BLADDER Height & Weight     Self  Normal Incontinent Weight: 132 lb (59.9 kg) Height:  5\' 3"  (160 cm)  BEHAVIORAL SYMPTOMS/MOOD NEUROLOGICAL BOWEL NUTRITION STATUS      Incontinent Diet (DYS 3)  AMBULATORY STATUS COMMUNICATION OF NEEDS Skin   Extensive Assistance Verbally Normal                       Personal Care Assistance Level of Assistance  Bathing, Feeding, Dressing  Bathing Assistance: Maximum assistance Feeding Assistance: Maximum assistance Dressing Assistance: Maximum assistance        Functional Limitations Info             SPECIAL CARE FACTORS FREQUENCY  PT (By licensed PT), OT (By licensed OT)     PT Frequency: 5x/week OT Frequency: 5x/week            Contractures      Additional Factors Info  Code Status, Allergies Code  Status Info: Full Allergies Info: Penicillins           Current Medications (11/09/2023):  This is the current hospital active medication list Current Facility-Administered Medications  Medication Dose Route Frequency Provider Last Rate Last Admin   acetaminophen (TYLENOL) tablet 650 mg  650 mg Oral Q6H PRN Crosley, Debby, MD       Or   acetaminophen (TYLENOL) suppository 650 mg  650 mg Rectal Q6H PRN Crosley, Debby, MD       haloperidol lactate (HALDOL) injection 1 mg  1 mg Intravenous Q6H PRN Marinda Elk, MD       lactulose (CHRONULAC) 10 GM/15ML solution 10 g  10 g Oral BID Marinda Elk, MD       levETIRAcetam (KEPPRA) tablet 250 mg  250 mg Oral BID Marinda Elk, MD   250 mg at 11/09/23 1011   metoprolol tartrate (LOPRESSOR) injection 5 mg  5 mg Intravenous Q2H PRN Gery Pray, MD   5 mg at 11/08/23 2041   Oral care mouth rinse  15 mL Mouth Rinse 4 times per day Marinda Elk, MD   15 mL at 11/08/23 2227   Oral care mouth rinse  15 mL Mouth Rinse PRN Marinda Elk, MD         Discharge Medications: Please see discharge summary for a list of discharge medications.  Relevant Imaging Results:  Relevant Lab Results:   Additional Information SS# [id number]  Antion Felipa [place], Student-Social Work

## 2023-11-09 NOTE — Progress Notes (Signed)
Occupational Therapy Treatment Patient Details Name: Katie Jackson MRN: [id number] DOB: [birth year] Today's Date: 11/09/2023   History of present illness Katie Jackson is a 77 yo female who presented after a fall backwards off of steps.  CT head showed subdural hematoma enlarging from 9 mm to 12 mm. PMHx: Alzheimer's dementia with behavioral symptoms, HTN, HLD, CKD, COPD   OT comments  Pt is making fair progress towards their acute OT goals. Pt seen with PT to progress OOB. Overall pt needed min-mod A +2 with HHA for all mobility within the room due to impulsivity and unsteady balance. Total A needed for peri hygiene and LB ADLs after toileting. OT to continue to follow acutely to facilitate progress towards established goals. Per pt's daughter, she is now requesting skilled inpatient follow up therapy, <3 hours/day dur to unsteady gait and falls risk.        If plan is discharge home, recommend the following:  A lot of help with walking and/or transfers;A lot of help with bathing/dressing/bathroom;Assistance with cooking/housework;Assistance with feeding;Direct supervision/assist for medications management;Direct supervision/assist for financial management;Assist for transportation;Help with stairs or ramp for entrance;Supervision due to cognitive status   Equipment Recommendations  None recommended by OT       Precautions / Restrictions Precautions Precautions: Fall Precaution Comments: another fall 6 months ago Restrictions Weight Bearing Restrictions: No       Mobility Bed Mobility Overal bed mobility: Needs Assistance Bed Mobility: Supine to Sit     Supine to sit: Max assist, +2 for physical assistance, HOB elevated     General bed mobility comments: pt assisted with moving legs over EOB and tried to scoot bottom; ultimately +2 max to raise torso    Transfers Overall transfer level: Needs assistance Equipment used: 2 person hand held assist Transfers: Sit  to/from Stand Sit to Stand: Min assist, +2 physical assistance           General transfer comment: from EOB and std toilet; min steadying assist and to prevent pt from walking away too soon     Balance Overall balance assessment: Needs assistance Sitting-balance support: Feet unsupported, Single extremity supported, Bilateral upper extremity supported, No upper extremity supported Sitting balance-Leahy Scale: Fair     Standing balance support: During functional activity, Single extremity supported Standing balance-Leahy Scale: Poor                             ADL either performed or assessed with clinical judgement   ADL Overall ADL's : Needs assistance/impaired                     Lower Body Dressing: Total assistance;+2 for physical assistance;+2 for safety/equipment   Toilet Transfer: Moderate assistance;+2 for physical assistance;+2 for safety/equipment;Ambulation   Toileting- Clothing Manipulation and Hygiene: Total assistance;+2 for physical assistance;+2 for safety/equipment       Functional mobility during ADLs: Moderate assistance;+2 for physical assistance;Minimal assistance;+2 for safety/equipment General ADL Comments: pt more alert and able to progress OOB today. min-mod A +2 HHA needed for ambulation. pt impulsive with limited insight. Pt with DM in diaper upon arrival. Understood cues to ambulate to the bathroom, total A for toileting hygiene and clothing mangement    Extremity/Trunk Assessment Upper Extremity Assessment Upper Extremity Assessment: Generalized weakness   Lower Extremity Assessment Lower Extremity Assessment: Defer to PT evaluation        Vision   Vision Assessment?: No  apparent visual deficits   Perception Perception Perception: Not tested   Praxis Praxis Praxis: Not tested    Cognition Arousal: Alert Behavior During Therapy: Impulsive Overall Cognitive Status: History of cognitive impairments - at baseline                                  General Comments: smiling appropriately; yells out if she doesn't like something (per daughter); understood "let's go to the bathroom" however fully dependent in managing undergarments and had already had BM              General Comments Dtr present and now requesting SNF discharge    Pertinent Vitals/ Pain       Pain Assessment Pain Assessment: Faces Pain Score: 0-No pain Pain Intervention(s): Monitored during session  Home Living Family/patient expects to be discharged to:: Private residence Living Arrangements: Other relatives Available Help at Discharge: Family;Available 24 hours/day Type of Home: Apartment Home Access: Level entry Entrance Stairs-Number of Steps: 1 Entrance Stairs-Rails: Right;Left Home Layout: One level                              Frequency  Min 1X/week        Progress Toward Goals  OT Goals(current goals can now be found in the care plan section)  Progress towards OT goals: Progressing toward goals  Acute Rehab OT Goals Patient Stated Goal: unable OT Goal Formulation: With patient Time For Goal Achievement: 11/20/23 Potential to Achieve Goals: Fair ADL Goals Pt Will Perform Eating: with min assist;sitting Pt Will Perform Grooming: with mod assist;standing Pt Will Transfer to Toilet: with mod assist;ambulating;regular height toilet Additional ADL Goal #1: Pt will follow simple 1 step commands to complete ADLs with minimal repetition  Plan      Co-evaluation    PT/OT/SLP Co-Evaluation/Treatment: Yes Reason for Co-Treatment: Complexity of the patient's impairments (multi-system involvement);For patient/therapist safety;To address functional/ADL transfers   OT goals addressed during session: ADL's and self-care      AM-PAC OT "6 Clicks" Daily Activity     Outcome Measure   Help from another person eating meals?: A Lot Help from another person taking care of personal  grooming?: A Lot Help from another person toileting, which includes using toliet, bedpan, or urinal?: A Lot Help from another person bathing (including washing, rinsing, drying)?: A Lot Help from another person to put on and taking off regular upper body clothing?: A Lot Help from another person to put on and taking off regular lower body clothing?: Total 6 Click Score: 11    End of Session Equipment Utilized During Treatment: Gait belt  OT Visit Diagnosis: Unsteadiness on feet (R26.81);Other abnormalities of gait and mobility (R26.89);Muscle weakness (generalized) (M62.81);History of falling (Z91.81)   Activity Tolerance Patient limited by lethargy   Patient Left in bed;with call bell/phone within reach;with bed alarm set;with family/visitor present;with restraints reapplied   Nurse Communication Precautions;Mobility status        Time: 9528-4132 OT Time Calculation (min): 39 min  Charges: OT General Charges $OT Visit: 1 Visit OT Treatments $Self Care/Home Management : 23-37 mins  Derenda Mis, OTR/L Acute Rehabilitation Services Office [phone] Secure Chat Communication Preferred   Donia Pounds 11/09/2023, 1:55 PM

## 2023-11-09 NOTE — TOC Progression Note (Signed)
Transition of Care [facility]) - Progression Note    Patient Details  Name: Katie Jackson MRN: [id number] Date of Birth: 25-Nov-1946  Transition of Care [facility]) CM/SW Contact  Kermit Balo, RN Phone Number: 11/09/2023, 11:59 AM  Clinical Narrative:     Daughter has changed her mind about discharging home and wants SNF rehab. She asked to have her faxed out in the [facility] area. LCSW updated.  TOC following.  Expected Discharge Plan: Home w Home Health Services Barriers to Discharge: Continued Medical Work up  Expected Discharge Plan and Services   Discharge Planning Services: CM Consult Post Acute Care Choice: Home Health Living arrangements for the past 2 months: Apartment                           HH Arranged: PT, OT, Social Work, Advertising account executive HH Agency: [facility] Date [facility] Agency Contacted: 11/06/23   Representative spoke with at [facility] Agency: Tresa Endo   Social Determinants of Health (SDOH) Interventions SDOH Screenings   Food Insecurity: Patient Unable To Answer (11/04/2023)  Housing: High Risk (11/04/2023)  Transportation Needs: Patient Unable To Answer (11/04/2023)  Utilities: Patient Unable To Answer (11/04/2023)  Tobacco Use: Medium Risk (11/04/2023)    Readmission Risk Interventions    06/16/2023    9:49 AM 04/24/2023    4:40 PM  Readmission Risk Prevention Plan  Post Dischage Appt  Complete  Medication Screening  Complete  Transportation Screening Complete Complete  PCP or Specialist Appt within 5-7 Days Complete   Home Care Screening Complete   Medication Review (RN CM) Complete

## 2023-11-10 DIAGNOSIS — S065XAA Traumatic subdural hemorrhage with loss of consciousness status unknown, initial encounter: Secondary | ICD-10-CM | POA: Diagnosis not present

## 2023-11-10 NOTE — TOC Progression Note (Addendum)
Transition of Care [facility]) - Progression Note    Patient Details  Name: Katie Jackson MRN: [id number] Date of Birth: Oct 04, 1946  Transition of Care [facility]) CM/SW Contact  Baldemar Lenis, [place] Phone Number: 11/10/2023, 11:07 AM  Clinical Narrative:   CSW met with patient's daughter, Bjorn Loser, at bedside to provide bed offers for SNF. Rhonda reviewed options, preference for [facility] as top choice. CSW contacted [facility] to ask them to review referral. CSW to follow.  UPDATE: CSW received denial from [facility] for SNF. CSW contacted [facility], they have also declined. CSW reached out to [facility], they are reviewing. CSW to follow.    Expected Discharge Plan: Skilled Nursing Facility Barriers to Discharge: Continued Medical Work up  Expected Discharge Plan and Services   Discharge Planning Services: CM Consult Post Acute Care Choice: Home Health Living arrangements for the past 2 months: Apartment                           HH Arranged: PT, OT, Social Work, Advertising account executive HH Agency: [facility] Date [facility] Agency Contacted: 11/06/23   Representative spoke with at [facility] Agency: Tresa Endo   Social Determinants of Health (SDOH) Interventions SDOH Screenings   Food Insecurity: Patient Unable To Answer (11/04/2023)  Housing: High Risk (11/04/2023)  Transportation Needs: Patient Unable To Answer (11/04/2023)  Utilities: Patient Unable To Answer (11/04/2023)  Tobacco Use: Medium Risk (11/04/2023)    Readmission Risk Interventions    06/16/2023    9:49 AM 04/24/2023    4:40 PM  Readmission Risk Prevention Plan  Post Dischage Appt  Complete  Medication Screening  Complete  Transportation Screening Complete Complete  PCP or Specialist Appt within 5-7 Days Complete   Home Care Screening Complete   Medication Review (RN CM) Complete

## 2023-11-10 NOTE — Progress Notes (Signed)
[facility] PROGRESS NOTE    Progress Note  Katie Jackson  KGM:[id number] DOB: Jan 24, 1946 DOA: 11/04/2023 PCP: Georgann Housekeeper, MD     Brief Narrative:   Katie Jackson is an 77 y.o. female past medical history significant for Alzheimer's dementia behavioral disturbance essential hypertension COPD brought into the ED after fall at home with the head showed left-sided subdural hematoma 9 mm size and subarachnoid hemorrhage no midline shift.  ED spoke with neurosurgery who recommended to repeat a CT scan that showed enlarging subdural hematoma.   Assessment/Plan:   Traumatic left-sided SDH (subdural hematoma) and subarachnoid hemorrhage: Repeated CT scan showing enlarging subdural hematoma repeated CT scan 11/05/2023 shows stabilized. Neurosurgery was involved who recommended to start him on Keppra twice a day x7. Also recommend to try to keep systolic blood pressure less than 150. Neurosurgery recommended no intervention and repeat a CT scan in 2 weeks and follow-up with the clinic thereafter.  She was at risk of chronic subdural hematoma. CT scan of the head showed improvement of her hematoma.  Mentation is stable. PT evaluated the patient, will need skilled nursing facility.  New acute metabolic encephalopathy: Likely due to polypharmacy and subdural hematoma ad all her sedatives were discontinued. Mentation is at baseline.  Dementia with behavioral disturbances: Discontinue Seroquel, avoid Haldol and Ativan. Now in mittens.  Essential hypertension: Continue metoprolol goal blood pressure less than 150.  Chronic kidney disease stage IIIa: His creatinine has remained at baseline.    DVT prophylaxis: scd Family Communication:daughter Status is: Inpatient Remains inpatient appropriate because: Traumatic subdural hematoma    Code Status:     Code Status Orders  (From admission, onward)           Start     Ordered   11/04/23 2110  Full code   Continuous       Question:  By:  Answer:  Consent: discussion documented in EHR   11/04/23 2110           Code Status History     Date Active Date Inactive Code Status Order ID Comments User Context   06/12/2023 0016 06/16/2023 2132 Full Code [id number]  Charlsie Quest, MD ED   04/22/2023 0552 04/27/2023 2338 Full Code [id number]  Gery Pray, MD Inpatient   02/26/2020 0408 03/04/2020 0154 Full Code [id number]  Barnetta Chapel, MD ED         IV Access:   Peripheral IV   Procedures and diagnostic studies:   CT HEAD WO CONTRAST ( )  Result Date: 11/09/2023 CLINICAL DATA:  Encephalopathy EXAM: CT HEAD WITHOUT CONTRAST TECHNIQUE: Contiguous axial images were obtained from the base of the skull through the vertex without intravenous contrast. RADIATION DOSE REDUCTION: This exam was performed according to the departmental dose-optimization program which includes automated exposure control, adjustment of the mA and/or kV according to patient size and/or use of iterative reconstruction technique. COMPARISON:  Four days ago FINDINGS: Brain: Acute subdural hematoma along the left cerebral convexity measuring up to 12 mm in thickness at the mid sylvian fissure, unchanged. Regional subarachnoid hemorrhage which is stable or diminished. No new abnormality. In the setting of brain atrophy there is no significant cerebral mass effect or midline shift. No evidence of infarct, hydrocephalus, or mass. Vascular: No hyperdense vessel or unexpected calcification. Skull: Left parietal scalp hematoma without calvarial fracture. Sinuses/Orbits: Bilateral cataract resection. IMPRESSION: No progression of subarachnoid and subdural hemorrhage on the left. No significant brain mass effect in the setting of atrophy.  Electronically Signed   By: Tiburcio Pea M.D.   On: 11/09/2023 13:24     Medical Consultants:   None.   Subjective:    [name] sleepy today. Objective:    Vitals:    11/09/23 1953 11/09/23 2329 11/10/23 0339 11/10/23 0809  BP: 127/60 (!) 130/90 (!) 145/63 (!) 108/52  Pulse: 89 87 82 85  Resp: 18 18 18 18   Temp: 98 F (36.7 C) 98.2 F (36.8 C) 98.9 F (37.2 C) 97.8 F (36.6 C)  TempSrc: Oral Oral Oral Oral  SpO2: 99% 100% 99% 99%  Weight:      Height:       SpO2: 99 %   Intake/Output Summary (Last 24 hours) at 11/10/2023 0828 Last data filed at 11/09/2023 1738 Gross per 24 hour  Intake 356 ml  Output --  Net 356 ml   Filed Weights   11/04/23 1801  Weight: 59.9 kg    Exam: General exam: In no acute distress. Respiratory system: Good air movement and clear to auscultation. Cardiovascular system: S1 & S2 heard, RRR. No JVD. Gastrointestinal system: Abdomen is nondistended, soft and nontender.  Extremities: No pedal edema. Skin: No rashes, lesions or ulcers Psychiatry: No judgment or insight of medical condition. Data Reviewed:    Labs: Basic Metabolic Panel: Recent Labs  Lab 11/04/23 1915 11/05/23 0216  NA 137 137  K 3.7 3.8  CL 104 107  CO2 23 23  GLUCOSE 106* 104*  BUN 12 10  CREATININE 1.06* 1.00  CALCIUM 9.3 9.0  MG  --  2.1   GFR Estimated Creatinine Clearance: 37.7 mL/min (by C-G formula based on SCr of 1 mg/dL). Liver Function Tests: No results for input(s): "AST", "ALT", "ALKPHOS", "BILITOT", "PROT", "ALBUMIN" in the last 168 hours. No results for input(s): "LIPASE", "AMYLASE" in the last 168 hours. Recent Labs  Lab 11/09/23 0951  AMMONIA 45*   Coagulation profile Recent Labs  Lab 11/05/23 0216  INR 1.1   COVID-19 Labs  No results for input(s): "DDIMER", "FERRITIN", "LDH", "CRP" in the last 72 hours.  Lab Results  Component Value Date   SARSCOV2NAA NEGATIVE 04/21/2023   SARSCOV2NAA NEGATIVE 02/25/2020    CBC: Recent Labs  Lab 11/04/23 1915 11/05/23 0216  WBC 10.2 10.8*  NEUTROABS 7.0  --   HGB 11.9* 11.1*  HCT 38.0 35.8*  MCV 78.0* 78.3*  PLT 289 264   Cardiac Enzymes: No results  for input(s): "CKTOTAL", "CKMB", "CKMBINDEX", "TROPONINI" in the last 168 hours. BNP (last 3 results) No results for input(s): "PROBNP" in the last 8760 hours. CBG: No results for input(s): "GLUCAP" in the last 168 hours. D-Dimer: No results for input(s): "DDIMER" in the last 72 hours. Hgb A1c: No results for input(s): "HGBA1C" in the last 72 hours. Lipid Profile: No results for input(s): "CHOL", "HDL", "LDLCALC", "TRIG", "CHOLHDL", "LDLDIRECT" in the last 72 hours. Thyroid function studies: No results for input(s): "TSH", "T4TOTAL", "T3FREE", "THYROIDAB" in the last 72 hours.  Invalid input(s): "FREET3" Anemia work up: No results for input(s): "VITAMINB12", "FOLATE", "FERRITIN", "TIBC", "IRON", "RETICCTPCT" in the last 72 hours. Sepsis Labs: Recent Labs  Lab 11/04/23 1915 11/05/23 0216  WBC 10.2 10.8*   Microbiology Recent Results (from the past 240 hour(s))  Urine Culture     Status: Abnormal   Collection Time: 11/07/23  3:54 AM   Specimen: Urine, Random  Result Value Ref Range Status   Specimen Description URINE, RANDOM  Final   Special Requests   Final  NONE Reflexed from [id number] Performed at [facility], [address] [place], [place] [address]    Culture 90,000 COLONIES/mL ESCHERICHIA COLI (A)  Final   Report Status 11/09/2023 FINAL  Final   Organism ID, Bacteria ESCHERICHIA COLI (A)  Final      Susceptibility   Escherichia coli - MIC*    AMPICILLIN <=2 SENSITIVE Sensitive     CEFAZOLIN <=4 SENSITIVE Sensitive     CEFEPIME <=0.12 SENSITIVE Sensitive     CEFTRIAXONE <=0.25 SENSITIVE Sensitive     CIPROFLOXACIN >=4 RESISTANT Resistant     GENTAMICIN <=1 SENSITIVE Sensitive     IMIPENEM <=0.25 SENSITIVE Sensitive     NITROFURANTOIN <=16 SENSITIVE Sensitive     TRIMETH/SULFA >=320 RESISTANT Resistant     AMPICILLIN/SULBACTAM <=2 SENSITIVE Sensitive     PIP/TAZO <=4 SENSITIVE Sensitive ug/mL    * 90,000 COLONIES/mL ESCHERICHIA COLI     Medications:     lactulose  10 g Oral BID   levETIRAcetam  250 mg Oral BID   mouth rinse  15 mL Mouth Rinse 4 times per day   Continuous Infusions:      LOS: 6 days   Marinda Elk  [facility]  11/10/2023, 8:28 AM

## 2023-11-11 DIAGNOSIS — S065XAA Traumatic subdural hemorrhage with loss of consciousness status unknown, initial encounter: Secondary | ICD-10-CM | POA: Diagnosis not present

## 2023-11-11 LAB — CBC WITH DIFFERENTIAL/PLATELET
Abs Immature Granulocytes: 0.03 10*3/uL (ref 0.00–0.07)
Basophils Absolute: 0.1 10*3/uL (ref 0.0–0.1)
Basophils Relative: 1 %
Eosinophils Absolute: 0.2 10*3/uL (ref 0.0–0.5)
Eosinophils Relative: 2 %
HCT: 34.8 % — ABNORMAL LOW (ref 36.0–46.0)
Hemoglobin: 11 g/dL — ABNORMAL LOW (ref 12.0–15.0)
Immature Granulocytes: 0 %
Lymphocytes Relative: 33 %
Lymphs Abs: 2.7 10*3/uL (ref 0.7–4.0)
MCH: 24.4 pg — ABNORMAL LOW (ref 26.0–34.0)
MCHC: 31.6 g/dL (ref 30.0–36.0)
MCV: 77.2 fL — ABNORMAL LOW (ref 80.0–100.0)
Monocytes Absolute: 0.7 10*3/uL (ref 0.1–1.0)
Monocytes Relative: 9 %
Neutro Abs: 4.3 10*3/uL (ref 1.7–7.7)
Neutrophils Relative %: 55 %
Platelets: 257 10*3/uL (ref 150–400)
RBC: 4.51 MIL/uL (ref 3.87–5.11)
RDW: 16.5 % — ABNORMAL HIGH (ref 11.5–15.5)
WBC: 7.9 10*3/uL (ref 4.0–10.5)
nRBC: 0 % (ref 0.0–0.2)

## 2023-11-11 LAB — GLUCOSE, CAPILLARY: Glucose-Capillary: 98 mg/dL (ref 70–99)

## 2023-11-11 MED ORDER — HALOPERIDOL LACTATE 5 MG/ML IJ SOLN
2.0000 mg | Freq: Four times a day (QID) | INTRAMUSCULAR | Status: DC | PRN
  Administered 2023-11-11: 5 mg via INTRAVENOUS
  Filled 2023-11-11: qty 1

## 2023-11-11 NOTE — Hospital Course (Signed)
77yo with h/o dementia, HTN, and COPD who presented on 11/16 with a fall at home, was found to have a L-sided SDH and SAH.  Neurosurgery consulted and recommended a repeat CT that showed enlarging SDH which has stabilized.  She was started on Keppra with goal SBP <150.  PT recommends SNF rehab.

## 2023-11-11 NOTE — Progress Notes (Signed)
Pt has become restless and had to be redirected several times not to try and get up unassisted. Pt became agitated gave PRN haldol med was effective pt now calm and settled in her room

## 2023-11-11 NOTE — Progress Notes (Signed)
Progress Note   Patient: Katie Jackson [id number]:[id number] DOB: 10/11/1946 DOA: 11/04/2023     7 DOS: the patient was seen and examined on 11/11/2023   Brief hospital course: 77yo with h/o dementia, HTN, and COPD who presented on 11/16 with a fall at home, was found to have a L-sided SDH and SAH.  Neurosurgery consulted and recommended a repeat CT that showed enlarging SDH which has stabilized.  She was started on Keppra with goal SBP <150.  PT recommends SNF rehab.  Assessment and Plan:  Traumatic left-sided SDH (subdural hematoma) and subarachnoid hemorrhage Repeated CT scan showing enlarging subdural hematoma; repeated CT scan 11/05/2023 shows stabilized Neurosurgery was involved who recommended to start her on Keppra twice a day x 7 days Also recommend to try to keep systolic blood pressure less than 150 Neurosurgery recommended no intervention and repeat a CT scan in 2 weeks and follow-up with the clinic thereafter She is at risk of chronic subdural hematoma PT evaluated the patient, will need skilled nursing facility Will consult palliative care   Dementia with behavioral disturbances There was some concern for acute metabolic encephalopathy but this appears to be at baseline Seroquel makes her hypersomnolent so daughter was only giving as needed Avoid Ativan Delirium precautions Will add prn Haldol for agitation   Essential hypertension Continue prn metoprolol  Goal blood pressure less than 150.   Chronic kidney disease stage IIIa Appears to be stable at this time Attempt to avoid nephrotoxic medications     Consultants: Neurosurgery PT OT SLP [facility] team  Procedures: None  Antibiotics: None  30 Day Unplanned Readmission Risk Score    Flowsheet Row ED to Hosp-Admission (Current) from 11/04/2023 in [facility]  30 Day Unplanned Readmission Risk Score (%) 13.31 Filed at 11/11/2023 0400       This score is the patient's risk of an  unplanned readmission within 30 days of being discharged (0 -100%). The score is based on dignosis, age, lab data, medications, orders, and past utilization.   Low:  0-14.9   Medium: 15-21.9   High: 22-29.9   Extreme: 30 and above           Subjective: She only said "no" today.   I spoke with her daughter.  She has some sundowning at baseline with dementia.  She uses her hands a lot - puts clothes and foods into the commode, constantly moving her hands.  She is able to walk well.  She feeds herself.  She wears pull ups, touches her hand into the diaper periodically.  She was down to 2 words - "no" and "damn".  Her neurologist stopped the memory pill and her PCP stopped the cholesterol medication and she started to talk more.  Speech can be fluent but nonsensical or sometimes can be clear.   She has dementia a long time and has gotten worse but her daughter thinks she would want to be full code.  Her daughter is in agreement with SNF rehab.   Objective: Vitals:   11/11/23 0805 11/11/23 1133  BP: (!) 95/57 125/63  Pulse: 84 88  Resp: 17 17  Temp: 98.9 F (37.2 C) 97.6 F (36.4 C)  SpO2: 97% 99%    Intake/Output Summary (Last 24 hours) at 11/11/2023 1344 Last data filed at 11/11/2023 0500 Gross per 24 hour  Intake 120 ml  Output 500 ml  Net -380 ml   Filed Weights   11/04/23 1801  Weight: 59.9 kg  Exam:  General:  Appears calm and comfortable and is in NAD, unable to engage  Eyes:   EOMI, normal lids, iris ENT:  grossly normal hearing, lips & tongue, mmm Neck:  no LAD, masses or thyromegaly Cardiovascular:  RRR, no m/r/g. No LE edema.  Respiratory:   CTA bilaterally with no wheezes/rales/rhonchi.  Normal respiratory effort. Abdomen:  soft, NT, ND Skin:  no rash or induration seen on limited exam Musculoskeletal:  grossly normal tone BUE/BLE, good ROM, no bony abnormality Psychiatric:  pleasant but mildly agitated mood and affect, speech with repetitive "no" and no  other verbalizations Neurologic:  grossly normal but difficult to assess  Data Reviewed: I have reviewed the patient's lab results since admission.  Pertinent labs for today include:   None     Family Communication: None present; I spoke with her daughter by telephone  Disposition: Status is: Inpatient Remains inpatient appropriate because: ongoing management     Time spent: 50 minutes  Unresulted Labs (From admission, onward)     Start     Ordered   11/12/23 0500  Basic metabolic panel  Tomorrow morning,   R       Question:  Specimen collection method  Answer:  Lab=Lab collect   11/11/23 0744             Author: Jonah Blue, MD 11/11/2023 1:44 PM  For on call review [email or web address].

## 2023-11-12 DIAGNOSIS — Z7189 Other specified counseling: Secondary | ICD-10-CM

## 2023-11-12 DIAGNOSIS — S065XAA Traumatic subdural hemorrhage with loss of consciousness status unknown, initial encounter: Secondary | ICD-10-CM | POA: Diagnosis not present

## 2023-11-12 DIAGNOSIS — Z515 Encounter for palliative care: Secondary | ICD-10-CM | POA: Diagnosis not present

## 2023-11-12 LAB — BASIC METABOLIC PANEL
Anion gap: 9 (ref 5–15)
BUN: 18 mg/dL (ref 8–23)
CO2: 19 mmol/L — ABNORMAL LOW (ref 22–32)
Calcium: 9 mg/dL (ref 8.9–10.3)
Chloride: 106 mmol/L (ref 98–111)
Creatinine, Ser: 0.98 mg/dL (ref 0.44–1.00)
GFR, Estimated: 59 mL/min — ABNORMAL LOW (ref >60)
Glucose, Bld: 107 mg/dL — ABNORMAL HIGH (ref 70–99)
Potassium: 4 mmol/L (ref 3.5–5.1)
Sodium: 134 mmol/L — ABNORMAL LOW (ref 135–145)

## 2023-11-12 MED ORDER — CEFTRIAXONE SODIUM 1 G IJ SOLR
1.0000 g | INTRAMUSCULAR | Status: AC
  Administered 2023-11-12 – 2023-11-14 (×3): 1 g via INTRAVENOUS
  Filled 2023-11-12 (×3): qty 10

## 2023-11-12 MED ORDER — QUETIAPINE FUMARATE 25 MG PO TABS: 25.0000 mg | ORAL_TABLET | Freq: Every day | ORAL | Status: DC | PRN

## 2023-11-12 MED ORDER — QUETIAPINE FUMARATE 25 MG PO TABS: 25.0000 mg | ORAL_TABLET | Freq: Every day | ORAL | Status: DC

## 2023-11-12 NOTE — Progress Notes (Signed)
Progress Note   Patient: Katie Jackson [id number]:[id number] DOB: [birth year] DOA: 11/04/2023     8 DOS: the patient was seen and examined on 11/12/2023   Brief hospital course: 77yo with h/o dementia, HTN, and COPD who presented on 11/16 with a fall at home, was found to have a L-sided SDH and SAH.  Neurosurgery consulted and recommended a repeat CT that showed enlarging SDH which has stabilized.  She was started on Keppra with goal SBP <150.  PT recommends SNF rehab.  Assessment and Plan:  Traumatic left-sided SDH (subdural hematoma) and subarachnoid hemorrhage SDH/SAH present on admission, repeat CT scan showing enlargement of SDH; repeated CT scan 11/05/2023 shows stabilization Neurosurgery was involved who recommended to start her on Keppra twice a day x 7 days Also recommend to try to keep systolic blood pressure less than 150 Neurosurgery recommended no intervention and repeat a CT scan in 2 weeks and follow-up with the clinic thereafter She is at risk of chronic subdural hematoma PT evaluated the patient, will need skilled nursing facility Consulted palliative care   Dementia with behavioral disturbances There was some concern for acute metabolic encephalopathy but this appears to be at baseline Seroquel makes her hypersomnolent so daughter was only giving as needed at home Given Seroquel + Haldol + Ativan for agitation which led to significant hypersomnolence so now her daughter wants avoidance of all medications - but this is challenging because she gets quite agitated when family is not present Avoid Ativan Delirium precautions Order for Prn Haldol for agitation - family prefers that we avoid this Discussing use of Seroquel but she is likely to do better if this is given before agitation starts with sundowning Palliative care assistance is greatly appreciated Family prefers that we engage a sitter Patient will need no sitter or restraints (including chemical) for at least 24  hours prior to discharge to SNF   Essential hypertension Continue prn metoprolol  Goal blood pressure less than 150   Chronic kidney disease stage IIIa Appears to be stable at this time Attempt to avoid nephrotoxic medications  UTI Vs. Colonization Given concern for AMS and agitation, will treat with Ceftriaxone x 3 days        Consultants: Neurosurgery PT OT SLP [facility] team   Procedures: None   Antibiotics: None  30 Day Unplanned Readmission Risk Score    Flowsheet Row ED to Hosp-Admission (Current) from 11/04/2023 in [facility]  30 Day Unplanned Readmission Risk Score (%) 14.84 Filed at 11/12/2023 0401       This score is the patient's risk of an unplanned readmission within 30 days of being discharged (0 -100%). The score is based on dignosis, age, lab data, medications, orders, and past utilization.   Low:  0-14.9   Medium: 15-21.9   High: 22-29.9   Extreme: 30 and above           Subjective: Patient answers "yes" to pain but no other verbalizations so unclear if she is actually having pain.  She was feeding herself breakfast without difficulty during my evaluation.   Objective: Vitals:   11/11/23 2313 11/12/23 0346  BP: 127/66 125/61  Pulse: 96 89  Resp: 18 18  Temp: 97.9 F (36.6 C) 98 F (36.7 C)  SpO2:  96%    Intake/Output Summary (Last 24 hours) at 11/12/2023 0742 Last data filed at 11/12/2023 0400 Gross per 24 hour  Intake 100 ml  Output --  Net 100 ml   Ceasar Mons  Weights   11/04/23 1801  Weight: 59.9 kg    Exam:  General:  Appears calm and comfortable and is in NAD, not engaged  Eyes:  EOMI, normal lids, iris ENT:  grossly normal hearing, lips & tongue, mmm; Cardiovascular:  RRR, no m/r/g. No LE edema.  Respiratory:   CTA bilaterally with no wheezes/rales/rhonchi.  Normal respiratory effort. Abdomen:  soft, NT, ND Skin:  no rash or induration seen on limited exam Musculoskeletal:  grossly normal tone BUE/BLE,  good ROM, no bony abnormality Psychiatric:  blunted mood and affect, speech sparse with monosyllabic answers that are inconsistent Neurologic:  CN grossly intact on limited exam  Data Reviewed: I have reviewed the patient's lab results since admission.  Pertinent labs for today include:  CO2 19 Glucose 107 BUN 18/Creatinine 0.98/GFR 59 - stable Urine culture 90k colonies E coli, resistant to Cipro, Bactrim     Family Communication: Daughter was present throughout evaluation  Disposition: Status is: Inpatient Remains inpatient appropriate because: unsafe disposition     Time spent: 35 minutes  Unresulted Labs (From admission, onward)    None        Author: Jonah Blue, MD 11/12/2023 7:42 AM  For on call review [email or web address].

## 2023-11-12 NOTE — Plan of Care (Signed)

## 2023-11-12 NOTE — Consult Note (Addendum)
Palliative Medicine Inpatient Consult Note  Consulting Provider:  Jonah Blue, MD   Reason for consult:   Palliative Care Consult Services Palliative Medicine Consult  Reason for Consult? goals of care   11/12/2023  HPI:  Per intake H&P --> 77yo with h/o dementia, HTN, and COPD who presented on 11/16 with a fall at home, was found to have a L-sided SDH and SAH. Neurosurgery consulted and recommended a repeat CT that showed enlarging SDH which has stabilized. She was started on Keppra with goal SBP <150. PT recommends SNF rehab.   Palliative care has been asked to get involved to continue goals of care conversation.    Clinical Assessment/Goals of Care:  *Please note that this is a verbal dictation therefore any spelling or grammatical errors are due to the "Dragon Medical One" system interpretation.  I have reviewed medical records including EPIC notes, labs and imaging, received report from bedside RN, assessed the patient who is sitting up in bed eating eggs.    I met with patients daughter, [name] to further discuss diagnosis prognosis, GOC, EOL wishes, disposition and options.   I introduced Palliative Medicine as specialized medical care for people living with serious illness. It focuses on providing relief from the symptoms and stress of a serious illness. The goal is to improve quality of life for both the patient and the family.  Medical History Review and Understanding:  Patient's past medical history significant for COPD, hypertension, dementia of unknown type, fall, and recent seizure was held.  Social History:  Tone is lives in [place].  She has been married 3 times though all of her husband's have passed away.  She has 5 children though one of her daughters passed away this past 2023/05/26.  She has a grandchildren, 73 great-grandchildren, and 3 great great grandchildren.  She formally worked at [employer] for many years and then worked  at [employer]" where she retired from.  She is a woman of [employer] faith though per her daughter does not attend regular masses.  Functional and Nutritional State:  Preceding hospitalization Kory was living in an apartment.  Her granddaughter, Alsion Malley spends the nights with her.  She receives Meals on Wheels during the day.  She has been able to mobilize without any assistive devices.  Her granddaughter helps her with things like bathing, dressing, pill preparation.   Patient's daughter did try to get a home health aide though this was not passed by her insurance.  Advance Directives:  A detailed discussion was had today regarding advanced directives.  Patient's daughter shares she is the healthcare power of attorney - these documents have been identified in the chart and \\will  be scanned into Vynca.  Code Status:  Concepts specific to code status, artifical feeding and hydration, continued IV antibiotics and rehospitalization was had.  The difference between a aggressive medical intervention path  and a palliative comfort care path for this patient at this time was had.   A MOST for was introduce for review and completion.  Provided  "Hard Choices for [employer]" booklet.   Patient's daughter shares awareness of cardiopulmonary resuscitation and possible trauma associated with it.  [name] however does feel, if his situation occurs which is improvable she would want to pursue that. If Katie Jackson were at a situation where her long-term outlooks were poor then [name] would address Katie at that time. She shares that bother her grandparents died in the hospital when they declined in the setting(s)  of advanced cancer.   At this time [name] would like to keep her mother is a full code.  Discussion:  An open and honest conversation was held in the setting of Chea's dementia.  We reviewed together the various types of dementia inclusive of Lewy body dementia, Alzheimer's type dementia,  and vascular type dementia.  Per patient's daughter she never had a formal type of dementia identified.  We discussed that since about 2017 it was noted that Katie Jackson his memory had began to fail.  Neighbors would comment on her driving.  She was more forgetful.  Eventually it was identified that she had cognitive impairment/dementia.  We reviewed the common trajectory of Alzheimer's type dementia.  We discussed that this is often terminal and that the secondary effects of having dementia can often end up taking patients lives.  I provided examples such as urinary tract infections, aspiration pneumonias, decubitus pressure injuries.  At this point in time Lumen has remained functional mobile and has remained continent.  She was eating and drinking in her home environment. She had limited vocabulary and would only say "no" or "damn". Her daughter shares since stopping some of her medications such as Aricept her vocabulary has expanded.   We reviewed the circumstances of Ahmaya' fall at her daughter's house.  We discussed the possible short-term and long-term effects of having a subdural hematoma/subarachnoid hemorrhage.  We reviewed that patient may reach a new baseline level of function.  Provided time for [name] to share her grievances associated with her mother's care inclusive of the medication she has received for sedation. [name] expresses that the only medication she takes at home to help with her "sundowning" is Seroquel 25 mg at 8 PM.  This is utilized incrementally in the home environment.  Reviewed prior infections that patient has had inclusive of recurrent UTIs.   We discussed patients keppra and how this too may be making the patient more sleepy.   Patients daughter shares likely needs in the future inclusive of home care aide, assistance with diapers, and she does share openness to having OP Palliative support.   Discussed the importance of continued conversation with family and their   medical providers regarding overall plan of care and treatment options, ensuring decisions are within the context of the patients values and GOCs.  Decision Maker: MOORE, RONDA (POA) (Daughter): [phone] (Mobile)   SUMMARY OF RECOMMENDATIONS   Full Code/ Full scope of care  Discussions held regarding dementia and progression of disease  OP Palliative support on discharge  Delirium: - Patient responds well at home to Seroquel - recommend 25mg  PO Daily - Would veer away from additional antipsychotics given sedative properties unless absolutely needed - Dc'd haldol - Attempt tucking bed sheets in around patient - Offer calming music on Youtube - Patient is a good "tele sitter" candidate if no physical sitter is available though this would preclude discharge w/in 24 hours  Falls: - Bed alarm - Bed-side cushions - Ensure appropriate fitting shoes with good grip - Ensure glasses and hearing aids are in place  Polypharmacy: - Appreciate Clinical pharmacist - PharmD- [place], speaking to patient about medications - Try to decrease pill burden as able  Social: - Appreciate MSW team helping daughter to arrange home health aide - Resources for diaper(s) - Placement at SNF  Ongoing PMT support  Code Status/Advance Care Planning: FULL CODE   Palliative Prophylaxis:  Aspiration, Bowel Regimen, Delirium Protocol, Frequent Pain Assessment, Oral Care, Palliative Wound Care, and Turn  Reposition  Additional Recommendations (Limitations, Scope, Preferences): Continue current care  Psycho-social/Spiritual:  Desire for further Chaplaincy support: Yes Additional Recommendations: Education on dementia   Prognosis: Multiple chronic co-morbidities, recurrent hospitalizations, high 12 month mortality risk.   Discharge Planning: Discharge to SNF once medically optimized  Vitals:   11/11/23 2313 11/12/23 0346  BP: 127/66 125/61  Pulse: 96 89  Resp: 18 18  Temp: 97.9 F (36.6 C) 98  F (36.7 C)  SpO2:  96%    Intake/Output Summary (Last 24 hours) at 11/12/2023 1610 Last data filed at 11/12/2023 0400 Gross per 24 hour  Intake 100 ml  Output --  Net 100 ml   Last Weight  Most recent update: 11/04/2023  6:01 PM    Weight  59.9 kg (132 lb)            Gen:  Frail elderly Caucasian F in NAD HEENT: moist mucous membranes CV: Regular rate and rhythm  PULM: On RA, breathing is even and nonlabored ABD: soft/nontender  EXT: No edema  Neuro: Opens eyes, minimally vocal  PPS: 40%   This conversation/these recommendations were discussed with patient primary care team, Dr. Ophelia Charter   Total Time: 120 Billing based on MDM: High  Problems Addressed: One acute or chronic illness or injury that poses a threat to life or bodily function  Amount and/or Complexity of Data: Category 3:Discussion of management or test interpretation with external physician/other qualified health care professional/appropriate source (not separately reported)  Risks: Drug therapy requiring intensive monitoring for toxicity, Decision regarding hospitalization or escalation of hospital care, and Decision not to resuscitate or to de-escalate care because of poor prognosis ______________________________________________________ Lamarr Lulas [place] Palliative Medicine Team Team Cell Phone: [phone] Please utilize secure chat with additional questions, if there is no response within 30 minutes please call the above phone number  Palliative Medicine Team providers are available by phone from 7am to 7pm daily and can be reached through the team cell phone.  Should this patient require assistance outside of these hours, please call the patient's attending physician.

## 2023-11-13 DIAGNOSIS — Z7189 Other specified counseling: Secondary | ICD-10-CM | POA: Diagnosis not present

## 2023-11-13 DIAGNOSIS — S065XAA Traumatic subdural hemorrhage with loss of consciousness status unknown, initial encounter: Secondary | ICD-10-CM | POA: Diagnosis not present

## 2023-11-13 DIAGNOSIS — Z515 Encounter for palliative care: Secondary | ICD-10-CM | POA: Diagnosis not present

## 2023-11-13 NOTE — Plan of Care (Signed)
Problem: Education: Goal: Knowledge of General Education information will improve Description: Including pain rating scale, medication(s)/side effects and non-pharmacologic comfort measures Outcome: Not Progressing   Problem: Health Behavior/Discharge Planning: Goal: Ability to manage health-related needs will improve Outcome: Progressing   Problem: Clinical Measurements: Goal: Ability to maintain clinical measurements within normal limits will improve Outcome: Progressing

## 2023-11-13 NOTE — Progress Notes (Signed)
Palliative Medicine Inpatient Follow Up Note HPI: 77yo with h/o dementia, HTN, and COPD who presented on 11/16 with a fall at home, was found to have a L-sided SDH and SAH. Neurosurgery consulted and recommended a repeat CT that showed enlarging SDH which has stabilized. She was started on Keppra with goal SBP <150. PT recommends SNF rehab.    Palliative care has been asked to get involved to continue goals of care conversation.   Today's Discussion 11/13/2023  *Please note that this is a verbal dictation therefore any spelling or grammatical errors are due to the "Dragon Medical One" system interpretation.  Chart reviewed inclusive of vital signs, progress notes, laboratory results, and diagnostic images.   I met at bedside with Katie Jackson and her daughter, [name] this afternoon.   Katie Jackson is sitting up in the chair intermittently awakening to take a bite of her lunch then drifting back to sleep.  I spoke with Brandice' daughter about her current health state. [name] notes that she is much declined now as compared to before her admission from the fall. She shares understanding that often after a traumatic event patients can reach a new baseline level of physical and cognitive function. We reviewed that often trauma can accelerate dementia also.   Created space and opportunity for patients daughter to explore thoughts feelings and fears regarding her mothers current medical situation. She shares that she has been thinking about some of the things we discussed yesterday. We reviewed patients best case and worst case scenarios. In regards to the worst case we reviewed that despite the efforts of the medical team(s), Katie Jackson continues to decline. We reviewed that should this happen then is would appropriate to consider hospice services. This would be especially notable if patient remains to have little to no appetite. Best case scenario reviewed in regards to patients gaining her mobility and strength  back and able to transition back home.   Patients daughter remains agreeable to OP Palliative support. She does share that if she sees her mother is deteriorating she would determine to not resuscitate at that time. She shares that if this is the case she would not see the point in placing her mother through those efforts.   Plan for the time being is for [facility] to transition to skilled nursing. From there, [name] will observe if patient improves or declines. Further decisions will be made during that time.   Questions and concerns addressed/Palliative Support Provided.   Objective Assessment: Vital Signs Vitals:   11/13/23 0754 11/13/23 1210  BP: 102/85 122/65  Pulse: 97 100  Resp:  18  Temp: (!) 97.5 F (36.4 C) 98 F (36.7 C)  SpO2:  96%    Intake/Output Summary (Last 24 hours) at 11/13/2023 1350 Last data filed at 11/13/2023 0300 Gross per 24 hour  Intake 0 ml  Output --  Net 0 ml   Last Weight  Most recent update: 11/04/2023  6:01 PM    Weight  59.9 kg (132 lb)            Gen:  Frail elderly Caucasian F in NAD HEENT: moist mucous membranes CV: Regular rate and rhythm  PULM: On RA, breathing is even and nonlabored ABD: soft/nontender  EXT: No edema  Neuro: Opens eyes, minimally vocal  SUMMARY OF RECOMMENDATIONS   Full Code/ Full scope of care   Open and honest conversations held regarding best case and worst case scenarios   OP Palliative [facility] on discharge through [facility]   Delirium: - Patient responds well at home to Seroquel - recommend 25mg  PO As needed - Would veer away from additional antipsychotics given sedative properties unless absolutely needed - Attempt tucking bed sheets in around patient - Offer calming music on Youtube   Falls: - Bed alarm - Bed-side cushions - Ensure appropriate fitting shoes with good grip - Ensure glasses and hearing aids are in place   Polypharmacy: - Appreciate Clinical pharmacist education on  medications - Decrease pill burden as able   Social: - Appreciate MSW helping with placement at [facility]  Incremental PMT support  Billing based on MDM: High ______________________________________________________________________________________ Lamarr Lulas [place] Palliative Medicine Team Team Cell Phone: [phone] Please utilize secure chat with additional questions, if there is no response within 30 minutes please call the above phone number  Palliative Medicine Team providers are available by phone from 7am to 7pm daily and can be reached through the team cell phone.  Should this patient require assistance outside of these hours, please call the patient's attending physician.

## 2023-11-13 NOTE — Progress Notes (Signed)
Physical Therapy Treatment Patient Details Name: Katie Jackson MRN: [id number] DOB: 1946/05/16 Today's Date: 11/13/2023   History of Present Illness Katie Jackson is a 77 yo female who presented after a fall backwards off of steps.  CT head showed subdural hematoma enlarging from 9 mm to 12 mm. PMHx: Alzheimer's dementia with behavioral symptoms, HTN, HLD, CKD, COPD    PT Comments  Daughter present. Pt easily awakened (daughter reports she has been sleepy again today and that RN reported no sedating meds given). She follows gestures and some verbal commands. She required mod assist with bed mobility, min assist to stand and min assist +1 for IV to ambulate 180 ft with RW vs Lt HHA. Patient normally walks independently without device per daughter. Currently has mild left lean and left drift requiring assist. Did well with RW, however needed assist to maneuver when turning. ?pt would remember to use RW.     If plan is discharge home, recommend the following: Assistance with cooking/housework;Direct supervision/assist for medications management;Direct supervision/assist for financial management;Assist for transportation;Help with stairs or ramp for entrance;Supervision due to cognitive status;Two people to help with walking and/or transfers;Assistance with feeding   Can travel by private vehicle     No  Equipment Recommendations  None recommended by PT    Recommendations for Other Services       Precautions / Restrictions Precautions Precautions: Fall Precaution Comments: another fall 6 months ago Restrictions Weight Bearing Restrictions: No     Mobility  Bed Mobility Overal bed mobility: Needs Assistance Bed Mobility: Rolling, Sidelying to Sit Rolling: Total assist Sidelying to sit: Mod assist       General bed mobility comments: pt not following instructions/cues for rolling to initiate OOB;  assisted with moving legs over EOB and then pt assisted with raising her  torso    Transfers Overall transfer level: Needs assistance Equipment used: Rolling walker (2 wheels) Transfers: Sit to/from Stand Sit to Stand: Min assist, +2 safety/equipment           General transfer comment: from EOB and recliner; min steadying assist; max cues for sequencing with RW    Ambulation/Gait Ambulation/Gait assistance: Min assist, +2 safety/equipment Gait Distance (Feet): 180 Feet Assistive device: Rolling walker (2 wheels), 1 person hand held assist Gait Pattern/deviations: Step-through pattern, Decreased stride length, Drifts right/left, Narrow base of support   Gait velocity interpretation: 1.31 - 2.62 ft/sec, indicative of limited community ambulator   General Gait Details: initially with RW with straight path with min assist to maneuver RW; final 20 ft removed RW and pt required min assist with L HHA with pt requiring steadying due to left lean/drift   Stairs             Wheelchair Mobility     Tilt Bed    Modified Rankin (Stroke Patients Only)       Balance Overall balance assessment: Needs assistance Sitting-balance support: Feet unsupported, Single extremity supported, Bilateral upper extremity supported, No upper extremity supported Sitting balance-Leahy Scale: Fair Sitting balance - Comments: able to maintain with supervision for safety   Standing balance support: During functional activity, Single extremity supported Standing balance-Leahy Scale: Poor Standing balance comment: single UE support                            Cognition Arousal: Alert Behavior During Therapy: Flat affect Overall Cognitive Status: History of cognitive impairments - at baseline  General Comments: smiling appropriately; following most gestures and simple commands; yells out if she doesn't like something (per daughter);        Exercises      General Comments General comments (skin integrity,  edema, etc.): Daughter present. Reports pt has never used a RW despite their attempts to have her use one. She has a rollator at home that she would sometimes use when in community (and sit when tired).      Pertinent Vitals/Pain Pain Assessment Pain Assessment: Faces Faces Pain Scale: No hurt    Home Living                          Prior Function            PT Goals (current goals can now be found in the care plan section) Acute Rehab PT Goals Patient Stated Goal: daughter wants her to be able to ambulate independently without a device PT Goal Formulation: With family Time For Goal Achievement: 11/20/23 Potential to Achieve Goals: Fair Progress towards PT goals: Progressing toward goals (ambulation goal updated)    Frequency    Min 1X/week      PT Plan      Co-evaluation              AM-PAC PT "6 Clicks" Mobility   Outcome Measure  Help needed turning from your back to your side while in a flat bed without using bedrails?: Total Help needed moving from lying on your back to sitting on the side of a flat bed without using bedrails?: A Lot Help needed moving to and from a bed to a chair (including a wheelchair)?: A Little Help needed standing up from a chair using your arms (e.g., wheelchair or bedside chair)?: A Little Help needed to walk in hospital room?: A Little Help needed climbing 3-5 steps with a railing? : A Lot 6 Click Score: 14    End of Session Equipment Utilized During Treatment: Gait belt Activity Tolerance: Patient tolerated treatment well Patient left: with call bell/phone within reach;in chair;with chair alarm set;with family/visitor present (waist belt alarm; RN ok'd no mittens as daughter requested and is present) Nurse Communication: Other (comment);Mobility status (dtr requesting no mittens while she is present) PT Visit Diagnosis: Muscle weakness (generalized) (M62.81);Repeated falls (R29.6);Other symptoms and signs involving the  nervous system (R29.898)     Time: 1610-9604 PT Time Calculation (min) (ACUTE ONLY): 23 min  Charges:    $Gait Training: 8-22 mins $Therapeutic Activity: 8-22 mins PT General Charges $$ ACUTE PT VISIT: 1 Visit                      Jerolyn Center, PT Acute Rehabilitation Services  Office [phone]    Zena Amos 11/13/2023, 12:26 PM

## 2023-11-13 NOTE — Progress Notes (Signed)
Progress Note   Patient: Katie Jackson BJY:[id number] DOB: 01-Nov-1946 DOA: 11/04/2023     9 DOS: the patient was seen and examined on 11/13/2023   Brief hospital course: 77yo with h/o dementia, HTN, and COPD who presented on 11/16 with a fall at home, was found to have a L-sided SDH and SAH.  Neurosurgery consulted and recommended a repeat CT that showed enlarging SDH which has stabilized.  She was started on Keppra with goal SBP <150.  PT recommends SNF rehab.  Assessment and Plan:  Traumatic left-sided SDH (subdural hematoma) and subarachnoid hemorrhage SDH/SAH present on admission, repeat CT scan showing enlargement of SDH; repeated CT scan 11/05/2023 shows stabilization Neurosurgery was involved who recommended to start her on Keppra twice a day x 7 days Also recommend to try to keep systolic blood pressure less than 150 Neurosurgery recommended no intervention and repeat a CT scan in 2 weeks and follow-up with the clinic thereafter She is at risk of chronic subdural hematoma PT evaluated the patient, will need skilled nursing facility Consulted palliative care   Dementia with behavioral disturbances There was some concern for acute metabolic encephalopathy but this appears to be at baseline Seroquel makes her hypersomnolent so daughter was only giving as needed at home Given Seroquel + Haldol + Ativan for agitation which led to significant hypersomnolence so now her daughter wants avoidance of all medications - but this is challenging because she gets quite agitated when family is not present Avoid Ativan Delirium precautions Order for Prn Haldol for agitation - family prefers that we avoid this Discussing use of Seroquel but she is likely to do better if this is given before agitation starts with sundowning Palliative care assistance is greatly appreciated Patient will need no sitter or restraints (including chemical) for at least 24 hours prior to discharge to SNF -  currently meets this criteria   Essential hypertension Continue prn metoprolol  Goal blood pressure less than 150   Chronic kidney disease stage IIIa Appears to be stable at this time Attempt to avoid nephrotoxic medications   UTI Vs. Colonization Given concern for AMS and agitation, will treat with Ceftriaxone x 3 days        Consultants: Neurosurgery PT OT SLP [facility] team   Procedures: None   Antibiotics: None 30 Day Unplanned Readmission Risk Score    Flowsheet Row ED to Hosp-Admission (Current) from 11/04/2023 in [facility]  30 Day Unplanned Readmission Risk Score (%) 15.22 Filed at 11/13/2023 0401       This score is the patient's risk of an unplanned readmission within 30 days of being discharged (0 -100%). The score is based on dignosis, age, lab data, medications, orders, and past utilization.   Low:  0-14.9   Medium: 15-21.9   High: 22-29.9   Extreme: 30 and above           Subjective: Patient was more somnolent today but did not get medications overnight.  Daughter encouraged to open blinds, turn on lights, and get patient up to eat.   Objective: Vitals:   11/13/23 0754 11/13/23 1210  BP: 102/85 122/65  Pulse: 97 100  Resp:  18  Temp: (!) 97.5 F (36.4 C) 98 F (36.7 C)  SpO2:  96%    Intake/Output Summary (Last 24 hours) at 11/13/2023 1605 Last data filed at 11/13/2023 0300 Gross per 24 hour  Intake 0 ml  Output --  Net 0 ml   [employer]  11/04/23 1801  Weight: 59.9 kg    Exam:  General:  Appears calm and comfortable and is in NAD, somnolent but not engaged when awake Eyes:  EOMI, normal lids, iris ENT:  grossly normal hearing, lips & tongue, mmm Cardiovascular:  RRR, no m/r/g. No LE edema.  Respiratory:   CTA bilaterally with no wheezes/rales/rhonchi.  Normal respiratory effort. Abdomen:  soft, NT, ND Skin:  no rash or induration seen on limited exam Musculoskeletal:  grossly normal tone BUE/BLE, good  ROM, no bony abnormality Psychiatric:  nonverbal today Neurologic:  CN grossly intact on limited exam  Data Reviewed: I have reviewed the patient's lab results since admission.  Pertinent labs for today include:   None today     Family Communication: Daughter was present throughout evaluation  Disposition: Status is: Inpatient Remains inpatient appropriate because: unsafe disposition     Time spent: 35 minutes  Unresulted Labs (From admission, onward)     Start     Ordered   Unscheduled  CBC with Differential/Platelet  Tomorrow morning,   R       Question:  Specimen collection method  Answer:  Lab=Lab collect   11/13/23 1605   Unscheduled  Basic metabolic panel  Tomorrow morning,   R       Question:  Specimen collection method  Answer:  Lab=Lab collect   11/13/23 1605             Author: Jonah Blue, MD 11/13/2023 4:05 PM  For on call review [email or web address].

## 2023-11-13 NOTE — TOC Progression Note (Signed)
Transition of Care [facility]) - Progression Note    Patient Details  Name: Katie Jackson MRN: [id number] Date of Birth: [birth year]  Transition of Care [facility]) CM/SW Contact  Baldemar Lenis, [place] Phone Number: 11/13/2023, 3:48 PM  Clinical Narrative:   CSW contacted [facility] about reviewing referral, they said they would review and update CSW with decision. [facility] later updated that they are unable to offer a bed for patient. CSW provided updated list of bed offers to daughter, Katie Jackson. Katie Jackson is not interested in [facility], would like to [facility] and see if they would offer or not. CSW discussed with Katie Jackson about looking further out or taking the patient home as other options, and Katie Jackson asked for time to think about it and discuss again tomorrow. CSW to follow.    Expected Discharge Plan: Skilled Nursing Facility Barriers to Discharge: Continued Medical Work up  Expected Discharge Plan and Services   Discharge Planning Services: CM Consult Post Acute Care Choice: Home Health Living arrangements for the past 2 months: Apartment                           HH Arranged: PT, OT, Social Work, Advertising account executive HH Agency: [facility] Date [facility] Agency Contacted: 11/06/23   Representative spoke with at [facility] Agency: Tresa Endo   Social Determinants of Health (SDOH) Interventions SDOH Screenings   Food Insecurity: Patient Unable To Answer (11/04/2023)  Housing: High Risk (11/04/2023)  Transportation Needs: Patient Unable To Answer (11/04/2023)  Utilities: Patient Unable To Answer (11/04/2023)  Tobacco Use: Medium Risk (11/04/2023)    Readmission Risk Interventions    06/16/2023    9:49 AM 04/24/2023    4:40 PM  Readmission Risk Prevention Plan  Post Dischage Appt  Complete  Medication Screening  Complete  Transportation Screening Complete Complete  PCP or Specialist Appt within 5-7 Days Complete   Home Care Screening Complete    Medication Review (RN CM) Complete

## 2023-11-13 NOTE — Progress Notes (Signed)
Occupational Therapy Treatment Patient Details Name: Katie Jackson MRN: [id number] DOB: 1946-04-20 Today's Date: 11/13/2023   History of present illness Katie Jackson is a 77 yo female who presented after a fall backwards off of steps.  CT head showed subdural hematoma enlarging from 9 mm to 12 mm. PMHx: Alzheimer's dementia with behavioral symptoms, HTN, HLD, CKD, COPD   OT comments  Pt is making steady progress towards their acute OT goals. Upon arrival pt was resting but easy to rouse. Overall she needed up to max A fro bed with difficulty follow commands. Min A HHA needed for all transfers and functional mobility due to unsteady gait, attempted RW use pt pt just pushed RW out of her way. She continues to need total A for management of her diaper. OT to continue to follow acutely to facilitate progress towards established goals. Pt will continue to benefit from skilled inpatient follow up therapy, <3 hours/day.       If plan is discharge home, recommend the following:  A lot of help with walking and/or transfers;A lot of help with bathing/dressing/bathroom;Assistance with cooking/housework;Assistance with feeding;Direct supervision/assist for medications management;Direct supervision/assist for financial management;Assist for transportation;Help with stairs or ramp for entrance;Supervision due to cognitive status   Equipment Recommendations  None recommended by OT       Precautions / Restrictions Precautions Precautions: Fall Precaution Comments: another fall 6 months ago Restrictions Weight Bearing Restrictions: No       Mobility Bed Mobility Overal bed mobility: Needs Assistance Bed Mobility: Supine to Sit, Sit to Supine     Supine to sit: Max assist Sit to supine: Min assist   General bed mobility comments: difficulty following commands    Transfers Overall transfer level: Needs assistance Equipment used: Rolling walker (2 wheels) Transfers: Sit to/from  Stand Sit to Stand: Min assist           General transfer comment: min A for HHA for room and hallway navigation     Balance Overall balance assessment: Needs assistance Sitting-balance support: Feet unsupported, Single extremity supported, Bilateral upper extremity supported, No upper extremity supported Sitting balance-Leahy Scale: Fair     Standing balance support: During functional activity, Single extremity supported Standing balance-Leahy Scale: Poor                             ADL either performed or assessed with clinical judgement   ADL Overall ADL's : Needs assistance/impaired                         Toilet Transfer: Minimal assistance;Ambulation Toilet Transfer Details (indicate cue type and reason): simulated. HHA for transfers and mobility Toileting- Architect and Hygiene: Total assistance Toileting - Clothing Manipulation Details (indicate cue type and reason): total A for management of diaper     Functional mobility during ADLs: Moderate assistance;+2 for physical assistance;Minimal assistance;+2 for safety/equipment General ADL Comments: attempted RW use, pt pushes it aside. min A for HHA for balance    Extremity/Trunk Assessment Upper Extremity Assessment Upper Extremity Assessment: Generalized weakness   Lower Extremity Assessment Lower Extremity Assessment: Defer to PT evaluation        Vision   Vision Assessment?: No apparent visual deficits   Perception Perception Perception: Not tested   Praxis Praxis Praxis: Not tested    Cognition Arousal: Alert Behavior During Therapy: Flat affect Overall Cognitive Status: History of cognitive impairments - at  baseline                                 General Comments: smiling appropriately; following most gestures and simple commands; yells out if she doesn't like something (per daughter)              General Comments family present    Pertinent  Vitals/ Pain       Pain Assessment Pain Assessment: Faces Faces Pain Scale: No hurt Pain Intervention(s): Monitored during session   Frequency  Min 1X/week        Progress Toward Goals  OT Goals(current goals can now be found in the care plan section)  Progress towards OT goals: Progressing toward goals  Acute Rehab OT Goals Patient Stated Goal: unable OT Goal Formulation: With patient Time For Goal Achievement: 11/20/23 Potential to Achieve Goals: Fair ADL Goals Pt Will Perform Eating: with min assist;sitting Pt Will Perform Grooming: with mod assist;standing Pt Will Transfer to Toilet: with mod assist;ambulating;regular height toilet Additional ADL Goal #1: Pt will follow simple 1 step commands to complete ADLs with minimal repetition   AM-PAC OT "6 Clicks" Daily Activity     Outcome Measure   Help from another person eating meals?: A Lot Help from another person taking care of personal grooming?: A Lot Help from another person toileting, which includes using toliet, bedpan, or urinal?: A Lot Help from another person bathing (including washing, rinsing, drying)?: A Lot Help from another person to put on and taking off regular upper body clothing?: A Lot Help from another person to put on and taking off regular lower body clothing?: Total 6 Click Score: 11    End of Session Equipment Utilized During Treatment: Gait belt  OT Visit Diagnosis: Unsteadiness on feet (R26.81);Other abnormalities of gait and mobility (R26.89);Muscle weakness (generalized) (M62.81);History of falling (Z91.81)   Activity Tolerance Patient tolerated treatment well   Patient Left in bed;with call bell/phone within reach;with bed alarm set;with family/visitor present   Nurse Communication Mobility status        Time: 1420-1443 OT Time Calculation (min): 23 min  Charges: OT General Charges $OT Visit: 1 Visit OT Treatments $Self Care/Home Management : 8-22 mins $Therapeutic Activity:  8-22 mins  Derenda Mis, OTR/L Acute Rehabilitation Services Office [phone] Secure Chat Communication Preferred   Donia Pounds 11/13/2023, 3:35 PM

## 2023-11-14 DIAGNOSIS — S065XAA Traumatic subdural hemorrhage with loss of consciousness status unknown, initial encounter: Secondary | ICD-10-CM | POA: Diagnosis not present

## 2023-11-14 LAB — CBC WITH DIFFERENTIAL/PLATELET
Abs Immature Granulocytes: 0.04 10*3/uL (ref 0.00–0.07)
Basophils Absolute: 0.1 10*3/uL (ref 0.0–0.1)
Basophils Relative: 1 %
Eosinophils Absolute: 0.2 10*3/uL (ref 0.0–0.5)
Eosinophils Relative: 2 %
HCT: 34.5 % — ABNORMAL LOW (ref 36.0–46.0)
Hemoglobin: 11.2 g/dL — ABNORMAL LOW (ref 12.0–15.0)
Immature Granulocytes: 0 %
Lymphocytes Relative: 25 %
Lymphs Abs: 2.5 10*3/uL (ref 0.7–4.0)
MCH: 25.2 pg — ABNORMAL LOW (ref 26.0–34.0)
MCHC: 32.5 g/dL (ref 30.0–36.0)
MCV: 77.5 fL — ABNORMAL LOW (ref 80.0–100.0)
Monocytes Absolute: 0.9 10*3/uL (ref 0.1–1.0)
Monocytes Relative: 9 %
Neutro Abs: 6.2 10*3/uL (ref 1.7–7.7)
Neutrophils Relative %: 63 %
Platelets: 343 10*3/uL (ref 150–400)
RBC: 4.45 MIL/uL (ref 3.87–5.11)
RDW: 16.3 % — ABNORMAL HIGH (ref 11.5–15.5)
WBC: 9.8 10*3/uL (ref 4.0–10.5)
nRBC: 0 % (ref 0.0–0.2)

## 2023-11-14 LAB — BASIC METABOLIC PANEL
Anion gap: 8 (ref 5–15)
BUN: 16 mg/dL (ref 8–23)
CO2: 20 mmol/L — ABNORMAL LOW (ref 22–32)
Calcium: 9.2 mg/dL (ref 8.9–10.3)
Chloride: 105 mmol/L (ref 98–111)
Creatinine, Ser: 1.07 mg/dL — ABNORMAL HIGH (ref 0.44–1.00)
GFR, Estimated: 53 mL/min — ABNORMAL LOW (ref >60)
Glucose, Bld: 107 mg/dL — ABNORMAL HIGH (ref 70–99)
Potassium: 4 mmol/L (ref 3.5–5.1)
Sodium: 133 mmol/L — ABNORMAL LOW (ref 135–145)

## 2023-11-14 NOTE — Progress Notes (Signed)
Progress Note   Patient: Katie Jackson UMP:[id number] DOB: 09/28/1946 DOA: 11/04/2023     10 DOS: the patient was seen and examined on 11/14/2023   Brief hospital course: 77yo with h/o dementia, HTN, and COPD who presented on 11/16 with a fall at home, was found to have a L-sided SDH and SAH.  Neurosurgery consulted and recommended a repeat CT that showed enlarging SDH which has stabilized.  She was started on Keppra with goal SBP <150.  PT recommends SNF rehab.  Assessment and Plan:  Traumatic left-sided SDH (subdural hematoma) and subarachnoid hemorrhage SDH/SAH present on admission, repeat CT scan showing enlargement of SDH; repeated CT scan 11/05/2023 shows stabilization Neurosurgery was involved who recommended to start her on Keppra twice a day x 7 days Also recommend to try to keep systolic blood pressure less than 150 Neurosurgery recommended no intervention and repeat a CT scan in 2 weeks and follow-up with the clinic thereafter She is at risk of chronic subdural hematoma PT evaluated the patient, will need skilled nursing facility Consulted palliative care   Dementia with behavioral disturbances There was some concern for acute metabolic encephalopathy but this appears to be at baseline Seroquel makes her hypersomnolent so daughter was only giving as needed at home Given Seroquel + Haldol + Ativan for agitation which led to significant hypersomnolence so now her daughter wants avoidance of all medications - but this is challenging because she gets quite agitated when family is not present Avoid Ativan Delirium precautions Order for Prn Haldol for agitation - family prefers that we avoid this Discussing use of Seroquel but she is likely to do better if this is given before agitation starts with sundowning Palliative care assistance is greatly appreciated Patient will need no sitter or restraints (including chemical) for at least 24 hours prior to discharge to SNF -  currently meets this criteria   Essential hypertension Continue prn metoprolol  Goal blood pressure less than 150   Chronic kidney disease stage IIIa Appears to be stable at this time Attempt to avoid nephrotoxic medications   UTI Vs. Colonization Given concern for AMS and agitation, will treat with Ceftriaxone x 3 days        Consultants: Neurosurgery PT OT SLP [facility] team   Procedures: None   Antibiotics: Ceftriaxone 11/24-26   30 Day Unplanned Readmission Risk Score    Flowsheet Row ED to Hosp-Admission (Current) from 11/04/2023 in [facility]  30 Day Unplanned Readmission Risk Score (%) 15.45 Filed at 11/14/2023 0401       This score is the patient's risk of an unplanned readmission within 30 days of being discharged (0 -100%). The score is based on dignosis, age, lab data, medications, orders, and past utilization.   Low:  0-14.9   Medium: 15-21.9   High: 22-29.9   Extreme: 30 and above           Subjective: Somnolent this AM.  I spoke with her daughter later in the AM, when she was at the bedside - daughter is pleased that she is cleaned up, not talking much.  Daughter wants her to go to SNF rehab.   Objective: Vitals:   11/14/23 0735 11/14/23 1132  BP: (!) 157/89 (!) 145/67  Pulse: 100 (!) 103  Resp: 16 16  Temp: 99.1 F (37.3 C) 98.9 F (37.2 C)  SpO2: 96% 97%    Intake/Output Summary (Last 24 hours) at 11/14/2023 1220 Last data filed at 11/14/2023 1200 Gross per  24 hour  Intake 690.1 ml  Output 0 ml  Net 690.1 ml   Filed Weights   11/04/23 1801  Weight: 59.9 kg    Exam:  General:  Appears calm and comfortable and is in NAD, somnolent  Eyes:  EOMI, normal lids, iris ENT:  grossly normal hearing, lips & tongue, mmm Cardiovascular:  RRR, no m/r/g. No LE edema.  Respiratory:   CTA bilaterally with no wheezes/rales/rhonchi.  Normal respiratory effort. Abdomen:  soft, NT, ND Skin:  no rash or induration seen on  limited exam Musculoskeletal:  grossly normal tone BUE/BLE, good ROM, no bony abnormality Psychiatric:  nonverbal today Neurologic:  CN grossly intact on limited exam  Data Reviewed: I have reviewed the patient's lab results since admission.  Pertinent labs for today include:   Na++ 133 CO2 20 Glucose 107 BUN 16/Creatinine 1.07/GFR 53 - stable WBC 9.8 Hgb 11.2     Family Communication: None present; I spoke with her daughter by telephone  Disposition: Status is: Inpatient Remains inpatient appropriate because: awaiting SNF rehab     Time spent: 35 minutes  Unresulted Labs (From admission, onward)     Start     Ordered   11/15/23 0500  Ammonia  Tomorrow morning,   R       Question:  Specimen collection method  Answer:  Lab=Lab collect   11/14/23 1210   11/15/23 0500  Basic metabolic panel  Tomorrow morning,   R       Question:  Specimen collection method  Answer:  Lab=Lab collect   11/14/23 1210             Author: Jonah Blue, MD 11/14/2023 12:20 PM  For on call review [email or web address].

## 2023-11-15 DIAGNOSIS — S065XAA Traumatic subdural hemorrhage with loss of consciousness status unknown, initial encounter: Secondary | ICD-10-CM | POA: Diagnosis not present

## 2023-11-15 LAB — BASIC METABOLIC PANEL
Anion gap: 8 (ref 5–15)
BUN: 19 mg/dL (ref 8–23)
CO2: 22 mmol/L (ref 22–32)
Calcium: 9.1 mg/dL (ref 8.9–10.3)
Chloride: 104 mmol/L (ref 98–111)
Creatinine, Ser: 1.12 mg/dL — ABNORMAL HIGH (ref 0.44–1.00)
GFR, Estimated: 50 mL/min — ABNORMAL LOW (ref >60)
Glucose, Bld: 113 mg/dL — ABNORMAL HIGH (ref 70–99)
Potassium: 3.9 mmol/L (ref 3.5–5.1)
Sodium: 134 mmol/L — ABNORMAL LOW (ref 135–145)

## 2023-11-15 LAB — AMMONIA: Ammonia: 15 umol/L (ref 9–35)

## 2023-11-15 NOTE — Plan of Care (Signed)
Problem: Education: Goal: Knowledge of General Education information will improve Description: Including pain rating scale, medication(s)/side effects and non-pharmacologic comfort measures Outcome: Progressing   Problem: Clinical Measurements: Goal: Respiratory complications will improve Outcome: Progressing Goal: Cardiovascular complication will be avoided Outcome: Progressing   Problem: Activity: Goal: Risk for activity intolerance will decrease Outcome: Progressing   Problem: Nutrition: Goal: Adequate nutrition will be maintained Outcome: Progressing   Problem: Pain Management: Goal: General experience of comfort will improve Outcome: Progressing   Problem: Skin Integrity: Goal: Risk for impaired skin integrity will decrease Outcome: Progressing

## 2023-11-15 NOTE — TOC Progression Note (Signed)
Transition of Care [facility]) - Progression Note    Patient Details  Name: Katie Jackson MRN: [id number] Date of Birth: 1946/06/04  Transition of Care [facility]) CM/SW Contact  Baldemar Lenis, [place] Phone Number: 11/15/2023, 3:37 PM  Clinical Narrative:   CSW spoke with daughter, Bjorn Loser, to discuss SNF options. Daughter does not feel comfortable with patient coming home without some rehabilitation first, so she is in agreement with [facility]. CSW updated admissions with [facility], they will review bed availability. CSW to follow.    Expected Discharge Plan: Skilled Nursing Facility Barriers to Discharge: Continued Medical Work up, English as a second language teacher  Expected Discharge Plan and Services   Discharge Planning Services: CM Consult Post Acute Care Choice: Home Health Living arrangements for the past 2 months: Apartment                           HH Arranged: PT, OT, Social Work, Advertising account executive HH Agency: [facility] Date [facility] Agency Contacted: 11/06/23   Representative spoke with at [facility] Agency: Tresa Endo   Social Determinants of Health (SDOH) Interventions SDOH Screenings   Food Insecurity: Patient Unable To Answer (11/04/2023)  Housing: High Risk (11/04/2023)  Transportation Needs: Patient Unable To Answer (11/04/2023)  Utilities: Patient Unable To Answer (11/04/2023)  Tobacco Use: Medium Risk (11/04/2023)    Readmission Risk Interventions    06/16/2023    9:49 AM 04/24/2023    4:40 PM  Readmission Risk Prevention Plan  Post Dischage Appt  Complete  Medication Screening  Complete  Transportation Screening Complete Complete  PCP or Specialist Appt within 5-7 Days Complete   Home Care Screening Complete   Medication Review (RN CM) Complete

## 2023-11-15 NOTE — Progress Notes (Signed)
Physical Therapy Treatment Patient Details Name: Katie Jackson MRN: [id number] DOB: 19-Jul-1946 Today's Date: 11/15/2023   History of Present Illness Katie Jackson is a 77 yo female who presented after a fall backwards off of steps.  CT head showed subdural hematoma enlarging from 9 mm to 12 mm. PMHx: Alzheimer's dementia with behavioral symptoms, HTN, HLD, CKD, COPD    PT Comments  Pt up in chair on arrival, no family at bedside. Pt needing encouragement for participation, suspect due to communicate deficits, this PTA holding out hand and asking pt to walk and pt shaking this PTAs hand. With increased time pt able to follow ~50% of verbal cues and demonstrating gait for hallway distance with min A and HHA on L to steady. Pt able to maintain standing at sink for extended period for peri-care with CGA for safety. Pt up in chair at end of session with RN aware. Current plan remains appropriate to address deficits and maximize functional independence and decrease caregiver burden. Pt continues to benefit from skilled PT services to progress toward functional mobility goals.     If plan is discharge home, recommend the following: Assistance with cooking/housework;Direct supervision/assist for medications management;Direct supervision/assist for financial management;Assist for transportation;Help with stairs or ramp for entrance;Supervision due to cognitive status;Two people to help with walking and/or transfers;Assistance with feeding   Can travel by private vehicle     No  Equipment Recommendations  None recommended by PT    Recommendations for Other Services       Precautions / Restrictions Precautions Precautions: Fall Precaution Comments: another fall 6 months ago Restrictions Weight Bearing Restrictions: No     Mobility  Bed Mobility Overal bed mobility: Needs Assistance             General bed mobility comments: OOB in chair on arrival    Transfers Overall  transfer level: Needs assistance Equipment used: Rolling walker (2 wheels) Transfers: Sit to/from Stand Sit to Stand: Min assist           General transfer comment: min A for HHA to rise    Ambulation/Gait Ambulation/Gait assistance: Min assist Gait Distance (Feet): 120 Feet Assistive device: Rolling walker (2 wheels), 1 person hand held assist Gait Pattern/deviations: Step-through pattern, Decreased stride length, Drifts right/left, Narrow base of support Gait velocity: decr     General Gait Details: slow pace, min assist with L HHA with pt requiring steadying due to left lean/drift, very narrow BOS with pt unable to correct   Stairs             Wheelchair Mobility     Tilt Bed    Modified Rankin (Stroke Patients Only)       Balance Overall balance assessment: Needs assistance Sitting-balance support: Feet unsupported, Single extremity supported, Bilateral upper extremity supported, No upper extremity supported Sitting balance-Leahy Scale: Fair Sitting balance - Comments: able to maintain with supervision for safety   Standing balance support: During functional activity, Single extremity supported Standing balance-Leahy Scale: Poor Standing balance comment: single UE support                            Cognition Arousal: Alert Behavior During Therapy: Flat affect Overall Cognitive Status: History of cognitive impairments - at baseline  General Comments: smiling appropriately; following most gestures and simple commands        Exercises      General Comments General comments (skin integrity, edema, etc.): no family at bedside      Pertinent Vitals/Pain Pain Assessment Pain Assessment: Faces Faces Pain Scale: No hurt Pain Intervention(s): Monitored during session    Home Living                          Prior Function            PT Goals (current goals can now be found in  the care plan section) Acute Rehab PT Goals Patient Stated Goal: daughter wants her to be able to ambulate independently without a device PT Goal Formulation: With family Time For Goal Achievement: 11/20/23 Progress towards PT goals: Progressing toward goals    Frequency    Min 1X/week      PT Plan      Co-evaluation              AM-PAC PT "6 Clicks" Mobility   Outcome Measure  Help needed turning from your back to your side while in a flat bed without using bedrails?: Total Help needed moving from lying on your back to sitting on the side of a flat bed without using bedrails?: A Lot Help needed moving to and from a bed to a chair (including a wheelchair)?: A Little Help needed standing up from a chair using your arms (e.g., wheelchair or bedside chair)?: A Little Help needed to walk in hospital room?: A Little Help needed climbing 3-5 steps with a railing? : A Lot 6 Click Score: 14    End of Session Equipment Utilized During Treatment: Gait belt Activity Tolerance: Patient tolerated treatment well Patient left: with call bell/phone within reach;in chair Nurse Communication: Mobility status PT Visit Diagnosis: Muscle weakness (generalized) (M62.81);Repeated falls (R29.6);Other symptoms and signs involving the nervous system (R29.898)     Time: 4098-1191 PT Time Calculation (min) (ACUTE ONLY): 23 min  Charges:    $Gait Training: 8-22 mins $Therapeutic Activity: 8-22 mins PT General Charges $$ ACUTE PT VISIT: 1 Visit                     Jaszmine Navejas R. PTA Acute Rehabilitation Services Office: [phone]   Catalina Antigua 11/15/2023, 1:57 PM

## 2023-11-15 NOTE — Progress Notes (Signed)
[facility] PROGRESS NOTE    Progress Note  Katie Jackson  HYQ:[id number] DOB: 24-Feb-1946 DOA: 11/04/2023 PCP: Georgann Housekeeper, MD     Brief Narrative:   Katie Jackson is an 77 y.o. female past medical history significant for Alzheimer's dementia behavioral disturbance essential hypertension COPD brought into the ED after fall at home with the head showed left-sided subdural hematoma 9 mm size and subarachnoid hemorrhage no midline shift.  ED spoke with neurosurgery who recommended to repeat a CT scan that showed enlarging subdural hematoma.   Assessment/Plan:   Traumatic left-sided SDH (subdural hematoma) and subarachnoid hemorrhage: Repeated CT scan showing enlarging subdural hematoma repeated CT scan 11/05/2023 shows stabilized. Neurosurgery was involved who recommended to start him on Keppra twice a day x7. Also recommend to try to keep systolic blood pressure less than 150. Neurosurgery recommended no intervention and repeat a CT scan in 2 weeks and follow-up with the clinic thereafter.  She was at risk of chronic subdural hematoma. CT scan of the head showed improvement of her hematoma.  Mentation is stable. PT evaluated the patient, will need skilled nursing facility.  Acute metabolic encephalopathy Likely due to polypharmacy and subdural hematoma these were held and her mentation is improved.  Dementia with behavioral disturbances: Discontinue Seroquel, avoid Haldol and Ativan. Family is refusing medication. Patient will need no sitter or restraints including chemical for at least 24 hours prior to discharge to skilled nursing facility.  Essential hypertension: Continue metoprolol goal blood pressure less than 150.  Chronic kidney disease stage IIIa: His creatinine has remained at baseline.    DVT prophylaxis: scd Family Communication:daughter Status is: Inpatient Remains inpatient appropriate because: Traumatic subdural hematoma    Code  Status:     Code Status Orders  (From admission, onward)           Start     Ordered   11/04/23 2110  Full code  Continuous       Question:  By:  Answer:  Consent: discussion documented in EHR   11/04/23 2110           Code Status History     Date Active Date Inactive Code Status Order ID Comments User Context   06/12/2023 0016 06/16/2023 2132 Full Code [id number]  Charlsie Quest, MD ED   04/22/2023 0552 04/27/2023 2338 Full Code [id number]  Gery Pray, MD Inpatient   02/26/2020 0408 03/04/2020 0154 Full Code [id number]  Barnetta Chapel, MD ED         IV Access:   Peripheral IV   Procedures and diagnostic studies:   No results found.   Medical Consultants:   None.   Subjective:    Jacquenette Shone with today no complaints. Objective:    Vitals:   11/14/23 2319 11/14/23 2325 11/15/23 0335 11/15/23 0727  BP: (!) 161/65 (!) 140/66 110/63 (!) 152/77  Pulse: (!) 103  92 91  Resp: 17  18 17   Temp: 98.9 F (37.2 C)  98.5 F (36.9 C) 97.6 F (36.4 C)  TempSrc: Oral  Oral Oral  SpO2: 96%  94% 97%  Weight:      Height:       SpO2: 97 %   Intake/Output Summary (Last 24 hours) at 11/15/2023 0835 Last data filed at 11/15/2023 0030 Gross per 24 hour  Intake 688.1 ml  Output 0 ml  Net 688.1 ml   Filed Weights   11/04/23 1801  Weight: 59.9 kg    Exam: General  exam: In no acute distress. Respiratory system: Good air movement and clear to auscultation. Cardiovascular system: S1 & S2 heard, RRR. No JVD. Gastrointestinal system: Abdomen is nondistended, soft and nontender.  Extremities: No pedal edema. Skin: No rashes, lesions or ulcers Psychiatry: Judgement and insight appear normal. Mood & affect appropriate. Data Reviewed:    Labs: Basic Metabolic Panel: Recent Labs  Lab 11/12/23 0428 11/14/23 0457 11/15/23 0551  NA 134* 133* 134*  K 4.0 4.0 3.9  CL 106 105 104  CO2 19* 20* 22  GLUCOSE 107* 107* 113*  BUN 18 16 19   CREATININE  0.98 1.07* 1.12*  CALCIUM 9.0 9.2 9.1   GFR Estimated Creatinine Clearance: 33.7 mL/min (A) (by C-G formula based on SCr of 1.12 mg/dL (H)). Liver Function Tests: No results for input(s): "AST", "ALT", "ALKPHOS", "BILITOT", "PROT", "ALBUMIN" in the last 168 hours. No results for input(s): "LIPASE", "AMYLASE" in the last 168 hours. Recent Labs  Lab 11/09/23 0951 11/15/23 0551  AMMONIA 45* 15   Coagulation profile No results for input(s): "INR", "PROTIME" in the last 168 hours.  COVID-19 Labs  No results for input(s): "DDIMER", "FERRITIN", "LDH", "CRP" in the last 72 hours.  Lab Results  Component Value Date   SARSCOV2NAA NEGATIVE 04/21/2023   SARSCOV2NAA NEGATIVE 02/25/2020    CBC: Recent Labs  Lab 11/11/23 0841 11/14/23 0457  WBC 7.9 9.8  NEUTROABS 4.3 6.2  HGB 11.0* 11.2*  HCT 34.8* 34.5*  MCV 77.2* 77.5*  PLT 257 343   Cardiac Enzymes: No results for input(s): "CKTOTAL", "CKMB", "CKMBINDEX", "TROPONINI" in the last 168 hours. BNP (last 3 results) No results for input(s): "PROBNP" in the last 8760 hours. CBG: Recent Labs  Lab 11/11/23 1134  GLUCAP 98   D-Dimer: No results for input(s): "DDIMER" in the last 72 hours. Hgb A1c: No results for input(s): "HGBA1C" in the last 72 hours. Lipid Profile: No results for input(s): "CHOL", "HDL", "LDLCALC", "TRIG", "CHOLHDL", "LDLDIRECT" in the last 72 hours. Thyroid function studies: No results for input(s): "TSH", "T4TOTAL", "T3FREE", "THYROIDAB" in the last 72 hours.  Invalid input(s): "FREET3" Anemia work up: No results for input(s): "VITAMINB12", "FOLATE", "FERRITIN", "TIBC", "IRON", "RETICCTPCT" in the last 72 hours. Sepsis Labs: Recent Labs  Lab 11/11/23 0841 11/14/23 0457  WBC 7.9 9.8   Microbiology Recent Results (from the past 240 hour(s))  Urine Culture     Status: Abnormal   Collection Time: 11/07/23  3:54 AM   Specimen: Urine, Random  Result Value Ref Range Status   Specimen Description  URINE, RANDOM  Final   Special Requests   Final    NONE Reflexed from [id number] Performed at [facility], [address] [place], [place] [address]    Culture 90,000 COLONIES/mL ESCHERICHIA COLI (A)  Final   Report Status 11/09/2023 FINAL  Final   Organism ID, Bacteria ESCHERICHIA COLI (A)  Final      Susceptibility   Escherichia coli - MIC*    AMPICILLIN <=2 SENSITIVE Sensitive     CEFAZOLIN <=4 SENSITIVE Sensitive     CEFEPIME <=0.12 SENSITIVE Sensitive     CEFTRIAXONE <=0.25 SENSITIVE Sensitive     CIPROFLOXACIN >=4 RESISTANT Resistant     GENTAMICIN <=1 SENSITIVE Sensitive     IMIPENEM <=0.25 SENSITIVE Sensitive     NITROFURANTOIN <=16 SENSITIVE Sensitive     TRIMETH/SULFA >=320 RESISTANT Resistant     AMPICILLIN/SULBACTAM <=2 SENSITIVE Sensitive     PIP/TAZO <=4 SENSITIVE Sensitive ug/mL    * 90,000  COLONIES/mL ESCHERICHIA COLI     Medications:    lactulose  10 g Oral BID   mouth rinse  15 mL Mouth Rinse 4 times per day   Continuous Infusions:      LOS: 11 days   Marinda Elk  [facility]  11/15/2023, 8:35 AM

## 2023-11-15 NOTE — TOC Progression Note (Signed)
Transition of Care [facility]) - Progression Note    Patient Details  Name: Katie Jackson MRN: [id number] Date of Birth: 06-03-1946  [facility] of [facility]) CM/SW Contact  Baldemar Lenis, [place] Phone Number: 11/15/2023, 3:37 PM  Clinical Narrative:   CSW spoke with Admissions at [facility], they are working on their heat and still unsure about bed availability. CSW updated daughter, Katie Jackson, and she is in agreement. CSW to follow.    Expected Discharge Plan: Skilled Nursing Facility Barriers to Discharge: Continued Medical Work up, English as a second language teacher  Expected Discharge Plan and Services   Discharge Planning Services: CM Consult Post Acute Care Choice: Home Health Living arrangements for the past 2 months: Apartment                           HH Arranged: PT, OT, Social Work, Advertising account executive HH Agency: [facility] Date [facility] Agency Contacted: 11/06/23   Representative spoke with at [facility] Agency: Tresa Endo   Social Determinants of Health (SDOH) Interventions SDOH Screenings   Food Insecurity: Patient Unable To Answer (11/04/2023)  Housing: High Risk (11/04/2023)  Transportation Needs: Patient Unable To Answer (11/04/2023)  Utilities: Patient Unable To Answer (11/04/2023)  Tobacco Use: Medium Risk (11/04/2023)    Readmission Risk Interventions    06/16/2023    9:49 AM 04/24/2023    4:40 PM  Readmission Risk Prevention Plan  Post Dischage Appt  Complete  Medication Screening  Complete  Transportation Screening Complete Complete  PCP or Specialist Appt within 5-7 Days Complete   Home Care Screening Complete   Medication Review (RN CM) Complete

## 2023-11-15 NOTE — Progress Notes (Signed)
Initial Nutrition Assessment  DOCUMENTATION CODES:   Non-severe (moderate) malnutrition in context of chronic illness  INTERVENTION:  Continue diet order per SLP Carnation Breakfast Essentials TID, each packet mixed with 8 ounces of 2% milk provides 13 grams of protein and 260 calories. Magic cup TID with meals, each supplement provides 290 kcal and 9 grams of protein Request updated measured weight  NUTRITION DIAGNOSIS:   Moderate Malnutrition related to chronic illness (dementia) as evidenced by moderate fat depletion, moderate muscle depletion, severe muscle depletion  GOAL:   Patient will meet greater than or equal to 90% of their needs  MONITOR:   PO intake, Supplement acceptance, Labs  REASON FOR ASSESSMENT:   Consult Assessment of nutrition requirement/status  ASSESSMENT:   Pt admitted after a fall at home leading to SDH and SAH. PMH significant for Alzheimer's dementia, HTN, and COPD.  Pt disoriented x4. Unable to provide detailed nutrition related history.  No family present at time of visit to obtain dietary recall from.  PMT following. Plans to d/c to SNF with palliative serviced.   S/p SLP evaluation, oral holding consistent with baseline. Pt without s/s of aspiration and oral dysphagia consistent with baseline secondary to dementia. Recommend dysphagia 3 diet with staff assistance with self feeding/full supervision.   Meal completions: 11/21: 90% dinner 11/22: 20% breakfast, 10% lunch, 75% dinner 11/24: 25% lunch 11/26: 30% breakfast, 50% lunch 11/27: 40% breakfast  No updated weight on file since date of admit. Unable to assess for weight changes throughout admission. Will request updated weight.  PTA, pt's weight appears to have remained fairly stable between 58-61 kg.   Medications: lactulose BID  Labs: sodium 134, Cr 1.12, GFR 50  NUTRITION - FOCUSED PHYSICAL EXAM:  Flowsheet Row Most Recent Value  Orbital Region Moderate depletion  Upper Arm  Region Moderate depletion  Thoracic and Lumbar Region Moderate depletion  Buccal Region Mild depletion  Temple Region Mild depletion  Clavicle Bone Region Moderate depletion  Clavicle and Acromion Bone Region Moderate depletion  Scapular Bone Region Severe depletion  Dorsal Hand Severe depletion  Patellar Region Severe depletion  Anterior Thigh Region Severe depletion  Posterior Calf Region Severe depletion  Edema (RD Assessment) None  Hair Reviewed  Eyes Reviewed  Mouth Reviewed  Skin Reviewed  Nails Reviewed       Diet Order:   Diet Order             DIET DYS 3 Room service appropriate? No; Fluid consistency: Thin  Diet effective now                   EDUCATION NEEDS:   No education needs have been identified at this time  Skin:  Skin Assessment: Reviewed RN Assessment  Last BM:  11/27 (type 5 small)  Height:   Ht Readings from Last 1 Encounters:  11/04/23 5\' 3"  (1.6 m)    Weight:   Wt Readings from Last 1 Encounters:  11/04/23 59.9 kg   BMI:  Body mass index is 23.38 kg/m.  Estimated Nutritional Needs:   Kcal:  1400-1600  Protein:  70-85g  Fluid:  >/=1.5L  Drusilla Kanner, RDN, LDN Clinical Nutrition

## 2023-11-16 DIAGNOSIS — S065XAA Traumatic subdural hemorrhage with loss of consciousness status unknown, initial encounter: Secondary | ICD-10-CM | POA: Diagnosis not present

## 2023-11-16 DIAGNOSIS — E440 Moderate protein-calorie malnutrition: Secondary | ICD-10-CM | POA: Insufficient documentation

## 2023-11-16 DIAGNOSIS — Z515 Encounter for palliative care: Secondary | ICD-10-CM | POA: Diagnosis not present

## 2023-11-16 LAB — GLUCOSE, CAPILLARY: Glucose-Capillary: 124 mg/dL — ABNORMAL HIGH (ref 70–99)

## 2023-11-16 NOTE — Progress Notes (Signed)
[facility] PROGRESS NOTE    Progress Note  Katie Jackson  WUJ:[id number] DOB: 04/16/1946 DOA: 11/04/2023 PCP: Georgann Housekeeper, MD     Brief Narrative:   Katie Jackson is an 77 y.o. female past medical history significant for Alzheimer's dementia behavioral disturbance essential hypertension COPD brought into the ED after fall at home with the head showed left-sided subdural hematoma 9 mm size and subarachnoid hemorrhage no midline shift.  ED spoke with neurosurgery who recommended to repeat a CT scan that showed enlarging subdural hematoma.   Assessment/Plan:   Traumatic left-sided SDH (subdural hematoma) and subarachnoid hemorrhage: Repeated CT scan showing enlarging subdural hematoma repeated CT scan 11/05/2023 shows stabilized. Neurosurgery was involved who recommended to start him on Keppra twice a day x7, now she is off has completed her treatment. Also recommend to try to keep systolic blood pressure less than 150. Neurosurgery recommended no intervention and repeat a CT scan in 2 weeks and follow-up with the clinic thereafter.  She was at risk of chronic subdural hematoma. CT scan of the head showed improvement of her hematoma.  Mentation is stable. PT evaluated the patient, awaiting skilled nursing facility placement needs to be off restraints for 24 hours.  Acute metabolic encephalopathy Likely due to polypharmacy and subdural hematoma these were held and her mentation is improved.  Dementia with behavioral disturbances: Discontinue Seroquel, avoid Haldol and Ativan. Family is refusing medication. Patient will need no sitter or restraints including chemical for at least 24 hours prior to discharge to skilled nursing facility.  Essential hypertension: Continue metoprolol goal blood pressure less than 150.  Chronic kidney disease stage IIIa: His creatinine has remained at baseline.    DVT prophylaxis: scd Family Communication:daughter Status is:  Inpatient Remains inpatient appropriate because: Traumatic subdural hematoma    Code Status:     Code Status Orders  (From admission, onward)           Start     Ordered   11/04/23 2110  Full code  Continuous       Question:  By:  Answer:  Consent: discussion documented in EHR   11/04/23 2110           Code Status History     Date Active Date Inactive Code Status Order ID Comments User Context   06/12/2023 0016 06/16/2023 2132 Full Code [id number]  Charlsie Quest, MD ED   04/22/2023 0552 04/27/2023 2338 Full Code [id number]  Gery Pray, MD Inpatient   02/26/2020 0408 03/04/2020 0154 Full Code [id number]  Barnetta Chapel, MD ED         IV Access:   Peripheral IV   Procedures and diagnostic studies:   No results found.   Medical Consultants:   None.   Subjective:    [name] no complaints Objective:    Vitals:   11/15/23 2006 11/15/23 2332 11/16/23 0349 11/16/23 0511  BP: (!) 135/57 (!) 141/69 (!) 141/70   Pulse: 94 (!) 109 98   Resp: 18 18 18    Temp: 97.6 F (36.4 C) (!) 97.5 F (36.4 C) 97.6 F (36.4 C)   TempSrc: Oral Oral Oral   SpO2: 95% 91% 94%   Weight:    52.9 kg  Height:       SpO2: 94 %   Intake/Output Summary (Last 24 hours) at 11/16/2023 0816 Last data filed at 11/16/2023 0426 Gross per 24 hour  Intake 250 ml  Output --  Net 250 ml  Filed Weights   11/04/23 1801 11/15/23 1616 11/16/23 0511  Weight: 59.9 kg 54.2 kg 52.9 kg    Exam: General exam: In no acute distress. Respiratory system: Good air movement and clear to auscultation. Cardiovascular system: S1 & S2 heard, RRR. No JVD. Gastrointestinal system: Abdomen is nondistended, soft and nontender.  Extremities: No pedal edema. Skin: No rashes, lesions or ulcers Psychiatry: Judgement and insight appear normal. Mood & affect appropriate. Data Reviewed:    Labs: Basic Metabolic Panel: Recent Labs  Lab 11/12/23 0428 11/14/23 0457 11/15/23 0551   NA 134* 133* 134*  K 4.0 4.0 3.9  CL 106 105 104  CO2 19* 20* 22  GLUCOSE 107* 107* 113*  BUN 18 16 19   CREATININE 0.98 1.07* 1.12*  CALCIUM 9.0 9.2 9.1   GFR Estimated Creatinine Clearance: 33.7 mL/min (A) (by C-G formula based on SCr of 1.12 mg/dL (H)). Liver Function Tests: No results for input(s): "AST", "ALT", "ALKPHOS", "BILITOT", "PROT", "ALBUMIN" in the last 168 hours. No results for input(s): "LIPASE", "AMYLASE" in the last 168 hours. Recent Labs  Lab 11/09/23 0951 11/15/23 0551  AMMONIA 45* 15   Coagulation profile No results for input(s): "INR", "PROTIME" in the last 168 hours.  COVID-19 Labs  No results for input(s): "DDIMER", "FERRITIN", "LDH", "CRP" in the last 72 hours.  Lab Results  Component Value Date   SARSCOV2NAA NEGATIVE 04/21/2023   SARSCOV2NAA NEGATIVE 02/25/2020    CBC: Recent Labs  Lab 11/11/23 0841 11/14/23 0457  WBC 7.9 9.8  NEUTROABS 4.3 6.2  HGB 11.0* 11.2*  HCT 34.8* 34.5*  MCV 77.2* 77.5*  PLT 257 343   Cardiac Enzymes: No results for input(s): "CKTOTAL", "CKMB", "CKMBINDEX", "TROPONINI" in the last 168 hours. BNP (last 3 results) No results for input(s): "PROBNP" in the last 8760 hours. CBG: Recent Labs  Lab 11/11/23 1134  GLUCAP 98   D-Dimer: No results for input(s): "DDIMER" in the last 72 hours. Hgb A1c: No results for input(s): "HGBA1C" in the last 72 hours. Lipid Profile: No results for input(s): "CHOL", "HDL", "LDLCALC", "TRIG", "CHOLHDL", "LDLDIRECT" in the last 72 hours. Thyroid function studies: No results for input(s): "TSH", "T4TOTAL", "T3FREE", "THYROIDAB" in the last 72 hours.  Invalid input(s): "FREET3" Anemia work up: No results for input(s): "VITAMINB12", "FOLATE", "FERRITIN", "TIBC", "IRON", "RETICCTPCT" in the last 72 hours. Sepsis Labs: Recent Labs  Lab 11/11/23 0841 11/14/23 0457  WBC 7.9 9.8   Microbiology Recent Results (from the past 240 hour(s))  Urine Culture     Status: Abnormal    Collection Time: 11/07/23  3:54 AM   Specimen: Urine, Random  Result Value Ref Range Status   Specimen Description URINE, RANDOM  Final   Special Requests   Final    NONE Reflexed from [id number] Performed at [facility], [address], [place], [place] [address]    Culture 90,000 COLONIES/mL ESCHERICHIA COLI (A)  Final   Report Status 11/09/2023 FINAL  Final   Organism ID, Bacteria ESCHERICHIA COLI (A)  Final      Susceptibility   Escherichia coli - MIC*    AMPICILLIN <=2 SENSITIVE Sensitive     CEFAZOLIN <=4 SENSITIVE Sensitive     CEFEPIME <=0.12 SENSITIVE Sensitive     CEFTRIAXONE <=0.25 SENSITIVE Sensitive     CIPROFLOXACIN >=4 RESISTANT Resistant     GENTAMICIN <=1 SENSITIVE Sensitive     IMIPENEM <=0.25 SENSITIVE Sensitive     NITROFURANTOIN <=16 SENSITIVE Sensitive     TRIMETH/SULFA >=320 RESISTANT  Resistant     AMPICILLIN/SULBACTAM <=2 SENSITIVE Sensitive     PIP/TAZO <=4 SENSITIVE Sensitive ug/mL    * 90,000 COLONIES/mL ESCHERICHIA COLI     Medications:    lactulose  10 g Oral BID   mouth rinse  15 mL Mouth Rinse 4 times per day   Continuous Infusions:      LOS: 12 days   Marinda Elk  [facility]  11/16/2023, 8:16 AM

## 2023-11-16 NOTE — Progress Notes (Signed)
   Palliative Medicine Inpatient Follow Up Note HPI: 77yo with h/o dementia, HTN, and COPD who presented on 11/16 with a fall at home, was found to have a L-sided SDH and SAH. Neurosurgery consulted and recommended a repeat CT that showed enlarging SDH which has stabilized. She was started on Keppra with goal SBP <150. PT recommends SNF rehab.    Palliative care has been asked to get involved to continue goals of care conversation.   Today's Discussion 11/16/2023  *Please note that this is a verbal dictation therefore any spelling or grammatical errors are due to the "Dragon Medical One" system interpretation.  Chart reviewed inclusive of vital signs, progress notes, laboratory results, and diagnostic images. PO intake remains overall poor. Has not required medication(s) for agitation in the last 24 hours.  I spoke to patients RN, Katie Jackson who shares that the patient has been  stable though is not interested in eating or drinking despite it being offered.   I met at bedside with Katie Jackson this late afternoon. She was awake and noted to be pulling at her blankets. I provided towels for her to fold.   Patients daughter, Katie Jackson was not present at bedside.  Plan for SNF placement in the oncoming day(s) with OP Palliative support.  Questions and concerns addressed/Palliative Support Provided.   Objective Assessment: Vital Signs Vitals:   11/16/23 0833 11/16/23 1135  BP: (!) 137/57 133/64  Pulse: 89 92  Resp: 16 17  Temp:  97.7 F (36.5 C)  SpO2: 97% 98%    Intake/Output Summary (Last 24 hours) at 11/16/2023 1409 Last data filed at 11/16/2023 0830 Gross per 24 hour  Intake 175 ml  Output --  Net 175 ml   Last Weight  Most recent update: 11/16/2023  5:12 AM    Weight  52.9 kg (116 lb 10 oz)            Gen:  Frail elderly Caucasian F in NAD HEENT: moist mucous membranes CV: Regular rate and rhythm  PULM: On RA, breathing is even and nonlabored ABD: soft/nontender  EXT: No  edema  Neuro: Opens eyes, minimally vocal  SUMMARY OF RECOMMENDATIONS   Full Code/ Full scope of care    OP Palliative support on discharge through [facility]   Delirium: - Patient responds well at home to Seroquel - recommend 25mg  PO As needed - Would veer away from additional antipsychotics given sedative properties unless absolutely needed - Attempt tucking bed sheets in around patient - Offer calming music on Youtube   Falls: - Bed alarm - Bed-side cushions - Ensure appropriate fitting shoes with good grip - Ensure glasses and hearing aids are in place   Polypharmacy: - Appreciate Clinical pharmacist education on medications - Decrease pill burden as able   Social: - Appreciate MSW helping with placement at [facility]  Incremental PMT support  Time: 27 ______________________________________________________________________________________ Katie Jackson  Palliative Medicine Team Team Cell Phone: [phone] Please utilize secure chat with additional questions, if there is no response within 30 minutes please call the above phone number  Palliative Medicine Team providers are available by phone from 7am to 7pm daily and can be reached through the team cell phone.  Should this patient require assistance outside of these hours, please call the patient's attending physician.

## 2023-11-17 DIAGNOSIS — S065XAA Traumatic subdural hemorrhage with loss of consciousness status unknown, initial encounter: Secondary | ICD-10-CM | POA: Diagnosis not present

## 2023-11-17 DIAGNOSIS — Z515 Encounter for palliative care: Secondary | ICD-10-CM | POA: Diagnosis not present

## 2023-11-17 DIAGNOSIS — Z7189 Other specified counseling: Secondary | ICD-10-CM | POA: Diagnosis not present

## 2023-11-17 NOTE — Progress Notes (Signed)
[facility] PROGRESS NOTE    Progress Note  Katie Jackson  QMV:[id number] DOB: 30-Jun-1946 DOA: 11/04/2023 PCP: Georgann Housekeeper, MD     Brief Narrative:   Katie Jackson is an 77 y.o. female past medical history significant for Alzheimer's dementia behavioral disturbance essential hypertension COPD brought into the ED after fall at home with the head showed left-sided subdural hematoma 9 mm size and subarachnoid hemorrhage no midline shift.  ED spoke with neurosurgery who recommended to repeat a CT scan that showed enlarging subdural hematoma.  Assessment/Plan:   Traumatic left-sided SDH (subdural hematoma) and subarachnoid hemorrhage: Repeated CT scan showing enlarging subdural hematoma repeated CT scan 11/05/2023 shows stabilized. Neurosurgery was involved who recommended to start him on Keppra twice a day x7, now she is off has completed her treatment. Also recommend to try to keep systolic blood pressure less than 150. Neurosurgery recommended no intervention and repeat a CT scan in 2 weeks and follow-up with the clinic thereafter.  She was at risk of chronic subdural hematoma. CT scan of the head showed improvement of her hematoma.  Mentation is stable. PT evaluated the patient, awaiting skilled nursing facility placement needs to be off restraints for 24 hours.  Acute metabolic encephalopathy Likely due to polypharmacy and subdural hematoma these were held and her mentation is improved.  Dementia with behavioral disturbances: Discontinue Seroquel, avoid Haldol and Ativan. Family is refusing medication. Patient will need no sitter or restraints including chemical for at least 24 hours prior to discharge to skilled nursing facility.  Essential hypertension: Continue metoprolol goal blood pressure less than 150.  Chronic kidney disease stage IIIa: His creatinine has remained at baseline.    DVT prophylaxis: scd Family Communication:daughter Status is:  Inpatient Remains inpatient appropriate because: Traumatic subdural hematoma    Code Status:     Code Status Orders  (From admission, onward)           Start     Ordered   11/04/23 2110  Full code  Continuous       Question:  By:  Answer:  Consent: discussion documented in EHR   11/04/23 2110           Code Status History     Date Active Date Inactive Code Status Order ID Comments User Context   06/12/2023 0016 06/16/2023 2132 Full Code [id number]  Charlsie Quest, MD ED   04/22/2023 0552 04/27/2023 2338 Full Code [id number]  Gery Pray, MD Inpatient   02/26/2020 0408 03/04/2020 0154 Full Code [id number]  Barnetta Chapel, MD ED         IV Access:   Peripheral IV   Procedures and diagnostic studies:   No results found.   Medical Consultants:   None.   Subjective:    Katie Jackson no complaints Objective:    Vitals:   11/17/23 0006 11/17/23 0029 11/17/23 0408 11/17/23 0500  BP: (!) 101/47 (!) 145/68 (!) 147/66   Pulse: 81 90 89   Resp: 16  18   Temp: 98.5 F (36.9 C)  97.8 F (36.6 C)   TempSrc: Oral  Oral   SpO2: 93%  94%   Weight:    52.6 kg  Height:       SpO2: 94 %   Intake/Output Summary (Last 24 hours) at 11/17/2023 0859 Last data filed at 11/16/2023 2026 Gross per 24 hour  Intake 135 ml  Output --  Net 135 ml   Filed Weights   11/15/23 1616  11/16/23 0511 11/17/23 0500  Weight: 54.2 kg 52.9 kg 52.6 kg    Exam: General exam: In no acute distress. Respiratory system: Good air movement and clear to auscultation. Cardiovascular system: S1 & S2 heard, RRR. No JVD. Gastrointestinal system: Abdomen is nondistended, soft and nontender.  Extremities: No pedal edema. Skin: No rashes, lesions or ulcers Psychiatry: Judgement and insight appear normal. Mood & affect appropriate. Data Reviewed:    Labs: Basic Metabolic Panel: Recent Labs  Lab 11/12/23 0428 11/14/23 0457 11/15/23 0551  NA 134* 133* 134*  K 4.0 4.0 3.9   CL 106 105 104  CO2 19* 20* 22  GLUCOSE 107* 107* 113*  BUN 18 16 19   CREATININE 0.98 1.07* 1.12*  CALCIUM 9.0 9.2 9.1   GFR Estimated Creatinine Clearance: 33.7 mL/min (A) (by C-G formula based on SCr of 1.12 mg/dL (H)). Liver Function Tests: No results for input(s): "AST", "ALT", "ALKPHOS", "BILITOT", "PROT", "ALBUMIN" in the last 168 hours. No results for input(s): "LIPASE", "AMYLASE" in the last 168 hours. Recent Labs  Lab 11/15/23 0551  AMMONIA 15   Coagulation profile No results for input(s): "INR", "PROTIME" in the last 168 hours.  COVID-19 Labs  No results for input(s): "DDIMER", "FERRITIN", "LDH", "CRP" in the last 72 hours.  Lab Results  Component Value Date   SARSCOV2NAA NEGATIVE 04/21/2023   SARSCOV2NAA NEGATIVE 02/25/2020    CBC: Recent Labs  Lab 11/11/23 0841 11/14/23 0457  WBC 7.9 9.8  NEUTROABS 4.3 6.2  HGB 11.0* 11.2*  HCT 34.8* 34.5*  MCV 77.2* 77.5*  PLT 257 343   Cardiac Enzymes: No results for input(s): "CKTOTAL", "CKMB", "CKMBINDEX", "TROPONINI" in the last 168 hours. BNP (last 3 results) No results for input(s): "PROBNP" in the last 8760 hours. CBG: Recent Labs  Lab 11/11/23 1134 11/16/23 2128  GLUCAP 98 124*   D-Dimer: No results for input(s): "DDIMER" in the last 72 hours. Hgb A1c: No results for input(s): "HGBA1C" in the last 72 hours. Lipid Profile: No results for input(s): "CHOL", "HDL", "LDLCALC", "TRIG", "CHOLHDL", "LDLDIRECT" in the last 72 hours. Thyroid function studies: No results for input(s): "TSH", "T4TOTAL", "T3FREE", "THYROIDAB" in the last 72 hours.  Invalid input(s): "FREET3" Anemia work up: No results for input(s): "VITAMINB12", "FOLATE", "FERRITIN", "TIBC", "IRON", "RETICCTPCT" in the last 72 hours. Sepsis Labs: Recent Labs  Lab 11/11/23 0841 11/14/23 0457  WBC 7.9 9.8   Microbiology No results found for this or any previous visit (from the past 240 hour(s)).    Medications:    lactulose  10 g  Oral BID   mouth rinse  15 mL Mouth Rinse 4 times per day   Continuous Infusions:      LOS: 13 days   Marinda Elk  [facility]  11/17/2023, 8:59 AM

## 2023-11-17 NOTE — TOC Progression Note (Signed)
Transition of Care [facility]) - Progression Note    Patient Details  Name: Katie Jackson MRN: [id number] Date of Birth: 05-05-1946  Transition of Care [facility]) CM/SW Contact  Deatra Robinson, [place] Phone Number: 11/17/2023, 2:35 PM  Clinical Narrative: per Palliative Medicine APP, pt's family requesting hospice home placement and prefer [facility] ([facility]). Referral made to Cheri with [facility]. SW will follow.   Dellie Burns, MSW, LCSW [phone] (coverage)        Expected Discharge Plan: Skilled Nursing Facility Barriers to Discharge: Continued Medical Work up, English as a second language teacher  Expected Discharge Plan and Services   Discharge Planning Services: CM Consult Post Acute Care Choice: Home Health Living arrangements for the past 2 months: Apartment                           HH Arranged: PT, OT, Social Work, Advertising account executive HH Agency: [facility] Date [facility] Agency Contacted: 11/06/23   Representative spoke with at [facility] Agency: Tresa Endo   Social Determinants of Health (SDOH) Interventions SDOH Screenings   Food Insecurity: Patient Unable To Answer (11/04/2023)  Housing: High Risk (11/04/2023)  Transportation Needs: Patient Unable To Answer (11/04/2023)  Utilities: Patient Unable To Answer (11/04/2023)  Tobacco Use: Medium Risk (11/04/2023)    Readmission Risk Interventions    06/16/2023    9:49 AM 04/24/2023    4:40 PM  Readmission Risk Prevention Plan  Post Dischage Appt  Complete  Medication Screening  Complete  Transportation Screening Complete Complete  PCP or Specialist Appt within 5-7 Days Complete   Home Care Screening Complete   Medication Review (RN CM) Complete

## 2023-11-17 NOTE — Progress Notes (Signed)
Report called to [facility]).

## 2023-11-17 NOTE — Progress Notes (Signed)
Occupational Therapy Treatment Patient Details Name: Katie Jackson MRN: [id number] DOB: 1946/06/26 Today's Date: 11/17/2023   History of present illness Katie Jackson is a 77 yo female who presented after a fall backwards off of steps.  CT head showed subdural hematoma enlarging from 9 mm to 12 mm. PMHx: Alzheimer's dementia with behavioral symptoms, HTN, HLD, CKD, COPD   OT comments  Pt is making limited progress towards their acute OT goals. Session was limited by lethargy and LOA. Pt was transferred to EOB with max A which improved arousal and she maintained eyes open. Pt did not functionally participate in face washing task despite grasping the rag. Pt stood 3x with mod A, but was unable to take steps away form the bed this date. OT to continue to follow acutely to facilitate progress towards established goals. Pt will continue to benefit from skilled inpatient follow up therapy, <3 hours/day.       If plan is discharge home, recommend the following:  A lot of help with walking and/or transfers;A lot of help with bathing/dressing/bathroom;Assistance with cooking/housework;Assistance with feeding;Direct supervision/assist for medications management;Direct supervision/assist for financial management;Assist for transportation;Help with stairs or ramp for entrance;Supervision due to cognitive status   Equipment Recommendations  None recommended by OT       Precautions / Restrictions Precautions Precautions: Fall Precaution Comments: another fall 6 months ago Restrictions Weight Bearing Restrictions: No       Mobility Bed Mobility Overal bed mobility: Needs Assistance Bed Mobility: Sit to Supine, Supine to Sit   Sidelying to sit: Max assist Supine to sit: Total assist          Transfers Overall transfer level: Needs assistance Equipment used: 1 person hand held assist Transfers: Sit to/from Stand Sit to Stand: Mod assist           General transfer  comment: 3x sit<>stands, unable to get pt to take steps away from the bed     Balance Overall balance assessment: Needs assistance Sitting-balance support: Feet unsupported, Single extremity supported, Bilateral upper extremity supported, No upper extremity supported Sitting balance-Leahy Scale: Fair     Standing balance support: During functional activity, Single extremity supported Standing balance-Leahy Scale: Poor                             ADL either performed or assessed with clinical judgement   ADL Overall ADL's : Needs assistance/impaired     Grooming: Maximal assistance Grooming Details (indicate cue type and reason): attempted face washing task at the EOB to increase arousal, pt did not participate in task despite grasping for the wash rag                 Toilet Transfer: Maximal assistance;Stand-pivot Toilet Transfer Details (indicate cue type and reason): increased assist for mobility needed today, like due to LOA         Functional mobility during ADLs: Moderate assistance General ADL Comments: lethargy limited session, unable to progress away from bed    Extremity/Trunk Assessment Upper Extremity Assessment Upper Extremity Assessment: Generalized weakness   Lower Extremity Assessment Lower Extremity Assessment: Defer to PT evaluation        Vision   Vision Assessment?: No apparent visual deficits   Perception Perception Perception: Not tested   Praxis Praxis Praxis: Not tested    Cognition Arousal: Lethargic Behavior During Therapy: Flat affect Overall Cognitive Status: History of cognitive impairments - at baseline  General Comments: more lethargic today, eyes open once sitting EOB but no real command following or attempt to participate in grooming despite maximal effort. Once returned supine pt immediately wen back to sleep              General Comments no family present    Pertinent Vitals/ Pain        Pain Assessment Pain Assessment: Faces Pain Score: 0-No pain Pain Intervention(s): Monitored during session   Frequency  Min 1X/week        Progress Toward Goals  OT Goals(current goals can now be found in the care plan section)  Progress towards OT goals: Progressing toward goals  Acute Rehab OT Goals Patient Stated Goal: unable OT Goal Formulation: With patient Time For Goal Achievement: 11/20/23 Potential to Achieve Goals: Fair ADL Goals Pt Will Perform Eating: with min assist;sitting Pt Will Perform Grooming: with mod assist;standing Pt Will Transfer to Toilet: with mod assist;ambulating;regular height toilet Additional ADL Goal #1: Pt will follow simple 1 step commands to complete ADLs with minimal repetition   AM-PAC OT "6 Clicks" Daily Activity     Outcome Measure   Help from another person eating meals?: A Lot Help from another person taking care of personal grooming?: A Lot Help from another person toileting, which includes using toliet, bedpan, or urinal?: A Lot Help from another person bathing (including washing, rinsing, drying)?: A Lot Help from another person to put on and taking off regular upper body clothing?: A Lot Help from another person to put on and taking off regular lower body clothing?: Total 6 Click Score: 11    End of Session Equipment Utilized During Treatment: Gait belt  OT Visit Diagnosis: Unsteadiness on feet (R26.81);Other abnormalities of gait and mobility (R26.89);Muscle weakness (generalized) (M62.81);History of falling (Z91.81)   Activity Tolerance Patient limited by lethargy   Patient Left in bed;with call bell/phone within reach;with bed alarm set;with family/visitor present   Nurse Communication Mobility status        Time: 9604-5409 OT Time Calculation (min): 21 min  Charges: OT General Charges $OT Visit: 1 Visit OT Treatments $Self Care/Home Management : 8-22 mins  Derenda Mis, OTR/L Acute Rehabilitation  Services Office [phone] Secure Chat Communication Preferred   Donia Pounds 11/17/2023, 10:49 AM

## 2023-11-17 NOTE — Progress Notes (Signed)
Palliative Medicine Inpatient Follow Up Note HPI: 77yo with h/o dementia, HTN, and COPD who presented on 11/16 with a fall at home, was found to have a L-sided SDH and SAH. Neurosurgery consulted and recommended a repeat CT that showed enlarging SDH which has stabilized. She was started on Keppra with goal SBP <150. PT recommends SNF rehab.    Palliative care has been asked to get involved to continue goals of care conversation.   Today's Discussion 11/17/2023  *Please note that this is a verbal dictation therefore any spelling or grammatical errors are due to the "Dragon Medical One" system interpretation.  Chart reviewed inclusive of vital signs, progress notes, laboratory results, and diagnostic images. Patient has not been eating or drinking over the last two days.  A family meeting was held this afternoon in the presence of patients daughter, [name], [place] and son at bedside. We discussed patients poor overall health state in the setting of L sided SDH and subarachnoid hemorrhage.   We discussed at this time she appears to be declining. I shared that it would be of utility to consider code status given her ongoing deterioration. Patients daughter and son discussed that resuscitation at this time would be likely to cause Kandas more burden than benefit given her frailty. They agreed and feel at this time a DNAR/DNI are appropriate.   Patients daughter shares that she has continued to see her mother worsen over the last week. She shares that she does not feel optimistic that she will improve from her present condition. We discussed the options of focusing on comfort and allowing Shweta to pass away when it is her time naturally. [name] shares that she worries patient will be overmedicated. I provided education on the medications used during end of life and how they would not be utilized unless needed. I shared that she can take some time to consider this.   We discussed mIVF and how they are  not helpful in situations such as Lorrine' as they typically are short term solutions for a much larger problem. I shared the larger reality is that Shrinika does not feel the want, need, or desire to eat and drink. I shared that this is common at the end stage of dementia and I do believe her brain trauma may have exacerbated these symptoms.   Discussed that patient as she is presently cannot willingly participate in PT/OT and I worry her time at rehab will be short as she is likely to not participate.   Patients family would like her to be referred for inpatient hospice though [facility].   Questions and concerns addressed/Palliative Support Provided.   Objective Assessment: Vital Signs Vitals:   11/17/23 0408 11/17/23 1224  BP: (!) 147/66 (!) 123/59  Pulse: 89 91  Resp: 18 17  Temp: 97.8 F (36.6 C) (!) 97.5 F (36.4 C)  SpO2: 94% 95%    Intake/Output Summary (Last 24 hours) at 11/17/2023 1358 Last data filed at 11/16/2023 2026 Gross per 24 hour  Intake 60 ml  Output --  Net 60 ml   Last Weight  Most recent update: 11/17/2023  5:00 AM    Weight  52.6 kg (115 lb 15.4 oz)            Gen:  Frail elderly Caucasian F in NAD HEENT: moist mucous membranes CV: Regular rate and rhythm  PULM: On RA, breathing is even and nonlabored ABD: soft/nontender  EXT: No edema  Neuro: Opens eyes, minimally  vocal  SUMMARY OF RECOMMENDATIONS   DNAR/DNI  Referral to inpatient hospice through [facility]  Open and honest conversations held in the setting of patients clinical state and her decline(s)  Discussed focusing on patients comfort and dignity at this phase of her life  Ongoing PMT support  MDM: High - Time 26 ______________________________________________________________________________________ Lamarr Lulas [place] Palliative Medicine Team Team Cell Phone: [phone] Please utilize secure chat with additional questions, if there is no  response within 30 minutes please call the above phone number  Palliative Medicine Team providers are available by phone from 7am to 7pm daily and can be reached through the team cell phone.  Should this patient require assistance outside of these hours, please call the patient's attending physician.

## 2023-11-17 NOTE — Discharge Summary (Signed)
Physician Discharge Summary  Katie Jackson ZOX:[id number] DOB: 1945-12-23 DOA: 11/04/2023  PCP: Georgann Housekeeper, MD  Admit date: 11/04/2023 Discharge date: 11/17/2023  Admitted From: Home Disposition:  Residential hospice    Recommendations for Outpatient Follow-up:  Follow up with PCP in 1-2 weeks Please obtain BMP/CBC in one week   Home Health:No  Equipment/Devices:None   Discharge Condition:Stable  CODE STATUS:Full  Diet recommendation: Heart Healthy   Brief/Interim Summary:  77 y.o. female past medical history significant for Alzheimer's dementia behavioral disturbance essential hypertension COPD brought into the ED after fall at home with the head showed left-sided subdural hematoma 9 mm size and subarachnoid hemorrhage no midline shift.  ED spoke with neurosurgery who recommended to repeat a CT scan that showed enlarging subdural hematoma.   Discharge Diagnoses:  Principal Problem:   SDH (subdural hematoma) (HCC) Active Problems:   Dementia with behavioral disturbance (HCC)   HTN (hypertension)   Stage 3a chronic kidney disease (CKD) (HCC)   Malnutrition of moderate degree  Traumatic left-sided subdural hematoma and subarachnoid hemorrhage: Neurosurgery was consulted and recommended Keppra for 7 days. Repeated CT scan of the head showed a stable subdural hematoma. Neurosurgery also recommended to keep the blood pressure less than 150/90. They also recommend to repeat a CT scan in 2 weeks. The patient was lingering and was not improving and not consuming enough to sustain herself. Palliative care was consulted and after several days of conversation the family decided to move towards hospice. And she will go to a residential hospice facility.  Acute metabolic encephalopathy: Likely due to polypharmacy and subdural hematoma.  Dementia with behavioral disturbances: All antipsychotics and sedatives were discontinued.  Essential  hypertension: Noted Chronic kidney disease stage IIIa: Noted   Discharge Instructions  Discharge Instructions     Diet - low sodium heart healthy   Complete by: As directed    Increase activity slowly   Complete by: As directed       Allergies as of 11/17/2023       Reactions   Penicillins Other (See Comments)   From childhood; reaction not recalled.        Medication List     STOP taking these medications    amLODipine 10 MG tablet Commonly known as: NORVASC   donepezil 10 MG tablet Commonly known as: ARICEPT   QUEtiapine 50 MG tablet Commonly known as: SEROQUEL        Follow-up Information     [facility] Follow up.   Specialty: Home Health Services Why: The home helalth agency will contact you for the first home visit. Contact information: [address] [place] [address] [phone]         [facility] Follow up.   Why: Call to get her on the CAPS list.  Ask about her medicaid covering other services. Contact information: [phone]               Allergies  Allergen Reactions   Penicillins Other (See Comments)    From childhood; reaction not recalled.    Consultations: Neurosurgery PMT   Procedures/Studies: CT HEAD WO CONTRAST ( )  Result Date: 11/09/2023 CLINICAL DATA:  Encephalopathy EXAM: CT HEAD WITHOUT CONTRAST TECHNIQUE: Contiguous axial images were obtained from the base of the skull through the vertex without intravenous contrast. RADIATION DOSE REDUCTION: This exam was performed according to the departmental dose-optimization program which includes automated exposure control, adjustment of the mA and/or kV according to patient  size and/or use of iterative reconstruction technique. COMPARISON:  Four days ago FINDINGS: Brain: Acute subdural hematoma along the left cerebral convexity measuring up to 12 mm in thickness at the mid sylvian fissure, unchanged. Regional  subarachnoid hemorrhage which is stable or diminished. No new abnormality. In the setting of brain atrophy there is no significant cerebral mass effect or midline shift. No evidence of infarct, hydrocephalus, or mass. Vascular: No hyperdense vessel or unexpected calcification. Skull: Left parietal scalp hematoma without calvarial fracture. Sinuses/Orbits: Bilateral cataract resection. IMPRESSION: No progression of subarachnoid and subdural hemorrhage on the left. No significant brain mass effect in the setting of atrophy. Electronically Signed   By: Tiburcio Pea M.D.   On: 11/09/2023 13:24   CT HEAD WO CONTRAST ( )  Result Date: 11/05/2023 CLINICAL DATA:  Follow-up subdural hematoma EXAM: CT HEAD WITHOUT CONTRAST TECHNIQUE: Contiguous axial images were obtained from the base of the skull through the vertex without intravenous contrast. RADIATION DOSE REDUCTION: This exam was performed according to the departmental dose-optimization program which includes automated exposure control, adjustment of the mA and/or kV according to patient size and/or use of iterative reconstruction technique. COMPARISON:  Earlier the same day at 12:06 FINDINGS: Brain: Subdural hematoma along the left cerebral convexity shows no interval enlargement, maximal thickness on coronal reformats measures 12 mm. Local subarachnoid hemorrhage along the sylvian fissure. No significant mass effect given underlying brain atrophy. No evidence of infarct, hydrocephalus, or mass lesion. Brain atrophy Vascular: No hyperdense vessel or unexpected calcification. Skull: Normal. Negative for fracture or focal lesion. Sinuses/Orbits: No acute finding. IMPRESSION: Unchanged subdural hematoma on the left with local subarachnoid hemorrhage. Maximal subdural thickness is 12 mm but mass effect is mild in the setting of brain atrophy. No interval progression. Electronically Signed   By: Tiburcio Pea M.D.   On: 11/05/2023 06:23   CT HEAD WO CONTRAST  ( )  Result Date: 11/05/2023 CLINICAL DATA:  Subdural hematoma EXAM: CT HEAD WITHOUT CONTRAST TECHNIQUE: Contiguous axial images were obtained from the base of the skull through the vertex without intravenous contrast. RADIATION DOSE REDUCTION: This exam was performed according to the departmental dose-optimization program which includes automated exposure control, adjustment of the mA and/or kV according to patient size and/or use of iterative reconstruction technique. COMPARISON:  11/04/2023 FINDINGS: Brain: Enlarging acute left subdural hematoma, measuring up to 12 mm in thickness compared to 9 mm previously. Subarachnoid blood again noted in the sylvian fissure and adjacent sulci, stable. No midline shift. No acute infarction or hydrocephalus. Vascular: No hyperdense vessel or unexpected calcification. Skull: No acute calvarial abnormality. Sinuses/Orbits: No acute findings Other: Large left parietooccipital scalp hematoma. This is unchanged. IMPRESSION: Stable left subarachnoid hemorrhage. Enlarging left subdural hematoma, now 12 mm in thickness. No midline shift. Electronically Signed   By: Charlett Nose M.D.   On: 11/05/2023 00:18   CT Head Wo Contrast  Result Date: 11/04/2023 CLINICAL DATA:  Head trauma, intracranial arterial injury suspected; Neck trauma (Age >= 65y). Fall. EXAM: CT HEAD WITHOUT CONTRAST CT CERVICAL SPINE WITHOUT CONTRAST TECHNIQUE: Multidetector CT imaging of the head and cervical spine was performed following the standard protocol without intravenous contrast. Multiplanar CT image reconstructions of the cervical spine were also generated. RADIATION DOSE REDUCTION: This exam was performed according to the departmental dose-optimization program which includes automated exposure control, adjustment of the mA and/or kV according to patient size and/or use of iterative reconstruction technique. COMPARISON:  Head CT 06/11/2023 FINDINGS: CT HEAD FINDINGS Brain: Small volume acute  subarachnoid hemorrhage is present in the left sylvian fissure and adjacent sulci. A small acute subdural hematoma over the left cerebral convexity measures up to 9 mm in thickness without midline shift or other significant mass effect. No parenchymal hemorrhage or acute infarct is evident. There is mild cerebral atrophy. Hypodensities in the cerebral white matter are similar to the prior study and are nonspecific but compatible with mild chronic small vessel ischemic disease. Vascular: Calcified atherosclerosis at the skull base. Skull: No acute fracture or suspicious osseous lesion. Sinuses/Orbits: Paranasal sinuses and mastoid air cells are clear. Bilateral cataract extraction. Other: Large left parieto-occipital scalp hematoma. CT CERVICAL SPINE FINDINGS Alignment: Minimal anterolisthesis of C3 on C4, likely degenerative. Skull base and vertebrae: No acute fracture or destructive lesion. Diffuse osteopenia. Subcentimeter sclerotic focus in the C7 vertebral body, likely a bone island. Soft tissues and spinal canal: No prevertebral fluid or swelling. No visible canal hematoma. Disc levels: Mild-to-moderate multilevel disc degeneration. Advanced facet arthrosis at C2-3 and C3-4. Moderate bilateral neural foraminal stenosis at C5-6 and C6-7. No evidence of high-grade spinal stenosis. Upper chest: Scarring in the lung apices. Other: Heavy atherosclerotic calcification of the carotid bifurcations. Critical Value/emergent results were called by telephone at the time of interpretation on 11/04/2023 at 6:13 pm to Dr. Bethann Berkshire, who verbally acknowledged these results. IMPRESSION: 1. Acute left-sided subdural hematoma and subarachnoid hemorrhage. No midline shift. 2. No acute cervical spine fracture. Electronically Signed   By: Sebastian Ache M.D.   On: 11/04/2023 18:14   CT Cervical Spine Wo Contrast  Result Date: 11/04/2023 CLINICAL DATA:  Head trauma, intracranial arterial injury suspected; Neck trauma (Age >=  65y). Fall. EXAM: CT HEAD WITHOUT CONTRAST CT CERVICAL SPINE WITHOUT CONTRAST TECHNIQUE: Multidetector CT imaging of the head and cervical spine was performed following the standard protocol without intravenous contrast. Multiplanar CT image reconstructions of the cervical spine were also generated. RADIATION DOSE REDUCTION: This exam was performed according to the departmental dose-optimization program which includes automated exposure control, adjustment of the mA and/or kV according to patient size and/or use of iterative reconstruction technique. COMPARISON:  Head CT 06/11/2023 FINDINGS: CT HEAD FINDINGS Brain: Small volume acute subarachnoid hemorrhage is present in the left sylvian fissure and adjacent sulci. A small acute subdural hematoma over the left cerebral convexity measures up to 9 mm in thickness without midline shift or other significant mass effect. No parenchymal hemorrhage or acute infarct is evident. There is mild cerebral atrophy. Hypodensities in the cerebral white matter are similar to the prior study and are nonspecific but compatible with mild chronic small vessel ischemic disease. Vascular: Calcified atherosclerosis at the skull base. Skull: No acute fracture or suspicious osseous lesion. Sinuses/Orbits: Paranasal sinuses and mastoid air cells are clear. Bilateral cataract extraction. Other: Large left parieto-occipital scalp hematoma. CT CERVICAL SPINE FINDINGS Alignment: Minimal anterolisthesis of C3 on C4, likely degenerative. Skull base and vertebrae: No acute fracture or destructive lesion. Diffuse osteopenia. Subcentimeter sclerotic focus in the C7 vertebral body, likely a bone island. Soft tissues and spinal canal: No prevertebral fluid or swelling. No visible canal hematoma. Disc levels: Mild-to-moderate multilevel disc degeneration. Advanced facet arthrosis at C2-3 and C3-4. Moderate bilateral neural foraminal stenosis at C5-6 and C6-7. No evidence of high-grade spinal stenosis.  Upper chest: Scarring in the lung apices. Other: Heavy atherosclerotic calcification of the carotid bifurcations. Critical Value/emergent results were called by telephone at the time of interpretation on 11/04/2023 at 6:13 pm to Dr. Bethann Berkshire, who verbally acknowledged these  results. IMPRESSION: 1. Acute left-sided subdural hematoma and subarachnoid hemorrhage. No midline shift. 2. No acute cervical spine fracture. Electronically Signed   By: Sebastian Ache M.D.   On: 11/04/2023 18:14   (Echo, Carotid, EGD, Colonoscopy, ERCP)    Subjective: No complains  Discharge Exam: Vitals:   11/17/23 0408 11/17/23 1224  BP: (!) 147/66 (!) 123/59  Pulse: 89 91  Resp: 18 17  Temp: 97.8 F (36.6 C) (!) 97.5 F (36.4 C)  SpO2: 94% 95%   Vitals:   11/17/23 0029 11/17/23 0408 11/17/23 0500 11/17/23 1224  BP: (!) 145/68 (!) 147/66  (!) 123/59  Pulse: 90 89  91  Resp:  18  17  Temp:  97.8 F (36.6 C)  (!) 97.5 F (36.4 C)  TempSrc:  Oral  Oral  SpO2:  94%  95%  Weight:   52.6 kg   Height:        General: Pt is alert, awake, not in acute distress Cardiovascular: RRR, S1/S2 +, no rubs, no gallops Respiratory: CTA bilaterally, no wheezing, no rhonchi Abdominal: Soft, NT, ND, bowel sounds + Extremities: no edema, no cyanosis    The results of significant diagnostics from this hospitalization (including imaging, microbiology, ancillary and laboratory) are listed below for reference.     Microbiology: No results found for this or any previous visit (from the past 240 hour(s)).   Labs: BNP (last 3 results) No results for input(s): "BNP" in the last 8760 hours. Basic Metabolic Panel: Recent Labs  Lab 11/12/23 0428 11/14/23 0457 11/15/23 0551  NA 134* 133* 134*  K 4.0 4.0 3.9  CL 106 105 104  CO2 19* 20* 22  GLUCOSE 107* 107* 113*  BUN 18 16 19   CREATININE 0.98 1.07* 1.12*  CALCIUM 9.0 9.2 9.1   Liver Function Tests: No results for input(s): "AST", "ALT", "ALKPHOS", "BILITOT",  "PROT", "ALBUMIN" in the last 168 hours. No results for input(s): "LIPASE", "AMYLASE" in the last 168 hours. Recent Labs  Lab 11/15/23 0551  AMMONIA 15   CBC: Recent Labs  Lab 11/11/23 0841 11/14/23 0457  WBC 7.9 9.8  NEUTROABS 4.3 6.2  HGB 11.0* 11.2*  HCT 34.8* 34.5*  MCV 77.2* 77.5*  PLT 257 343   Cardiac Enzymes: No results for input(s): "CKTOTAL", "CKMB", "CKMBINDEX", "TROPONINI" in the last 168 hours. BNP: Invalid input(s): "POCBNP" CBG: Recent Labs  Lab 11/11/23 1134 11/16/23 2128  GLUCAP 98 124*   D-Dimer No results for input(s): "DDIMER" in the last 72 hours. Hgb A1c No results for input(s): "HGBA1C" in the last 72 hours. Lipid Profile No results for input(s): "CHOL", "HDL", "LDLCALC", "TRIG", "CHOLHDL", "LDLDIRECT" in the last 72 hours. Thyroid function studies No results for input(s): "TSH", "T4TOTAL", "T3FREE", "THYROIDAB" in the last 72 hours.  Invalid input(s): "FREET3" Anemia work up No results for input(s): "VITAMINB12", "FOLATE", "FERRITIN", "TIBC", "IRON", "RETICCTPCT" in the last 72 hours. Urinalysis    Component Value Date/Time   COLORURINE YELLOW 11/07/2023 0354   APPEARANCEUR HAZY (A) 11/07/2023 0354   LABSPEC 1.010 11/07/2023 0354   PHURINE 5.0 11/07/2023 0354   GLUCOSEU NEGATIVE 11/07/2023 0354   HGBUR MODERATE (A) 11/07/2023 0354   BILIRUBINUR NEGATIVE 11/07/2023 0354   KETONESUR NEGATIVE 11/07/2023 0354   PROTEINUR NEGATIVE 11/07/2023 0354   NITRITE POSITIVE (A) 11/07/2023 0354   LEUKOCYTESUR LARGE (A) 11/07/2023 0354   Sepsis Labs Recent Labs  Lab 11/11/23 0841 11/14/23 0457  WBC 7.9 9.8   Microbiology No results found for this or any  previous visit (from the past 240 hour(s)).   Time coordinating discharge: Over 30 minutes  SIGNED:   Marinda Elk, MD  [facility] 11/17/2023, 7:09 PM Pager   If 7PM-7AM, please contact night-coverage [email or web address] Password [email or web address]

## 2023-11-17 NOTE — Progress Notes (Signed)
Patient only had 2 spoonfuls of magic cup for breakfast.

## 2023-11-17 NOTE — Plan of Care (Signed)
  Problem: Safety: Goal: Non-violent Restraint(s) Outcome: Progressing   Problem: Activity: Goal: Risk for activity intolerance will decrease Outcome: Progressing   Problem: Coping: Goal: Level of anxiety will decrease Outcome: Progressing   Problem: Elimination: Goal: Will not experience complications related to bowel motility Outcome: Progressing Goal: Will not experience complications related to urinary retention Outcome: Progressing

## 2023-12-20 DEATH — deceased

## 2024-04-04 ENCOUNTER — Telehealth: Payer: Self-pay | Admitting: Physician Assistant

## 2024-04-04 NOTE — Telephone Encounter (Signed)
 Pt's daughter called in after getting an appointment reminder. She stated the patient passed away 2023/12/11.

## 2024-04-11 ENCOUNTER — Ambulatory Visit: Payer: 59 | Admitting: Physician Assistant
